# Patient Record
Sex: Female | Born: 1985 | Race: White | Hispanic: No | Marital: Single | Smoking: Never smoker
Health system: Southern US, Community
[De-identification: ages and names within clinical notes are randomized; demographics above are authoritative.]

## PROBLEM LIST (undated history)

## (undated) DIAGNOSIS — Z8619 Personal history of other infectious and parasitic diseases: Secondary | ICD-10-CM

## (undated) DIAGNOSIS — I951 Orthostatic hypotension: Secondary | ICD-10-CM

## (undated) DIAGNOSIS — I498 Other specified cardiac arrhythmias: Secondary | ICD-10-CM

## (undated) DIAGNOSIS — G90A Postural orthostatic tachycardia syndrome (POTS): Secondary | ICD-10-CM

## (undated) DIAGNOSIS — R000 Tachycardia, unspecified: Secondary | ICD-10-CM

## (undated) DIAGNOSIS — I499 Cardiac arrhythmia, unspecified: Secondary | ICD-10-CM

## (undated) HISTORY — PX: WISDOM TOOTH EXTRACTION: SHX21

## (undated) HISTORY — DX: Tachycardia, unspecified: R00.0

## (undated) HISTORY — DX: Cardiac arrhythmia, unspecified: I49.9

## (undated) HISTORY — DX: Other specified cardiac arrhythmias: I49.8

## (undated) HISTORY — DX: Postural orthostatic tachycardia syndrome (POTS): G90.A

## (undated) HISTORY — DX: Personal history of other infectious and parasitic diseases: Z86.19

## (undated) HISTORY — DX: Orthostatic hypotension: I95.1

---
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
## 2013-01-28 ENCOUNTER — Encounter: Payer: Self-pay | Admitting: Family

## 2013-01-28 ENCOUNTER — Ambulatory Visit (INDEPENDENT_AMBULATORY_CARE_PROVIDER_SITE_OTHER): Payer: BC Managed Care – PPO | Admitting: Family

## 2013-01-28 VITALS — BP 104/70 | HR 69 | Temp 98.1°F | Resp 16 | Ht 66.0 in | Wt 116.1 lb

## 2013-01-28 DIAGNOSIS — Z0000 Encounter for general adult medical examination without abnormal findings: Secondary | ICD-10-CM

## 2013-01-28 DIAGNOSIS — I499 Cardiac arrhythmia, unspecified: Secondary | ICD-10-CM

## 2013-01-28 DIAGNOSIS — R000 Tachycardia, unspecified: Secondary | ICD-10-CM

## 2013-01-28 DIAGNOSIS — G90A Postural orthostatic tachycardia syndrome (POTS): Secondary | ICD-10-CM | POA: Insufficient documentation

## 2013-01-28 DIAGNOSIS — I951 Orthostatic hypotension: Secondary | ICD-10-CM

## 2013-01-28 MED ORDER — DROSPIRENONE-ETHINYL ESTRADIOL 3-0.02 MG PO TABS: 1.0000 | ORAL_TABLET | Freq: Every day | ORAL | Status: DC

## 2013-01-28 NOTE — Assessment & Plan Note (Signed)
EKG performed today notes PAC's and incomplete RBBB. Will refer to cardiology for further evaluation.   Request old records from [facility] cardiology.

## 2013-01-28 NOTE — Progress Notes (Signed)
Subjective:    Patient ID: Debbie [name], female    DOB: June 12, 1985, 27 y.o.   MRN: [id number]  HPI  Debbie [name] is a 27 yr old female who presents today to establish care.  She is originally from [facility], but relocated from [place] where she completed PhD in philosophy. 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 She has hx of Postural orthostatic tachycardia syndrome.  This was originally diagnosed in 2011. She reports that she had an extensive evaluation performed at [facility] and was ultimately placed on 4 different medications. Reports that the medications caused her to have such severe side effects and somnolence that she ultimately did not feel that the benefits were worth the side effects.  She has been managing without meds now for some time. Notes occasional dizzinness/weakness with standing. Has increased fatigue and need for rest.  Notes that sometimes she must sit down for periods of time.  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  She is interested in establishing with a local cardiologist. She would also like to have a physical today.  Preventative care:  Immunizations: declines flu shot.   Diet: reports a healthy diet Exercise: tries to exericise 3 x a week run/walk Pap Smear: 1 yr ago- normal.  Reports that it was done by pcp. Reports no hx of abnormal pap.    Review of Systems  Constitutional: Negative for unexpected weight change.  HENT: Negative for hearing loss and rhinorrhea.   Eyes: Negative for visual disturbance.  Respiratory: Negative for cough and shortness of breath.   Cardiovascular: Negative for chest pain.  Gastrointestinal: Negative for vomiting, diarrhea and constipation.  Genitourinary: Negative for dysuria and frequency.  Musculoskeletal: Negative for arthralgias and myalgias.  Skin: Negative for rash.       Dry sensitive skin  Neurological:  Negative for headaches.  Hematological: Negative for adenopathy.  Psychiatric/Behavioral:       Denies depression/anxiety   Past Medical History  Diagnosis Date  . History of chicken pox   . POTS (postural orthostatic tachycardia syndrome)     History   Social History  . Marital Status: Single    Spouse Name: N/A    Number of Children: N/A  . Years of Education: N/A   Occupational History  . Not on file.   Social History Main Topics  . Smoking status: Never Smoker   . Smokeless tobacco: Never Used  . Alcohol Use: No  . Drug Use: Not on file  . Sexual Activity: Not on file   Other Topics Concern  . 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 Tonsillectomy and adenoidectomy  1997    Family History  Problem Relation Age of Onset  . Hyperlipidemia Father   . Heart disease Father     CAD/defibrillator age 70  . Hypertension Father   . Diabetes Father     type 2    No Known Allergies  No current outpatient prescriptions on file prior to visit.   No current facility-administered medications on file prior to visit.    BP 104/70  Pulse 69  Temp(Src) 98.1 F (36.7 C) (Oral)  Resp 16  Ht 5\' 6"  (1.676 m)  Wt 116 lb 1.3 oz (52.654 kg)  BMI 18.74 kg/m2  SpO2 99%  LMP 01/16/2013        Objective:   Physical Exam  Constitutional: She is oriented to person, place, and time. She appears well-developed and well-nourished. No distress.  HENT:  Head: Normocephalic and atraumatic.  Right Ear: Tympanic membrane and ear canal normal.  Left Ear: Tympanic membrane and ear canal normal.  Mouth/Throat: No oropharyngeal exudate, posterior oropharyngeal edema, posterior oropharyngeal erythema or tonsillar abscesses.  Cardiovascular: Normal rate.  An irregular rhythm present.  No murmur  heard. Pulmonary/Chest: Breath sounds normal. No respiratory distress. She has no wheezes. She has no rales. She exhibits no tenderness.  Genitourinary:  Bilateral breast exam is normal without discharge, masses noted.  Musculoskeletal: She exhibits no edema.  Lymphadenopathy:    She has no cervical adenopathy.  Neurological: She is alert and oriented to person, place, and time.  Skin: Skin is warm and dry.  Psychiatric: She has a normal mood and affect. Her behavior is normal. Judgment and thought content normal.          Assessment & Plan:

## 2013-01-28 NOTE — Assessment & Plan Note (Signed)
Continue healthy diet, exercise.  She will return for labs. She is afraid of risk of fainting if she comes fasting and I have told her that it is ok for her to complete non-fasting.

## 2013-01-28 NOTE — Patient Instructions (Addendum)
Please return to the lab at your convenience. You do not need to be fasting. Follow up in 1 year.

## 2013-02-04 ENCOUNTER — Ambulatory Visit: Payer: BC Managed Care – PPO | Admitting: Cardiovascular Disease

## 2013-02-11 ENCOUNTER — Encounter: Payer: Self-pay | Admitting: Cardiovascular Disease

## 2013-02-11 ENCOUNTER — Ambulatory Visit (INDEPENDENT_AMBULATORY_CARE_PROVIDER_SITE_OTHER): Payer: BC Managed Care – PPO | Admitting: Cardiovascular Disease

## 2013-02-11 ENCOUNTER — Telehealth: Payer: Self-pay | Admitting: Cardiovascular Disease

## 2013-02-11 VITALS — BP 110/82 | HR 54 | Ht 66.0 in | Wt 117.5 lb

## 2013-02-11 DIAGNOSIS — R000 Tachycardia, unspecified: Secondary | ICD-10-CM

## 2013-02-11 DIAGNOSIS — I951 Orthostatic hypotension: Secondary | ICD-10-CM

## 2013-02-11 NOTE — Progress Notes (Signed)
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 She was referred by Dr. Sandford Craze, her primary care physician, to be established in our practice because of postural orthostatic tachycardia syndrome. She was diagnosed at this age 27. She's had a positive tilt table test, echocardiogram and event monitoring. She does complain of presyncope but has not had true syncope in the past. She is feeling symptomatically daily basis with tachypalpitations, chronic fatigue and presyncope. She has been on Florinef, beta blocker and Lexapro in the past which improved her symptoms over the side effects from the medications were intolerable as well and Currently she is on no medications.   Current Outpatient Prescriptions  Medication Sig Dispense Refill  . drospirenone-ethinyl estradiol (YAZ,GIANVI,LORYNA) 3-0.02 MG tablet Take 1 tablet by mouth daily.  1 Package  11   No current facility-administered medications for this visit.    No Known Allergies  History   Social History  . Marital Status: Single    Spouse Name: N/A    Number of Children: N/A  . Years of Education: N/A   Occupational History  . Not on file.   Social History Main Topics  . Smoking status: Never Smoker   . Smokeless tobacco: Never Used  . Alcohol Use: No  . Drug Use: Not on file  . Sexual Activity: Not on file   Other Topics Concern  . 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  Cardiovascular: negative for  chest pain, dyspnea on exertion, edema, orthopnea, palpitations, paroxysmal nocturnal dyspnea or shortness of breath Dermatological: negative for rash Respiratory: negative for cough or wheezing Urologic: negative for hematuria Abdominal: negative for nausea, vomiting, diarrhea, bright red blood per rectum, melena, or hematemesis Neurologic: negative for visual changes, syncope, or dizziness All other systems reviewed and are otherwise negative except as noted above.    Blood pressure 110/82, pulse 54, height 5\' 6"  (1.676 m), weight 117 lb 8 oz (53.298 kg), last menstrual period 01/16/2013.  General appearance: alert and no distress Neck: no adenopathy, no carotid bruit, no JVD, supple, symmetrical, trachea midline and thyroid not enlarged, symmetric, no tenderness/mass/nodules Lungs: clear to auscultation bilaterally Heart: regular rate and rhythm, S1, S2 normal, no murmur, click, rub or gallop Extremities: extremities normal, atraumatic, no cyanosis or edema  EKG sinus bradycardia at 54 with incomplete right bundle branch block and left posterior fascicular block.  ASSESSMENT AND PLAN:   POTS (postural orthostatic tachycardia syndrome) Ms.  Weider  is a 27 year old fit and healthy appearing single Caucasian female who is working as a Engineer, petroleum at [employer]. She recently relocated from [place] where she received her PhD in philosophy from [employer]. She has a diagnosis of postural orthostatic tachycardia syndrome since age 27. She has had a positive tilt table test, echocardiography and Holter monitoring. She has been on medications in the past which he did not tolerate including Florinef, blocker and SSRI. She currently is on no medications.  She gets frequent symptoms including tachycardia palpitations and presyncope but has never actually had true syncope. Because of her debility and her fairly uncommon disease process I am referring her to Dr. Aron Baba , at [facility]  runs there [facility] . Clinic.      Runell Gess MD FACP,FACC,FAHA, [facility] 02/11/2013 9:44 AM

## 2013-02-11 NOTE — Patient Instructions (Signed)
Dr Allyson Sabal will see you back only as needed.  We have referred you to the POTS clinic at [facility].

## 2013-02-11 NOTE — Assessment & Plan Note (Addendum)
Debbie [name]  is a 27 year old fit and healthy appearing single Caucasian female who is working as a Engineer, petroleum at [employer]. She recently relocated from [place] where she received her PhD in philosophy from [employer]. She has a diagnosis of postural orthostatic tachycardia syndrome since age 21. She has had a positive tilt table test, echocardiography and Holter monitoring. She has been on medications in the past which he did not tolerate including Florinef, blocker and SSRI. She currently is on no medications. She gets frequent symptoms including tachycardia palpitations and presyncope but has never actually had true syncope. Because of her debility and her fairly uncommon disease process I am referring her to Dr. Aron Baba , at [facility]  runs there [facility] . Clinic.

## 2013-02-11 NOTE — Telephone Encounter (Signed)
Scheduled appt for patient in the [facility] for February 3 @ 10:00 with dr. Narda Amber. Lft msg for pt with appt time.  [facility] will mail her information,

## 2014-01-13 ENCOUNTER — Other Ambulatory Visit (HOSPITAL_COMMUNITY)
Admission: RE | Admit: 2014-01-13 | Discharge: 2014-01-13 | Disposition: A | Discharge status: Home / Self Care | Payer: BC Managed Care – PPO | Source: Ambulatory Visit | Attending: Family | Admitting: Family

## 2014-01-13 ENCOUNTER — Encounter: Payer: Self-pay | Admitting: Family

## 2014-01-13 ENCOUNTER — Other Ambulatory Visit: Payer: Self-pay | Admitting: Family

## 2014-01-13 ENCOUNTER — Ambulatory Visit (INDEPENDENT_AMBULATORY_CARE_PROVIDER_SITE_OTHER): Payer: BC Managed Care – PPO | Admitting: Family

## 2014-01-13 VITALS — BP 110/80 | HR 68 | Temp 98.1°F | Resp 16 | Ht 66.0 in | Wt 117.4 lb

## 2014-01-13 DIAGNOSIS — Z01419 Encounter for gynecological examination (general) (routine) without abnormal findings: Secondary | ICD-10-CM

## 2014-01-13 DIAGNOSIS — G90A Postural orthostatic tachycardia syndrome (POTS): Secondary | ICD-10-CM

## 2014-01-13 DIAGNOSIS — Z0000 Encounter for general adult medical examination without abnormal findings: Secondary | ICD-10-CM

## 2014-01-13 DIAGNOSIS — Z23 Encounter for immunization: Secondary | ICD-10-CM

## 2014-01-13 DIAGNOSIS — Z1151 Encounter for screening for human papillomavirus (HPV): Secondary | ICD-10-CM | POA: Insufficient documentation

## 2014-01-13 DIAGNOSIS — Z01411 Encounter for gynecological examination (general) (routine) with abnormal findings: Secondary | ICD-10-CM | POA: Diagnosis present

## 2014-01-13 DIAGNOSIS — I951 Orthostatic hypotension: Secondary | ICD-10-CM

## 2014-01-13 DIAGNOSIS — R000 Tachycardia, unspecified: Secondary | ICD-10-CM

## 2014-01-13 MED ORDER — DROSPIRENONE-ETHINYL ESTRADIOL 3-0.02 MG PO TABS: 1.0000 | ORAL_TABLET | Freq: Every day | ORAL | Status: DC

## 2014-01-13 NOTE — Assessment & Plan Note (Signed)
Continue healthy diet, exercise. Declines lab work today.  Flu shot today.

## 2014-01-13 NOTE — Progress Notes (Signed)
Pre visit review using our clinic review tool, if applicable. No additional management support is needed unless otherwise documented below in the visit note. 

## 2014-01-13 NOTE — Assessment & Plan Note (Signed)
Seems to be doing better on ritalin- this is being prescribed by sleep specialist.

## 2014-01-13 NOTE — Patient Instructions (Signed)
Please follow up in 1 year, sooner if problems/concerns.  

## 2014-01-13 NOTE — Progress Notes (Signed)
Subjective:    Patient ID: Debbie [name], female    DOB: November 30, 1985, 30 y.o.   MRN: [id number]  HPI  Pt here for non-fasting physical. Pt up to date with tetanus. Undecided about flu vaccine. Would like pap smeOvidio Hangerar today. Last EKG by us 01/28/13.   POTS- saw Dr. Allyson SabalBerry, [facility]   [facility].  Went in March and saw Dr. Lytle MichaelsAdam Perlman.  Recommended acupuncture- was healthy.  [facility]  He recommended stimulant to keep her awake.  Did not start.  Reports that when she returned to work. Reports less dizzy, less brain fog.  Feels like she is funcitoning better.  Dr.  Quintella ReichertHoly Forrester Miler.  Working on removing anxiety.   Patient presents today for complete physical.  Immunizations: tetanus up to date Diet: healthy Exercise: not exercising regularly due to POTS Pap Smear: 2 years ago Mammogram:     Review of Systems  Constitutional:       Wt Readings from Last 3 Encounters: 01/13/14 : 117 lb 6.4 oz (53.252 kg) 02/11/13 : 117 lb 8 oz (53.298 kg) 01/28/13 : 116 lb 1.3 oz (52.654 kg)   HENT: Positive for rhinorrhea. Negative for hearing loss.   Eyes: Negative for visual disturbance.  Respiratory: Negative for cough and shortness of breath.   Cardiovascular: Negative for chest pain.  Gastrointestinal:       Mild nausea associated with pots  Genitourinary: Negative for dysuria, frequency and menstrual problem.  Musculoskeletal: Negative for arthralgias and myalgias.  Neurological: Negative for headaches.  Hematological: Negative for adenopathy.  Psychiatric/Behavioral:       Denies depression   Past Medical History  Diagnosis Date  . History of chicken pox   . POTS (postural orthostatic tachycardia syndrome)     History   Social History  . Marital Status: Single    Spouse Name: N/A    Number of Children: N/A  . Years of Education: N/A   Occupational History  . Not on file.   Social History Main  Topics  . Smoking status: Never Smoker   . Smokeless tobacco: Never Used  . Alcohol Use: No  . Drug Use: Not on file  . Sexual Activity: Not on file   Other Topics Concern  . 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 Tonsillectomy and adenoidectomy  1997    Family History  Problem Relation Age of Onset  . Hyperlipidemia Father   . Heart disease Father     CAD/defibrillator age 30  . Hypertension Father   . Diabetes Father     type 2    No Known Allergies  Current Outpatient Prescriptions on File Prior to Visit  Medication Sig Dispense Refill  . drospirenone-ethinyl estradiol (YAZ,GIANVI,LORYNA) 3-0.02 MG tablet Take 1 tablet by mouth daily.  1 Package  11   No current facility-administered medications on file prior to visit.    BP 110/80  Pulse 68  Temp(Src) 98.1 F (36.7 C) (Oral)  Resp 16  Ht 5\' 6"  (1.676 m)  Wt 117 lb 6.4 oz (53.252 kg)  BMI 18.96 kg/m2  SpO2 100%  LMP 12/16/2013        Objective:  Physical Exam Physical Exam  Constitutional: She is oriented to person, place, and time. She appears well-developed and well-nourished. No distress.  HENT:  Head: Normocephalic and atraumatic.  Right Ear: Tympanic membrane and ear canal normal.  Left Ear: Tympanic membrane and ear canal normal.  Mouth/Throat: Oropharynx is clear and moist.  Eyes: Pupils are equal, round, and reactive to light. No scleral icterus.  Neck: Normal range of motion. No thyromegaly present.  Cardiovascular: Normal rate and regular rhythm.   No murmur heard. Pulmonary/Chest: Effort normal and breath sounds normal. No respiratory distress. He has no wheezes. She has no rales. She exhibits no tenderness.  Abdominal: Soft. Bowel sounds are normal. He exhibits no distension and no mass. There is no  tenderness. There is no rebound and no guarding.  Musculoskeletal: She exhibits no edema.  Lymphadenopathy:    She has no cervical adenopathy.  Neurological: She is alert and oriented to person, place, and time. She has normal reflexes. She exhibits normal muscle tone. Coordination normal.  Skin: Skin is warm and dry.  Psychiatric: She has a normal mood and affect. Her behavior is normal. Judgment and thought content normal.  Breasts: Examined lying Right: Without masses, retractions, discharge or axillary adenopathy.  Left: Without masses, retractions, discharge or axillary adenopathy.  Inguinal/mons: Normal without inguinal adenopathy  External genitalia: Normal  BUS/Urethra/Skene's glands: Normal  Bladder: Normal  Vagina: Normal  Cervix: Normal  Uterus: normal in size, shape and contour. Midline and mobile  Adnexa/parametria:  Rt: Without masses or tenderness.  Lt: Without masses or tenderness.  Anus and perineum: Normal           Assessment & Plan:          Assessment & Plan:

## 2014-01-13 NOTE — Addendum Note (Signed)
Addended by: Mervin KungFERGERSON, Annelyse Rey A on: 01/13/2014 04:13 PM   Modules accepted: Orders

## 2014-01-15 LAB — CYTOLOGY - PAP

## 2014-01-17 ENCOUNTER — Encounter: Payer: Self-pay | Admitting: Family

## 2014-01-17 DIAGNOSIS — R87610 Atypical squamous cells of undetermined significance on cytologic smear of cervix (ASC-US): Secondary | ICD-10-CM | POA: Insufficient documentation

## 2014-01-22 ENCOUNTER — Telehealth: Payer: Self-pay | Admitting: Family

## 2014-01-22 NOTE — Telephone Encounter (Signed)
Pt returing call. See below.   Notes Recorded by Wende MottGaye H Foster, RN on 01/22/2014 at 11:14 AM LM for patient to return the call Notes Recorded by Kathi Simpersricia A Fergerson, CMA on 01/20/2014 at 7:14 PM Left message for pt to return my call. Notes Recorded by Sandford CrazeMelissa O'Sullivan, NP on 01/17/2014 at 4:13 PM Please contact pt and let her know that her pap is abnormal, however HPV testing is negative which is good. She should repeat her Pap in 1 year

## 2014-01-22 NOTE — Telephone Encounter (Signed)
A user error has taken place.

## 2014-01-22 NOTE — Telephone Encounter (Signed)
Advised patient per PCP notes. Patient voices understanding.

## 2014-01-24 ENCOUNTER — Telehealth: Payer: Self-pay | Admitting: *Deleted

## 2014-01-24 NOTE — Telephone Encounter (Signed)
Left detailed message below # and to call if any questions.     Jonathon JordanStephanie S Stevens Tesha Archambeau A Anis Degidio, CMA            Patient states that she is returning your phone call from a couple of days ago.[phone]   She states that it is ok to leave a detailed message.

## 2014-01-24 NOTE — Telephone Encounter (Signed)
Message copied by Kathi SimpersFERGERSON, Callin Ashe A on Fri Jan 24, 2014  7:50 AM ------      Message from: O'SULLIVAN, MELISSA      Created: Fri Jan 17, 2014  4:13 PM       Please contact pt and let her know that her pap is abnormal, however HPV testing is negative which is good. She should repeat her Pap in 1 year. ------

## 2014-12-12 ENCOUNTER — Telehealth: Payer: Self-pay | Admitting: *Deleted

## 2014-12-12 NOTE — Telephone Encounter (Signed)
Unable to reach patient at time of Pre-Visit Call.  Left message for patient to return call when available.    

## 2014-12-15 ENCOUNTER — Encounter: Payer: Self-pay | Admitting: Family

## 2014-12-15 ENCOUNTER — Other Ambulatory Visit (HOSPITAL_COMMUNITY)
Admission: RE | Admit: 2014-12-15 | Discharge: 2014-12-15 | Disposition: A | Discharge status: Home / Self Care | Payer: BLUE CROSS/BLUE SHIELD | Source: Ambulatory Visit | Attending: Family | Admitting: Family

## 2014-12-15 ENCOUNTER — Ambulatory Visit (INDEPENDENT_AMBULATORY_CARE_PROVIDER_SITE_OTHER): Payer: BLUE CROSS/BLUE SHIELD | Admitting: Family

## 2014-12-15 VITALS — BP 122/76 | HR 84 | Temp 97.6°F | Resp 16 | Ht 65.5 in | Wt 119.0 lb

## 2014-12-15 DIAGNOSIS — Z1151 Encounter for screening for human papillomavirus (HPV): Secondary | ICD-10-CM | POA: Diagnosis not present

## 2014-12-15 DIAGNOSIS — Z01419 Encounter for gynecological examination (general) (routine) without abnormal findings: Secondary | ICD-10-CM

## 2014-12-15 DIAGNOSIS — Z23 Encounter for immunization: Secondary | ICD-10-CM

## 2014-12-15 DIAGNOSIS — Z0000 Encounter for general adult medical examination without abnormal findings: Secondary | ICD-10-CM

## 2014-12-15 DIAGNOSIS — Z7189 Other specified counseling: Secondary | ICD-10-CM

## 2014-12-15 DIAGNOSIS — Z01411 Encounter for gynecological examination (general) (routine) with abnormal findings: Secondary | ICD-10-CM | POA: Insufficient documentation

## 2014-12-15 MED ORDER — DROSPIRENONE-ETHINYL ESTRADIOL 3-0.02 MG PO TABS: 1.0000 | ORAL_TABLET | Freq: Every day | ORAL | Status: DC

## 2014-12-15 NOTE — Assessment & Plan Note (Signed)
Flu shot today.  Immunizations reviewed and up to date.  Continue healthy diet, exercise.  Obtain routine labs.

## 2014-12-15 NOTE — Addendum Note (Signed)
Addended by: Mervin Kung A on: 12/15/2014 07:08 PM   Modules accepted: Orders

## 2014-12-15 NOTE — Progress Notes (Signed)
Pre visit review using our clinic review tool, if applicable. No additional management support is needed unless otherwise documented below in the visit note. 

## 2014-12-15 NOTE — Progress Notes (Signed)
Subjective:    Patient ID: Debbie [name], female    DOB: 1985/04/21, 29 y.o.   MRN: [id number]  HPI   Patient presents today for complete physical.  Immunizations: tetanus 2012, would like flu shot today Diet:  Reports healthy diet Exercise:yes, but not as much as she was over the summer.   Pap Smear: ASCUS 10/15, HPV negative Dental: up to date Eye exam: last October   Wt Readings from Last 3 Encounters:  12/15/14 119 lb (53.978 kg)  01/13/14 117 lb 6.4 oz (53.252 kg)  02/11/13 117 lb 8 oz (53.298 kg)   Reports fatigue/pots much improved since her neurologist started her on ritalin.   Review of Systems  Constitutional: Negative for unexpected weight change.  HENT: Negative for hearing loss and rhinorrhea.   Eyes: Negative for visual disturbance.  Respiratory: Negative for cough.   Cardiovascular: Negative for leg swelling.  Gastrointestinal: Negative for nausea, diarrhea and constipation.  Genitourinary: Negative for dysuria and frequency.  Musculoskeletal: Negative for myalgias and arthralgias.  Skin: Negative for rash.  Neurological: Negative for headaches.  Hematological: Negative for adenopathy.  Psychiatric/Behavioral: Negative for dysphoric mood and agitation.       Past Medical History  Diagnosis Date  . History of chicken pox   . POTS (postural orthostatic tachycardia syndrome)     Social History   Social History  . Marital Status: Single    Spouse Name: N/A  . Number of Children: N/A  . Years of Education: N/A   Occupational History  . Not on file.   Social History Main Topics  . Smoking status: Never Smoker   . Smokeless tobacco: Never Used  . Alcohol Use: No  . Drug Use: Not on file  . Sexual Activity: Not on file   Other Topics Concern  . 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 Tonsillectomy and adenoidectomy  1997    Family History  Problem Relation Age of Onset  . Hyperlipidemia Father   . Heart disease Father     CAD/defibrillator age 2  . Hypertension Father   . Diabetes Father     type 2    No Known Allergies  Current Outpatient Prescriptions on File Prior to Visit  Medication Sig Dispense Refill  . drospirenone-ethinyl estradiol (YAZ,GIANVI,LORYNA) 3-0.02 MG tablet Take 1 tablet by mouth daily. 3 Package 3  . methylphenidate (RITALIN) 10 MG tablet Take 10 mg by mouth 2 (two) times daily.      No current facility-administered medications on file prior to visit.    BP 122/76 mmHg  Pulse 84  Temp(Src) 97.6 F (36.4 C) (Oral)  Resp 16  Ht 5' 5.5" (1.664 m)  Wt 119 lb (53.978 kg)  BMI 19.49 kg/m2  SpO2 99%  LMP 11/19/2014    Objective:   Physical Exam Physical Exam  Constitutional: She is oriented to person, place, and time. She appears well-developed and well-nourished. No distress.  HENT:  Head: Normocephalic and atraumatic.  Right Ear: Tympanic membrane and ear canal normal.  Left Ear: Tympanic membrane and ear canal normal.  Mouth/Throat: Oropharynx is clear and moist.  Eyes: Pupils are equal, round, and reactive to light. No scleral icterus.  Neck: Normal range of motion. No  thyromegaly present.  Cardiovascular: Normal rate and regular rhythm.   No murmur heard. Pulmonary/Chest: Effort normal and breath sounds normal. No respiratory distress. He has no wheezes. She has no rales. She exhibits no tenderness.  Abdominal: Soft. Bowel sounds are normal. He exhibits no distension and no mass. There is no tenderness. There is no rebound and no guarding.  Musculoskeletal: She exhibits no edema.  Lymphadenopathy:    She has no cervical adenopathy.  Neurological: She is alert and oriented to person, place, and time. She has normal patellar reflexes. She exhibits normal muscle tone. Coordination normal.   Skin: Skin is warm and dry.  Psychiatric: She has a normal mood and affect. Her behavior is normal. Judgment and thought content normal.  Breasts: Examined lying Right: Without masses, retractions, discharge or axillary adenopathy.  Left: Without masses, retractions, discharge or axillary adenopathy.  Inguinal/mons: Normal without inguinal adenopathy  External genitalia: Normal  BUS/Urethra/Skene's glands: Normal  Bladder: Normal  Vagina: Normal  Cervix: Normal  Uterus: normal in size, shape and contour. Midline and mobile  Adnexa/parametria:  Rt: Without masses or tenderness.  Lt: Without masses or tenderness.  Anus and perineum: Normal           Assessment & Plan:          Assessment & Plan:

## 2014-12-15 NOTE — Assessment & Plan Note (Signed)
Repeat Pap today

## 2014-12-15 NOTE — Patient Instructions (Signed)
Please complete lab work at [facility]. Follow up in 1 year, sooner if problems/concerns.

## 2014-12-17 LAB — CYTOLOGY - PAP

## 2014-12-18 ENCOUNTER — Encounter: Payer: Self-pay | Admitting: Family

## 2015-09-24 ENCOUNTER — Ambulatory Visit (INDEPENDENT_AMBULATORY_CARE_PROVIDER_SITE_OTHER): Payer: BLUE CROSS/BLUE SHIELD | Admitting: Family

## 2015-09-24 ENCOUNTER — Encounter: Payer: Self-pay | Admitting: Family

## 2015-09-24 VITALS — BP 120/86 | HR 70 | Temp 98.2°F | Resp 16 | Ht 65.5 in | Wt 119.6 lb

## 2015-09-24 DIAGNOSIS — H1010 Acute atopic conjunctivitis, unspecified eye: Secondary | ICD-10-CM | POA: Diagnosis not present

## 2015-09-24 NOTE — Patient Instructions (Signed)
Add zyrtec 10mg  once daily.   Keep your upcoming appointment with opthalmology.  Call if symptoms worsen or do not improve.

## 2015-09-24 NOTE — Progress Notes (Signed)
Pre visit review using our clinic review tool, if applicable. No additional management support is needed unless otherwise documented below in the visit note. 

## 2015-09-24 NOTE — Progress Notes (Signed)
Subjective:    Patient ID: Debbie [name], female    DOB: 01-15-1986, 30 y.o.   MRN: [id number]  HPI  Debbie [name] is a 30 yr old female who presents today to discuss intermittent swelling/redness of her eyelids.  This has been present x 1 week.  Using prenisolone acetate drops with brief improvement.  Reports sore throat, worse with rain.  Symptoms started in March.  Felt that it might be related to pollen.  She has an appointment with opthalmology in 3 weeks. HAs had some swelling/redness of her eyelids on Monday. Mild itching.  Couldn't get in with optometrist.   Denies sneezing, no runny nose, no fever.  Denies allergies in the past.  She has lived in [place] x 3 years, prior to that was in [place] 7 years ago.  She has not tried any otc antihistamines because she was concerned about potential interaction with ritalin.   Review of Systems See HPI  Past Medical History  Diagnosis Date  . History of chicken pox   . POTS (postural orthostatic tachycardia syndrome)      Social History   Social History  . Marital Status: Single    Spouse Name: N/A  . Number of Children: N/A  . Years of Education: N/A   Occupational History  . Not on file.   Social History Main Topics  . Smoking status: Never Smoker   . Smokeless tobacco: Never Used  . Alcohol Use: No  . Drug Use: Not on file  . Sexual Activity: Not on file   Other Topics Concern  . 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 Tonsillectomy and adenoidectomy  1997    Family History  Problem Relation Age of Onset  . Hyperlipidemia Father   . Heart disease Father     CAD/defibrillator age 32  . Hypertension Father   . Diabetes Father     type 2    No Known Allergies  Current Outpatient Prescriptions on File Prior to Visit    Medication Sig Dispense Refill  . drospirenone-ethinyl estradiol (YAZ,GIANVI,LORYNA) 3-0.02 MG tablet Take 1 tablet by mouth daily. 3 Package 3  . methylphenidate (RITALIN) 10 MG tablet Take 10 mg by mouth 2 (two) times daily.      No current facility-administered medications on file prior to visit.    BP 120/86 mmHg  Pulse 70  Temp(Src) 98.2 F (36.8 C) (Oral)  Resp 16  Ht 5' 5.5" (1.664 m)  Wt 119 lb 9.6 oz (54.25 kg)  BMI 19.59 kg/m2  SpO2 100%  LMP 09/23/2015       Objective:   Physical Exam  Constitutional: She is oriented to person, place, and time. She appears well-developed and well-nourished.  HENT:  Right Ear: Tympanic membrane and ear canal normal.  Left Ear: Tympanic membrane and ear canal normal.  Mouth/Throat: No oropharyngeal exudate, posterior oropharyngeal edema or posterior oropharyngeal erythema.  Eyes: EOM are normal. Pupils are equal, round, and reactive to light. Right eye exhibits no discharge. Left eye exhibits no discharge. Right conjunctiva is not injected. Left conjunctiva is not injected.  R eyelid has some irritation. No eyelid swelling today  Cardiovascular: Normal rate, regular rhythm and normal heart sounds.   No murmur heard. Pulmonary/Chest:  Effort normal and breath sounds normal. No respiratory distress. She has no wheezes.  Neurological: She is alert and oriented to person, place, and time.  Skin: Skin is warm and dry.  Psychiatric: She has a normal mood and affect. Her behavior is normal. Judgment and thought content normal.          Assessment & Plan:  Allergic conjunctivitis-  Advised pt to keep her upcoming appointment with opthalmology, begin trial of zyrtec once daily. Call if symptoms worsen or do not improve.

## 2015-10-21 ENCOUNTER — Encounter (HOSPITAL_COMMUNITY): Payer: Self-pay | Admitting: Emergency Medicine

## 2015-10-21 ENCOUNTER — Emergency Department (HOSPITAL_COMMUNITY)
Admission: EM | Admit: 2015-10-21 | Discharge: 2015-10-21 | Disposition: A | Discharge status: Home / Self Care | Payer: BLUE CROSS/BLUE SHIELD | Attending: Emergency Medicine | Admitting: Emergency Medicine

## 2015-10-21 DIAGNOSIS — Y999 Unspecified external cause status: Secondary | ICD-10-CM | POA: Insufficient documentation

## 2015-10-21 DIAGNOSIS — S80862D Insect bite (nonvenomous), left lower leg, subsequent encounter: Secondary | ICD-10-CM | POA: Insufficient documentation

## 2015-10-21 DIAGNOSIS — Y9302 Activity, running: Secondary | ICD-10-CM | POA: Insufficient documentation

## 2015-10-21 DIAGNOSIS — W57XXXD Bitten or stung by nonvenomous insect and other nonvenomous arthropods, subsequent encounter: Secondary | ICD-10-CM | POA: Diagnosis not present

## 2015-10-21 DIAGNOSIS — Y929 Unspecified place or not applicable: Secondary | ICD-10-CM | POA: Diagnosis not present

## 2015-10-21 DIAGNOSIS — T6391XD Toxic effect of contact with unspecified venomous animal, accidental (unintentional), subsequent encounter: Secondary | ICD-10-CM

## 2015-10-21 MED ORDER — CEPHALEXIN 500 MG PO CAPS: 500.0000 mg | ORAL_CAPSULE | Freq: Four times a day (QID) | ORAL | Status: DC

## 2015-10-21 MED ORDER — TRIAMCINOLONE ACETONIDE 0.1 % EX CREA: 1.0000 "application " | TOPICAL_CREAM | Freq: Two times a day (BID) | CUTANEOUS | Status: DC

## 2015-10-21 NOTE — ED Provider Notes (Signed)
CSN: [id number]     Arrival date & time 10/21/15  1534 History  By signing my name below, I, Debbie [name], attest that this documentation has been prepared under the direction and in the presence of Shawn Joy, PA-C. Electronically Signed: Bridgette [name], ED Scribe. 10/21/2015. 4:24 PM.   Chief Complaint  Patient presents with  . Insect Bite   The history is provided by the patient. No language interpreter was used.   HPI Comments: Debbie [name] is a 30 y.o. female who presents to the Emergency Department complaining of a pruritic insect bite on her posterior left lower leg onset 4 days ago. Pt reports she was out running and was stung by an insect. She went to the Urgent Care yesterday and was given Bactrim because of suspected cellulitis. Pt came to the ED to verify that the medication is working because she is leaving in 2 days for a trip. Pt notes that she doesn't know if the redness has spread from when she started taking antibiotics one day ago but notes there is no overall improvement. Patient denies fever/chills, nausea/vomiting, neurologic deficits, or any other complaints.      Past Medical History  Diagnosis Date  . History of chicken pox   . POTS (postural orthostatic tachycardia syndrome)    Past Surgical History  Procedure Laterality Date  . Tonsillectomy and adenoidectomy  1997   Family History  Problem Relation Age of Onset  . Hyperlipidemia Father   . Heart disease Father     CAD/defibrillator age 7  . Hypertension Father   . Diabetes Father     type 2   Social History  Substance Use Topics  . Smoking status: Never Smoker   . Smokeless tobacco: Never Used  . Alcohol Use: No   OB History    No data available     Review of Systems  Constitutional: Negative for fever.  Respiratory: Negative for cough.   Skin: Positive for color change and wound.      Allergies  Review of patient's allergies indicates no known allergies.  Home Medications   Prior  to Admission medications   Medication Sig Start Date End Date Taking? Authorizing Provider  cephALEXin (KEFLEX) 500 MG capsule Take 1 capsule (500 mg total) by mouth 4 (four) times daily. 10/21/15   Shawn C Joy, PA-C  drospirenone-ethinyl estradiol (YAZ,GIANVI,LORYNA) 3-0.02 MG tablet Take 1 tablet by mouth daily. 12/15/14   Sandford Craze, NP  methylphenidate (RITALIN) 10 MG tablet Take 10 mg by mouth 2 (two) times daily.  01/09/14   Historical Provider, MD  triamcinolone cream (KENALOG) 0.1 % Apply 1 application topically 2 (two) times daily. 10/21/15   Shawn C Joy, PA-C   BP 148/88 mmHg  Pulse 94  Temp(Src) 98.2 F (36.8 C) (Oral)  Resp 18  SpO2 100%  LMP 09/23/2015 Physical Exam  Constitutional: She appears well-developed and well-nourished. No distress.  HENT:  Head: Normocephalic and atraumatic.  Eyes: Conjunctivae are normal.  Neck: Neck supple.  Cardiovascular: Normal rate, regular rhythm and intact distal pulses.   Pulmonary/Chest: Effort normal. No respiratory distress.  Abdominal: She exhibits no distension.  Musculoskeletal: Normal range of motion. She exhibits tenderness.  Neurological: She is alert.  Skin: Skin is warm and dry. She is not diaphoretic. There is erythema.  Erythema and tenderness to the posterior left lower leg.   Psychiatric: She has a normal mood and affect. Her behavior is normal.  Nursing note and vitals reviewed.   ED  Course  Procedures  DIAGNOSTIC STUDIES: Oxygen Saturation is 100% on RA, normal by my interpretation.    COORDINATION OF CARE: 4:21 PM Discussed treatment plan with pt at bedside which includes Rx of Keflex and pt agreed to plan.   MDM   Final diagnoses:  Sting, subsequent encounter    Debbie [name] presents for recheck of her insect sting and subsequent cellulitis.  I agree with previous assessment of cellulitis. Plan to add Keflex to the patient's Bactrim. The patient was given instructions for home care as well  as return precautions. Patient voices understanding of these instructions, accepts the plan, and is comfortable with discharge.  I personally performed the services described in this documentation, which was scribed in my presence. The recorded information has been reviewed and is accurate.   Anselm PancoastShawn C Joy, PA-C 10/23/15 0038   Cathren LaineKevin Steinl, MD 10/27/15 1257

## 2015-10-21 NOTE — Discharge Instructions (Signed)
You have been seen today for a recheck of an insect sting.   1. Antibiotics: Continue to take the Bactrim as prescribed. Add the Keflex to this regimen.  Please take all of your antibiotics until finished!   You may develop abdominal discomfort or diarrhea from the antibiotic.  You may help offset this with probiotics which you can buy or get in yogurt. Do not eat or take the probiotics until 2 hours after your antibiotic.  2. Steroid cream: Use the Kenalog as needed to reduce inflammation and itching.  Follow up with PCP as needed should symptoms fail to resolve. Return to ED should symptoms worsen.

## 2015-10-21 NOTE — ED Notes (Signed)
Per patient, she was out running and was stung by an insect on the leg lower leg, posteriorly.  She visited the Urgent Care yesterday and given antibiotics and want to verify that the medication is working. She is leaving on Friday for a trip and wanting to make sure everything is good with her left leg.

## 2015-11-02 ENCOUNTER — Encounter: Payer: Self-pay | Admitting: Family

## 2015-11-02 ENCOUNTER — Other Ambulatory Visit (HOSPITAL_COMMUNITY)
Admission: RE | Admit: 2015-11-02 | Discharge: 2015-11-02 | Disposition: A | Discharge status: Home / Self Care | Payer: BLUE CROSS/BLUE SHIELD | Source: Ambulatory Visit | Attending: Family | Admitting: Family

## 2015-11-02 ENCOUNTER — Ambulatory Visit (INDEPENDENT_AMBULATORY_CARE_PROVIDER_SITE_OTHER): Payer: BLUE CROSS/BLUE SHIELD | Admitting: Family

## 2015-11-02 VITALS — BP 130/80 | HR 83 | Temp 98.0°F | Resp 16 | Ht 66.0 in | Wt 118.6 lb

## 2015-11-02 DIAGNOSIS — Z0000 Encounter for general adult medical examination without abnormal findings: Secondary | ICD-10-CM

## 2015-11-02 DIAGNOSIS — Z01419 Encounter for gynecological examination (general) (routine) without abnormal findings: Secondary | ICD-10-CM | POA: Diagnosis not present

## 2015-11-02 DIAGNOSIS — Z113 Encounter for screening for infections with a predominantly sexual mode of transmission: Secondary | ICD-10-CM

## 2015-11-02 LAB — HIV ANTIBODY (ROUTINE TESTING W REFLEX): HIV: NONREACTIVE

## 2015-11-02 MED ORDER — DROSPIRENONE-ETHINYL ESTRADIOL 3-0.02 MG PO TABS: 1.0000 | ORAL_TABLET | Freq: Every day | ORAL | 3 refills | Status: DC

## 2015-11-02 NOTE — Progress Notes (Signed)
Subjective:    Patient ID: Debbie [name], female    DOB: 11-19-1985, 30 y.o.   MRN: [id number]  HPI  Ms. Debbie [name] is a 30 yr old female who presents today for cpx.    Immunizations: tetanus up to date Diet: healthy Exercise: 3-4 times a week run/walk combo Pap Smear: 12/15/14, ASCUS, HPV negative Wt Readings from Last 3 Encounters:  11/02/15 118 lb 9.6 oz (53.8 kg)  09/24/15 119 lb 9.6 oz (54.3 kg)  12/15/14 119 lb (54 kg)      Review of Systems  Constitutional: Negative for unexpected weight change.  HENT: Negative for hearing loss and rhinorrhea.   Eyes: Negative for visual disturbance.  Respiratory: Negative for cough and shortness of breath.   Cardiovascular: Negative for chest pain.  Gastrointestinal: Negative for constipation and diarrhea.  Genitourinary: Negative for dysuria and frequency.  Musculoskeletal: Negative for arthralgias and myalgias.  Skin: Negative for rash.  Neurological: Negative for headaches.  Hematological: Negative for adenopathy.  Psychiatric/Behavioral:       Denies depression/anxiety   Past Medical History:  Diagnosis Date  . History of chicken pox   . POTS (postural orthostatic tachycardia syndrome)      Social History   Social History  . Marital status: Single    Spouse name: N/A  . Number of children: N/A  . Years of education: N/A   Occupational History  . Not on file.   Social History Main Topics  . Smoking status: Never Smoker  . Smokeless tobacco: Never Used  . Alcohol use No  . Drug use: No  . Sexual activity: Yes    Birth control/ protection: Pill   Other Topics Concern  . 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 TONSILLECTOMY AND ADENOIDECTOMY  1997    Family History  Problem Relation Age of Onset  . Osteoporosis  Mother   . Hyperlipidemia Father   . Heart disease Father     CAD/defibrillator age 37  . Hypertension Father   . Diabetes Father     type 2    No Known Allergies  Current Outpatient Prescriptions on File Prior to Visit  Medication Sig Dispense Refill  . drospirenone-ethinyl estradiol (YAZ,GIANVI,LORYNA) 3-0.02 MG tablet Take 1 tablet by mouth daily. 3 Package 3  . methylphenidate (RITALIN) 10 MG tablet Take 10 mg by mouth 2 (two) times daily.      No current facility-administered medications on file prior to visit.     BP 130/80   Pulse 83   Temp 98 F (36.7 C) (Oral)   Resp 16   Ht 5\' 6"  (1.676 m)   Wt 118 lb 9.6 oz (53.8 kg)   LMP 10/21/2015   SpO2 98% Comment: room air  BMI 19.14 kg/m        Objective:   Physical Exam Physical Exam  Constitutional: She is oriented to person, place, and time. She appears well-developed and well-nourished. No distress.  HENT:  Head: Normocephalic and atraumatic.  Right Ear: Tympanic membrane and ear canal normal.  Left Ear: Tympanic membrane and ear canal normal.  Mouth/Throat: Oropharynx is clear and moist.  Eyes: Pupils are equal, round, and reactive to light. No scleral icterus.  Neck: Normal range of motion. No  thyromegaly present.  Cardiovascular: Normal rate and regular rhythm.   No murmur heard. Pulmonary/Chest: Effort normal and breath sounds normal. No respiratory distress. He has no wheezes. She has no rales. She exhibits no tenderness.  Abdominal: Soft. Bowel sounds are normal. She exhibits no distension and no mass. There is no tenderness. There is no rebound and no guarding.  Musculoskeletal: She exhibits no edema.  Lymphadenopathy:    She has no cervical adenopathy.  Neurological: She is alert and oriented to person, place, and time. She has normal patellar reflexes. She exhibits normal muscle tone. Coordination normal.  Skin: Skin is warm and dry.  Psychiatric: She has a normal mood and affect. Her behavior is  normal. Judgment and thought content normal.  Breasts: Examined lying Right: Without masses, retractions, discharge or axillary adenopathy.  Left: Without masses, retractions, discharge or axillary adenopathy.  Inguinal/mons: Normal without inguinal adenopathy  External genitalia: Normal  BUS/Urethra/Skene's glands: Normal  Bladder: Normal  Vagina: Normal  Cervix: Normal  Uterus: normal in size, shape and contour. Midline and mobile  Adnexa/parametria:  Rt: Without masses or tenderness.  Lt: Without masses or tenderness.  Anus and perineum: Normal           Assessment & Plan:          Assessment & Plan:  Preventative care- encouraged her to continue healthy diet, exercise. Obtain routine lab work. Repeat Pap (ASCUS HPV negative last year). If still abnormal, plan GYN referral.

## 2015-11-02 NOTE — Addendum Note (Signed)
Addended by: Mervin Kung A on: 11/02/2015 04:17 PM   Modules accepted: Orders

## 2015-11-02 NOTE — Progress Notes (Signed)
Pre visit review using our clinic review tool, if applicable. No additional management support is needed unless otherwise documented below in the visit note. 

## 2015-11-02 NOTE — Patient Instructions (Signed)
Continue healthy diet, exercise. Complete lab work prior to leaving.  

## 2015-11-03 LAB — CBC WITH DIFFERENTIAL/PLATELET
BASOS ABS: 0.1 10*3/uL (ref 0.0–0.1)
Basophils Relative: 1.3 % (ref 0.0–3.0)
EOS ABS: 0.1 10*3/uL (ref 0.0–0.7)
Eosinophils Relative: 0.9 % (ref 0.0–5.0)
HEMATOCRIT: 40 % (ref 36.0–46.0)
Hemoglobin: 13.5 g/dL (ref 12.0–15.0)
LYMPHS ABS: 2.3 10*3/uL (ref 0.7–4.0)
LYMPHS PCT: 39.2 % (ref 12.0–46.0)
MCHC: 33.8 g/dL (ref 30.0–36.0)
MCV: 87 fl (ref 78.0–100.0)
MONO ABS: 0.4 10*3/uL (ref 0.1–1.0)
Monocytes Relative: 6.4 % (ref 3.0–12.0)
NEUTROS ABS: 3 10*3/uL (ref 1.4–7.7)
NEUTROS PCT: 52.2 % (ref 43.0–77.0)
PLATELETS: 217 10*3/uL (ref 150.0–400.0)
RBC: 4.6 Mil/uL (ref 3.87–5.11)
RDW: 12.6 % (ref 11.5–15.5)
WBC: 5.8 10*3/uL (ref 4.0–10.5)

## 2015-11-03 LAB — URINALYSIS, ROUTINE W REFLEX MICROSCOPIC
Bilirubin Urine: NEGATIVE
Hgb urine dipstick: NEGATIVE
KETONES UR: NEGATIVE
Leukocytes, UA: NEGATIVE
Nitrite: NEGATIVE
PH: 7 (ref 5.0–8.0)
RBC / HPF: NONE SEEN (ref 0–?)
SPECIFIC GRAVITY, URINE: 1.015 (ref 1.000–1.030)
Total Protein, Urine: NEGATIVE
UROBILINOGEN UA: 0.2 (ref 0.0–1.0)
Urine Glucose: NEGATIVE
WBC UA: NONE SEEN (ref 0–?)

## 2015-11-03 LAB — TSH: TSH: 1.77 u[IU]/mL (ref 0.35–4.50)

## 2015-11-03 LAB — LIPID PANEL
CHOL/HDL RATIO: 3
Cholesterol: 221 mg/dL — ABNORMAL HIGH (ref 0–200)
HDL: 88.4 mg/dL (ref >39.00)
LDL CALC: 120 mg/dL — AB (ref 0–99)
NONHDL: 132.9
TRIGLYCERIDES: 67 mg/dL (ref 0.0–149.0)
VLDL: 13.4 mg/dL (ref 0.0–40.0)

## 2015-11-03 LAB — BASIC METABOLIC PANEL
BUN: 18 mg/dL (ref 6–23)
CALCIUM: 9.3 mg/dL (ref 8.4–10.5)
CO2: 27 meq/L (ref 19–32)
CREATININE: 0.69 mg/dL (ref 0.40–1.20)
Chloride: 103 mEq/L (ref 96–112)
GFR: 104.68 mL/min (ref >60.00)
Glucose, Bld: 81 mg/dL (ref 70–99)
Potassium: 3.8 mEq/L (ref 3.5–5.1)
Sodium: 138 mEq/L (ref 135–145)

## 2015-11-03 LAB — HEPATIC FUNCTION PANEL
ALK PHOS: 44 U/L (ref 39–117)
ALT: 17 U/L (ref 0–35)
AST: 21 U/L (ref 0–37)
Albumin: 4.5 g/dL (ref 3.5–5.2)
BILIRUBIN DIRECT: 0.1 mg/dL (ref 0.0–0.3)
BILIRUBIN TOTAL: 0.5 mg/dL (ref 0.2–1.2)
Total Protein: 7.2 g/dL (ref 6.0–8.3)

## 2015-11-05 LAB — CYTOLOGY - PAP

## 2015-11-06 ENCOUNTER — Encounter: Payer: Self-pay | Admitting: Family

## 2017-01-11 ENCOUNTER — Encounter (HOSPITAL_COMMUNITY): Payer: Self-pay | Admitting: Emergency Medicine

## 2017-01-11 ENCOUNTER — Emergency Department (HOSPITAL_COMMUNITY): Payer: BLUE CROSS/BLUE SHIELD

## 2017-01-11 ENCOUNTER — Emergency Department (HOSPITAL_COMMUNITY)
Admission: EM | Admit: 2017-01-11 | Discharge: 2017-01-12 | Disposition: A | Discharge status: Home / Self Care | Payer: BLUE CROSS/BLUE SHIELD | Attending: Emergency Medicine | Admitting: Emergency Medicine

## 2017-01-11 DIAGNOSIS — Z79899 Other long term (current) drug therapy: Secondary | ICD-10-CM | POA: Insufficient documentation

## 2017-01-11 DIAGNOSIS — Y929 Unspecified place or not applicable: Secondary | ICD-10-CM | POA: Diagnosis not present

## 2017-01-11 DIAGNOSIS — W01190A Fall on same level from slipping, tripping and stumbling with subsequent striking against furniture, initial encounter: Secondary | ICD-10-CM | POA: Insufficient documentation

## 2017-01-11 DIAGNOSIS — Y9389 Activity, other specified: Secondary | ICD-10-CM | POA: Insufficient documentation

## 2017-01-11 DIAGNOSIS — Y999 Unspecified external cause status: Secondary | ICD-10-CM | POA: Diagnosis not present

## 2017-01-11 DIAGNOSIS — S92511A Displaced fracture of proximal phalanx of right lesser toe(s), initial encounter for closed fracture: Secondary | ICD-10-CM | POA: Insufficient documentation

## 2017-01-11 DIAGNOSIS — S99921A Unspecified injury of right foot, initial encounter: Secondary | ICD-10-CM | POA: Diagnosis present

## 2017-01-11 IMAGING — DX DG FOOT COMPLETE 3+V*R*
3 series · 3 of 3 positions shown · non-contrast
Comparison: None.

CLINICAL DATA: Right fifth toe pain and erythema after blunt trauma
today.

EXAM:
RIGHT FOOT COMPLETE - 3+ VIEW

[x foot ap right]
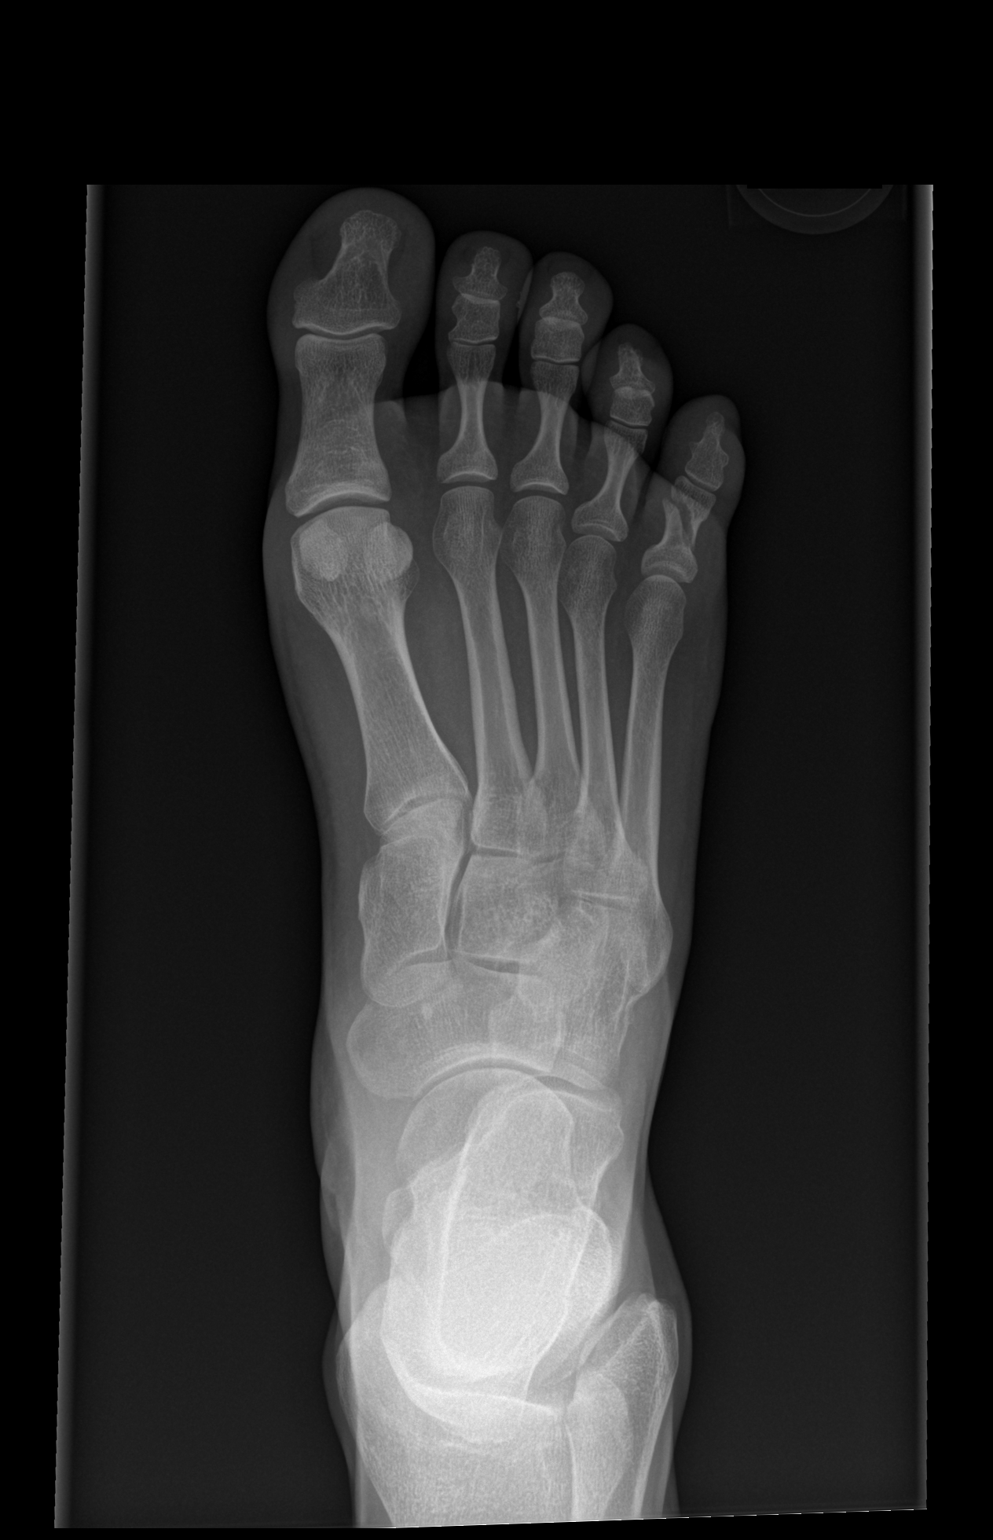

[x foot obl right]
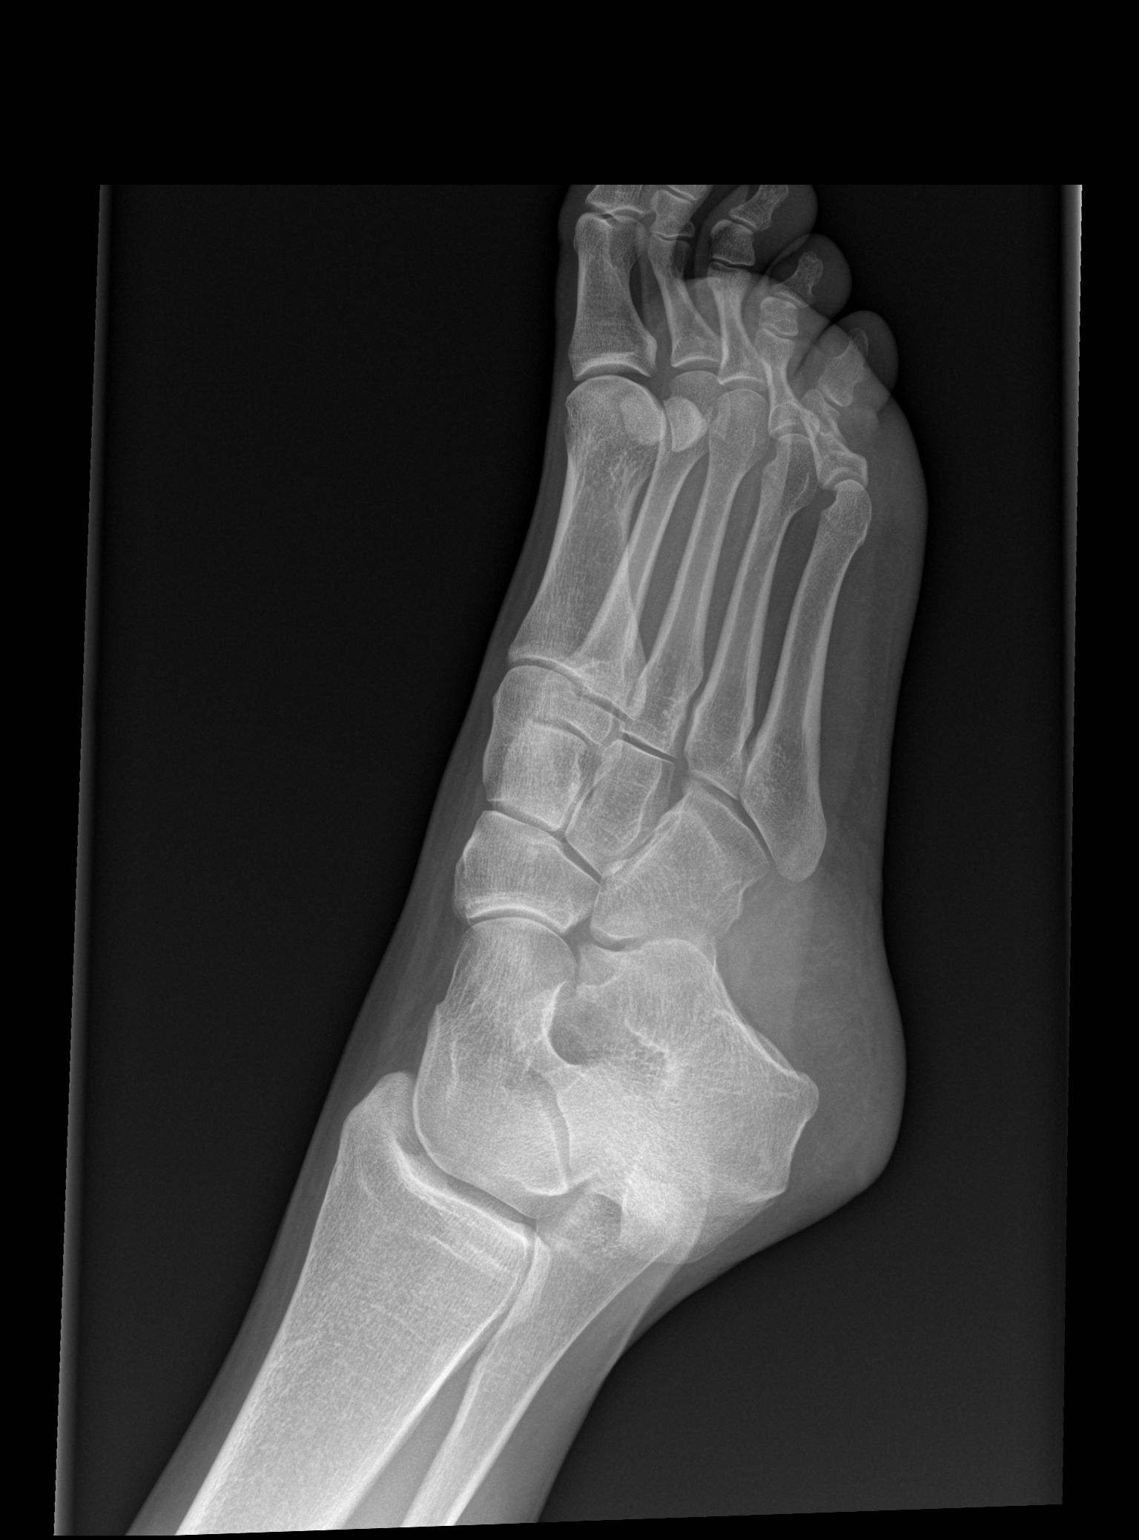

[x foot lat right]
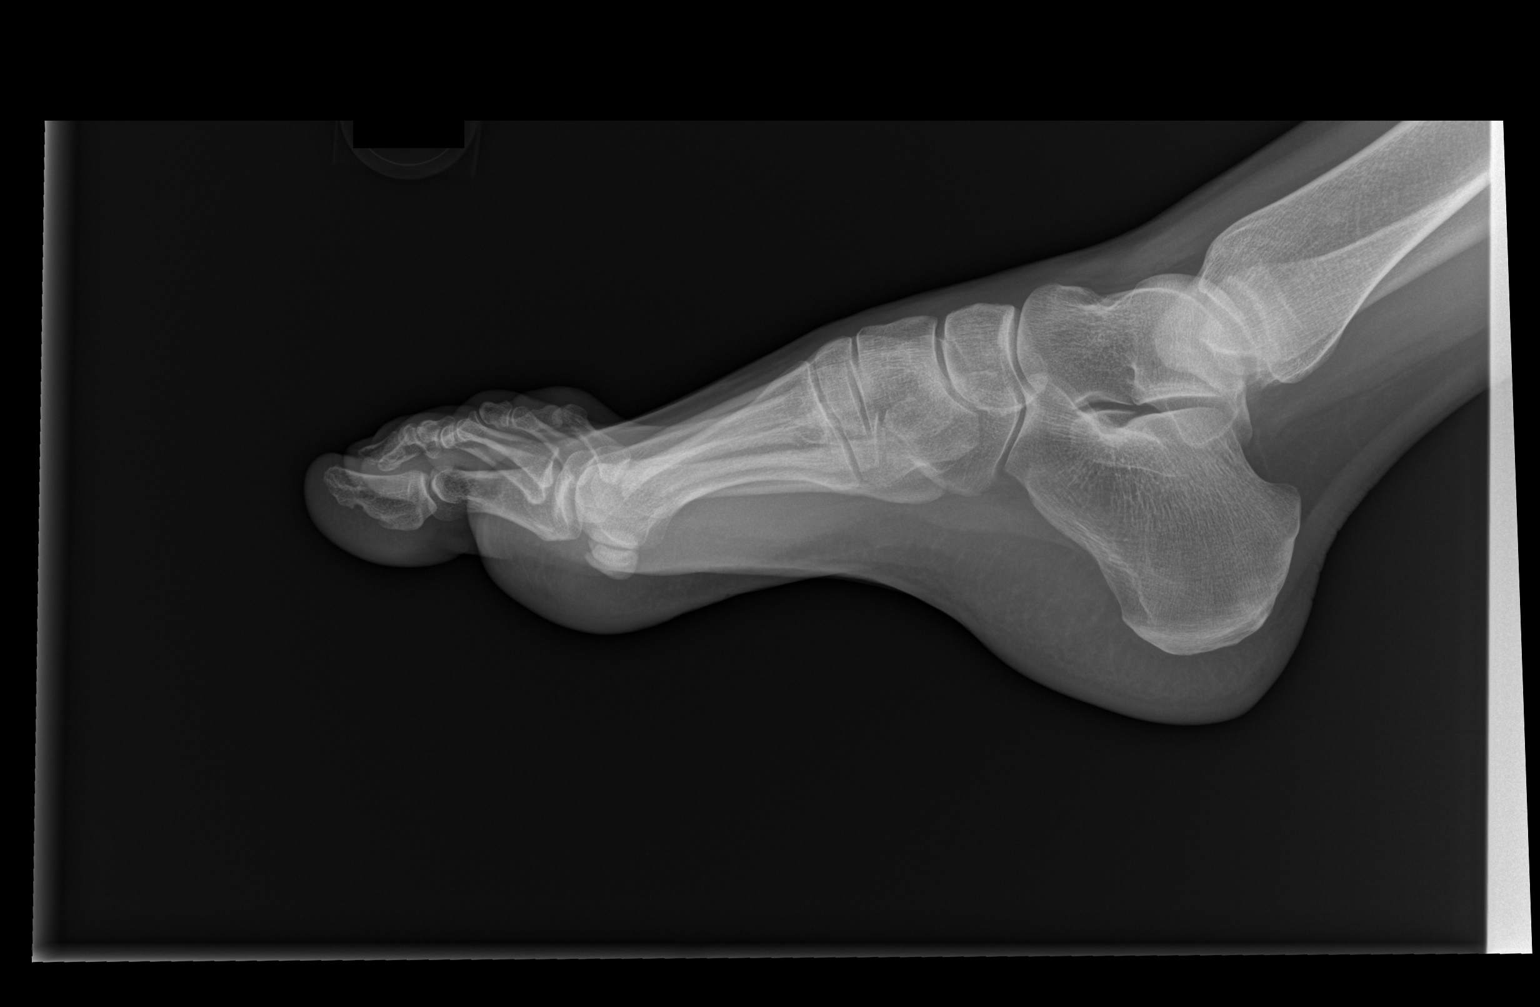

[3 of 3 positions shown; findings below may reference images not displayed]

FINDINGS: There is an oblique fracture of the fifth proximal phalangeal shaft
with 1-2 mm lateral displacement. Articular surfaces are spared. No
radiopaque foreign body.
IMPRESSION: Fifth proximal phalangeal fracture

## 2017-01-11 MED ORDER — IBUPROFEN 400 MG PO TABS
400.0000 mg | ORAL_TABLET | Freq: Once | ORAL | Status: AC
  Administered 2017-01-11: 400 mg via ORAL

## 2017-01-11 MED ORDER — IBUPROFEN 400 MG PO TABS
ORAL_TABLET | ORAL | Status: AC
  Filled 2017-01-11: qty 1

## 2017-01-11 NOTE — ED Triage Notes (Addendum)
Pt presents to the ED with right pinky toe pain after tripping over a chair.

## 2017-01-13 NOTE — ED Provider Notes (Signed)
[facility]-EMERGENCY DEPT Provider Note   CSN: [id number] Arrival date & time: 01/11/17  2132     History   Chief Complaint Chief Complaint  Patient presents with  . Toe Pain    HPI Debbie [name] is a 31 y.o. female.  HPI   Presents with concern for right toe pain.  Stubbed toe tripping over a chair today.  Had immediate pain, swelling.  Pain is moderate.  No other injuries.  Past Medical History:  Diagnosis Date  . History of chicken pox   . POTS (postural orthostatic tachycardia syndrome)     Patient Active Problem List   Diagnosis Date Noted  . Atypical squamous cells of undetermined significance (ASCUS) on Papanicolaou smear of cervix 01/17/2014  . Routine general medical examination at a health care facility 01/28/2013  . POTS (postural orthostatic tachycardia syndrome) 01/28/2013    Past Surgical History:  Procedure Laterality Date  . TONSILLECTOMY AND ADENOIDECTOMY  1997    OB History    No data available       Home Medications    Prior to Admission medications   Medication Sig Start Date End Date Taking? Authorizing Provider  drospirenone-ethinyl estradiol (YAZ,GIANVI,LORYNA) 3-0.02 MG tablet Take 1 tablet by mouth daily. 11/02/15   Sandford Craze, NP  methylphenidate (RITALIN) 10 MG tablet Take 10 mg by mouth 2 (two) times daily.  01/09/14   [provider]    Family History Family History  Problem Relation Age of Onset  . Osteoporosis Mother   . Hyperlipidemia Father   . Heart disease Father        CAD/defibrillator age 33  . Hypertension Father   . Diabetes Father        type 2    Social History Social History  Substance Use Topics  . Smoking status: Never Smoker  . Smokeless tobacco: Never Used  . Alcohol use No     Allergies   Patient has no known allergies.   Review of Systems Review of Systems  Constitutional: Positive for fever.  Gastrointestinal: Negative for nausea and vomiting.  Musculoskeletal:  Positive for arthralgias.  Skin: Negative for rash and wound.  Neurological: Negative for headaches.     Physical Exam Updated Vital Signs BP (!) 150/62   Pulse 70   Temp 98.3 F (36.8 C) (Oral)   Resp 17   Ht  (1.676 m)   Wt 54.4 kg (120 lb)   LMP 12/27/2016   SpO2 99%   BMI 19.37 kg/m   Physical Exam  Constitutional: She is oriented to person, place, and time. She appears well-developed and well-nourished. No distress.  HENT:  Head: Normocephalic and atraumatic.  Eyes: Conjunctivae and EOM are normal.  Neck: Normal range of motion.  Cardiovascular: Normal rate, regular rhythm and intact distal pulses.   Pulmonary/Chest: Effort normal. No respiratory distress.  Musculoskeletal: She exhibits no edema.       Right foot: There is tenderness, bony tenderness and swelling. There is normal capillary refill, no deformity and no laceration.  Neurological: She is alert and oriented to person, place, and time.  Skin: Skin is warm and dry. No rash noted. She is not diaphoretic. No erythema.  Nursing note and vitals reviewed.    ED Treatments / Results  Labs (all labs ordered are listed, but only abnormal results are displayed) Labs Reviewed - No data to display  EKG  EKG Interpretation None       Radiology Dg Foot Complete Right  Result  Date: 01/11/2017 CLINICAL DATA:  Right fifth toe pain and erythema after blunt trauma today. EXAM: RIGHT FOOT COMPLETE - 3+ VIEW COMPARISON:  None. FINDINGS: There is an oblique fracture of the fifth proximal phalangeal shaft with 1-2 mm lateral displacement. Articular surfaces are spared. No radiopaque foreign body. IMPRESSION: Fifth proximal phalangeal fracture Electronically Signed   By: Ellery Plunk M.D.   On: 01/11/2017 22:39    Procedures Procedures (including critical care time)  Medications Ordered in ED Medications  ibuprofen (ADVIL,MOTRIN) tablet 400 mg (400 mg Oral Given 01/11/17 2149)     Initial Impression /  Assessment and Plan / ED Course  I have reviewed the triage vital signs and the nursing notes.  Pertinent labs & imaging results that were available during my care of the patient were reviewed by me and considered in my medical decision making (see chart for details).     31yo female presents with right small toe pain after running into a chair.  XR shows proximal fifth toe phalanx fracture, 1mm displacement. No significant deformity on exam. NV intact. Placed buddy tape, placed in post-toe shoe, WBAT.  Patient discharged in stable condition with understanding of reasons to return.   Final Clinical Impressions(s) / ED Diagnoses   Final diagnoses:  Closed displaced fracture of proximal phalanx of lesser toe of right foot, initial encounter, 1mm    New Prescriptions Discharge Medication List as of 01/12/2017 12:17 AM       [name], MD 01/13/17 1332

## 2018-01-29 ENCOUNTER — Ambulatory Visit: Payer: Self-pay | Admitting: *Deleted

## 2018-01-29 ENCOUNTER — Ambulatory Visit: Payer: PRIVATE HEALTH INSURANCE | Admitting: Medical

## 2018-01-29 ENCOUNTER — Telehealth: Payer: Self-pay | Admitting: Family

## 2018-01-29 ENCOUNTER — Other Ambulatory Visit (HOSPITAL_COMMUNITY)
Admission: RE | Admit: 2018-01-29 | Discharge: 2018-01-29 | Disposition: A | Discharge status: Home / Self Care | Payer: PRIVATE HEALTH INSURANCE | Source: Ambulatory Visit | Attending: Medical | Admitting: Medical

## 2018-01-29 ENCOUNTER — Encounter: Payer: Self-pay | Admitting: Medical

## 2018-01-29 VITALS — BP 123/82 | HR 83 | Temp 98.4°F | Resp 16 | Ht 66.0 in | Wt 120.8 lb

## 2018-01-29 DIAGNOSIS — R109 Unspecified abdominal pain: Secondary | ICD-10-CM | POA: Diagnosis not present

## 2018-01-29 DIAGNOSIS — R102 Pelvic and perineal pain: Secondary | ICD-10-CM | POA: Insufficient documentation

## 2018-01-29 LAB — POC URINALSYSI DIPSTICK (AUTOMATED)
BILIRUBIN UA: NEGATIVE
GLUCOSE UA: NEGATIVE
Ketones, UA: NEGATIVE
Leukocytes, UA: NEGATIVE
NITRITE UA: NEGATIVE
Protein, UA: POSITIVE — AB
RBC UA: NEGATIVE
Spec Grav, UA: 1.02 (ref 1.010–1.025)
Urobilinogen, UA: 1 E.U./dL
pH, UA: 6 (ref 5.0–8.0)

## 2018-01-29 LAB — POCT URINE HCG BY VISUAL COLOR COMPARISON TESTS: Preg Test, Ur: NEGATIVE

## 2018-01-29 MED ORDER — CIPROFLOXACIN HCL 500 MG PO TABS: 500.0000 mg | ORAL_TABLET | Freq: Two times a day (BID) | ORAL | 0 refills | Status: DC

## 2018-01-29 NOTE — Progress Notes (Signed)
Subjective:    Patient ID: Debbie [name], female    DOB: 1985-12-11, 32 y.o.   MRN: [id number]  HPI  Pt has been having some on and off urinary symptoms for 6 weeks. She states had on and off pressure over her bladder with some mild difficulty urinating. Pt tried some otc supplements. Points to suprapubic area and adnexal areas of most recent pain.  Pt states on Friday she just got tightness over suprapubic area(She only took 3.5 days of bactrim before stopping) she explains got bactrim from urgent care recently.  She also reports this past Thursday had some rt ear pain. Pt thought ear may have been infected. Pt states given bactrim to cover potential ear infection and uti.    Pt states years ago had one uti 6-7 years ago. She lived in [place] at that time. Pt not sure if she had culture as part of evaluation back then.   LMP- October 17,2019. Came when expected.  Not married- She is sexually active.   On and off mild abdomen discomfort and bloating for one year. Seems thinks bactrim  helped with bloating.     Review of Systems  Constitutional: Negative for chills and fever.  Respiratory: Negative for chest tightness.   Cardiovascular: Negative for chest pain and palpitations.  Gastrointestinal: Negative for abdominal pain.  Genitourinary: Negative for decreased urine volume, difficulty urinating, flank pain, genital sores, pelvic pain, urgency and vaginal pain.       Pt states urinating less than normal.  Adnexal area pain and mid suprapubic on Friday.  Musculoskeletal: Negative for back pain and myalgias.  Neurological: Negative for dizziness, seizures, speech difficulty, weakness and headaches.  Hematological: Negative for adenopathy.  Psychiatric/Behavioral: Negative for behavioral problems.    Past Medical History:  Diagnosis Date  . History of chicken pox   . POTS (postural orthostatic tachycardia syndrome)      Social History   Socioeconomic History  .  Marital status: Single    Spouse name: Not on file  . Number of children: Not on file  . Years of education: Not on file  . Highest education level: Not on file  Occupational History  . Not on file  Social Needs  . Financial resource strain: Not on file  . Food insecurity:    Worry: Not on file    Inability: Not on file  . Transportation needs:    Medical: Not on file    Non-medical: Not on file  Tobacco Use  . Smoking status: Never Smoker  . Smokeless tobacco: Never Used  Substance and Sexual Activity  . Alcohol use: No  . Drug use: No  . Sexual activity: Yes    Birth control/protection: Pill  Lifestyle  . Physical activity:    Days per week: Not on file    Minutes per session: Not on file  . Stress: Not on file  Relationships  . Social connections:    Talks on phone: Not on file    Gets together: Not on file    Attends religious service: Not on file    Active member of club or organization: Not on file    Attends meetings of clubs or organizations: Not on file    Relationship status: Not on file  . Intimate partner violence:    Fear of current or ex partner: Not on file    Emotionally abused: Not on file    Physically abused: Not on file    Forced sexual activity:  Not on file  Other Topics Concern  . 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 TONSILLECTOMY AND ADENOIDECTOMY  1997    Family History  Problem Relation Age of Onset  . Osteoporosis Mother   . Hyperlipidemia Father   . Heart disease Father        CAD/defibrillator age 58  . Hypertension Father   . Diabetes Father        type 2    No Known Allergies  Current Outpatient Medications on File Prior to Visit  Medication Sig Dispense Refill  . sulfamethoxazole-trimethoprim (BACTRIM DS,SEPTRA DS) 800-160 MG tablet Take 2  daily for 7 days. Started on 01/25/18.  0   No current facility-administered medications on file prior to visit.     BP 123/82 (BP Location: Left Arm, Cuff Size: Normal)   Pulse 83   Temp 98.4 F (36.9 C) (Oral)   Resp 16   Ht 5\' 6"  (1.676 m)   Wt 120 lb 12.8 oz (54.8 kg)   LMP 01/18/2018   SpO2 99%   BMI 19.50 kg/m       Objective:   Physical Exam  General- No acute distress. Pleasant patient. Neck- Full range of motion, no jvd Lungs- Clear, even and unlabored. Heart- regular rate and rhythm. Neurologic- CNII- XII grossly intact. Back-no cva tenderness.  Abdomen- faint suprapubic region tender and faint bilateral  adnexal. But not severe. No masses.  Back- no cva tenderness.      Assessment and Plan. For your intermittent possible UTI symptoms over the past 6 weeks, I do think is a good idea to give you Cipro antibiotic and stop Bactrim.  We will go ahead and get urine culture today and see if bacteria grows out.  In addition since you have symptoms for such a long time, I do think is a good idea to go ahead and rule out other types of bacteria.     If your symptoms persist despite Cipro and your studies come back negative then would refer you to urologist to evaluate for possible interstitial cystitis.  We did get metabolic panel today to assess kidney function.  We will let you know those results when they are in.  Recommended if he wanted use of over-the-counter supplement can use cranberry-based supplement and discontinue other supplements that you are currently taking.  Follow-up in 7 to 10 days or as needed.  Note ear looks normal on exam. Also decided to get h pyori as she also describes some diffuse abdomen bloating that seemed to improve significantly with antibiotic.  Will follow up work and consider pelvic US as well if pain persists.  Esperanza Richters, PA-C   40 minutes spent with pt 50% of time spent counseling on potential differntial diagnosis and  work up that would be needed. Also counseled going forward/referrals depending on results of work up.

## 2018-01-29 NOTE — Patient Instructions (Addendum)
For your intermittent possible UTI symptoms over the past 6 weeks, I do think is a good idea to give you Cipro antibiotic and stop Bactrim.  We will go ahead and get urine culture today and see if bacteria grows out.  In addition since you have symptoms for such a long time, I do think is a good idea to go ahead and rule out other types of bacteria.  If your symptoms persist despite Cipro and your studies come back negative then would refer you to urologist or gyn to evaluate for possible interstitial cystitis.  We did get metabolic panel today to assess kidney function.  We will let you know those results when they are in.  Recommended if he wanted use of over-the-counter supplement can use cranberry-based supplement and discontinue other supplements that you are currently taking.  Follow-up in 7 to 10 days or as needed.

## 2018-01-29 NOTE — Telephone Encounter (Signed)
Received call from [facility] Agent, Bonita Quin, stating that the pt was stomach pain, difficulty urinating; pt disconnected before nurse triage process could be initiated; left message on voicemail.

## 2018-01-29 NOTE — Telephone Encounter (Signed)
Pt called with complaints of abdominal pain and difficulty urinating for 6 weeks; she says that she had been taking vitamins and herbs and that relieved her symptoms; the pt said that she went to urgent care for an ear ache and was given bactrim 01/25/18, and she stopped taking the herbs; on 10/26 she again started having tightness in her abdomen and it "feels like she has a blockage and the urine can not pass"; the pt says that the pain is in her lower abdomen; recommendations made per nurse triage protocol; the pt normally sees Sandford Craze, but she has no availability within the pt's time constraints; she would like to be seen in the office today; pt offered and accepted appointment with Esperanza Richters, 01/29/18 at 1540; the pt verbalized understanding; will route to office for notification of this upcoming appointment.    Reason for Disposition . Urination is difficult to start (i.e., hesitancy) or straining  Answer Assessment - Initial Assessment Questions 1. SYMPTOM: "What's the main symptom you're concerned about?" (e.g., frequency, incontinence)     "feels like I'm not emptying"  2. ONSET: "When did the start?"     6 weeks ago 3. PAIN: "Is there any pain?" If so, ask: "How bad is it?" (Scale: 1-10; mild, moderate, severe)     no 4. CAUSE: "What do you think is causing the symptoms?"     Problem with abdomen 5. OTHER SYMPTOMS: "Do you have any other symptoms?" (e.g., fever, flank pain, blood in urine, pain with urination)     Abdominal pain, pressure, and tightness 6. PREGNANCY: "Is there any chance you are pregnant?" "When was your last menstrual period?"     No 01/18/18  Protocols used: URINARY [facility]

## 2018-01-30 LAB — COMPREHENSIVE METABOLIC PANEL
ALK PHOS: 58 U/L (ref 39–117)
ALT: 11 U/L (ref 0–35)
AST: 14 U/L (ref 0–37)
Albumin: 4.4 g/dL (ref 3.5–5.2)
BILIRUBIN TOTAL: 0.7 mg/dL (ref 0.2–1.2)
BUN: 10 mg/dL (ref 6–23)
CO2: 27 meq/L (ref 19–32)
CREATININE: 0.7 mg/dL (ref 0.40–1.20)
Calcium: 9.3 mg/dL (ref 8.4–10.5)
Chloride: 103 mEq/L (ref 96–112)
GFR: 101.55 mL/min (ref >60.00)
Glucose, Bld: 107 mg/dL — ABNORMAL HIGH (ref 70–99)
Potassium: 4.1 mEq/L (ref 3.5–5.1)
SODIUM: 138 meq/L (ref 135–145)
TOTAL PROTEIN: 6.5 g/dL (ref 6.0–8.3)

## 2018-01-30 LAB — URINE CULTURE
MICRO NUMBER: 91293626
Result:: NO GROWTH
SPECIMEN QUALITY:: ADEQUATE

## 2018-01-30 LAB — H. PYLORI BREATH TEST: H. pylori Breath Test: NOT DETECTED

## 2018-01-31 ENCOUNTER — Encounter: Payer: Self-pay | Admitting: Medical

## 2018-01-31 LAB — URINE CYTOLOGY ANCILLARY ONLY
CHLAMYDIA, DNA PROBE: NEGATIVE
NEISSERIA GONORRHEA: NEGATIVE
TRICH (WINDOWPATH): NEGATIVE

## 2018-02-01 ENCOUNTER — Telehealth: Payer: Self-pay | Admitting: Medical

## 2018-02-01 DIAGNOSIS — R39198 Other difficulties with micturition: Secondary | ICD-10-CM

## 2018-02-01 DIAGNOSIS — R102 Pelvic and perineal pain: Secondary | ICD-10-CM

## 2018-02-01 LAB — URINE CYTOLOGY ANCILLARY ONLY: BACTERIAL VAGINITIS: NEGATIVE

## 2018-02-01 NOTE — Telephone Encounter (Signed)
Referral to urologist placed. 

## 2018-02-08 ENCOUNTER — Ambulatory Visit (INDEPENDENT_AMBULATORY_CARE_PROVIDER_SITE_OTHER): Payer: PRIVATE HEALTH INSURANCE | Admitting: Medical

## 2018-02-08 ENCOUNTER — Encounter: Payer: Self-pay | Admitting: Medical

## 2018-02-08 VITALS — BP 133/88 | HR 85 | Temp 97.9°F | Resp 16 | Ht 66.0 in | Wt 122.5 lb

## 2018-02-08 DIAGNOSIS — R109 Unspecified abdominal pain: Secondary | ICD-10-CM | POA: Diagnosis not present

## 2018-02-08 MED ORDER — AMOXICILLIN-POT CLAVULANATE 875-125 MG PO TABS: 1.0000 | ORAL_TABLET | Freq: Two times a day (BID) | ORAL | 0 refills | Status: DC

## 2018-02-08 MED ORDER — METRONIDAZOLE 500 MG PO TABS: 500.0000 mg | ORAL_TABLET | Freq: Three times a day (TID) | ORAL | 0 refills | Status: DC

## 2018-02-08 NOTE — Patient Instructions (Addendum)
You have had history of recurrent intermittent abdomen pain and bloating.  Recently when you were treated for possible UTI an you noted improvement of lower abdomen complaints.  Your abdomen complaints are little on the atypical side.  The fact that you did respond to antibiotics makes me consider diverticulitis or colitis as a possibility.  Will prescribe Flagyl antibiotic and after discussion decided on prescribing Augmentin.  Most of the time  we prescribe Cipro in combination with Flagyl as standard for potential diverticulitis.  However you request not to be on Cipro again.  I do recommend you continue probiotics while on the antibiotic.  Will go ahead and make referral to gastroenterologist.  I am going to send this note to your PCP so she can review and she may adjust treatment regimen or possibly extend antibiotic further.  Or she may elect to just wait until you see gastroenterologist.  Follow-up next week with PCP or as needed.

## 2018-02-08 NOTE — Progress Notes (Signed)
Subjective:    Patient ID: Debbie [name], female    DOB: [birth year], 32 y.o.   MRN: [id number]  HPI  Pt in states her urinating issues has resolved. No recurrent urinary type symptoms. Pt no longer on cipro. Pt urologist appointment still pending for December if she needs will attend. She state might cancel.    Pt does report hx of bloating/abdomen discomfort on and off for years. She states worse over past 3 weeks In the past she mentioned chicken would upset her stomach. Pt ate some salmon the other day and felt her stomach get upset. Pt has used probiotics in the pas but did not helpt. It did not help. She does eat healthy. When she experiences a lot of pain will pass more gas. Pt had negative h pylori breath. No fever, no chills or sweats. No family inflammatory of chrons or ulcerative colitis. Sometimes will get mild loose stools but no diarrhea. No constipation.    Pt has seen herbalist in past to help her symptoms.  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 Given herbalist type meds with theory to get proper balance of gut bacteria and get rid of overgrowth per pt.  Most recent more severe event was Tuesday night.  lmp- 01-18-2018.  Pt is getting papsmear upcoming week.  Pt state wants trial of antibiotics. She thinks this will help as herbalist was giving remedies that would get rid of bacteria. She would feel better but not completely. Also tried gluten free diet and probiotics but did not help.  Pt did feel better with use of bactrim when ED gave  And some better when given  Cipro. She does expresses some desperation/frustration and wants to be on antibitoics further.   Review of Systems  Constitutional: Negative for chills, fatigue and fever.  Respiratory: Negative for cough, choking, chest tightness and wheezing.   Cardiovascular: Negative for chest pain and palpitations.  Gastrointestinal: Positive for abdominal distention and abdominal pain.       Not presently but  waxing and waning symtoms.  Musculoskeletal: Negative for back pain.  Neurological: Negative for dizziness, speech difficulty, weakness and light-headedness.  Hematological: Negative for adenopathy. Does not bruise/bleed easily.  Psychiatric/Behavioral: Negative for behavioral problems and confusion.    Past Medical History:  Diagnosis Date  . History of chicken pox   . POTS (postural orthostatic tachycardia syndrome)      Social History   Socioeconomic History  . Marital status: Single    Spouse name: Not on file  . Number of children: Not on file  . Years of education: Not on file  . Highest education level: Not on file  Occupational History  . Not on file  Social Needs  . Financial resource strain: Not on file  . Food insecurity:    Worry: Not on file    Inability: Not on file  . Transportation needs:    Medical: Not on file    Non-medical: Not on file  Tobacco Use  . Smoking status: Never Smoker  . Smokeless tobacco: Never Used  Substance and Sexual Activity  . Alcohol use: No  . Drug use: No  . Sexual activity: Yes    Birth control/protection: Pill  Lifestyle  . Physical activity:    Days per week: Not on file    Minutes per session: Not on file  . Stress: Not on file  Relationships  . Social connections:    Talks on phone: Not on file    Gets  together: Not on file    Attends religious service: Not on file    Active member of club or organization: Not on file    Attends meetings of clubs or organizations: Not on file    Relationship status: Not on file  . Intimate partner violence:    Fear of current or ex partner: Not on file    Emotionally abused: Not on file    Physically abused: Not on file    Forced sexual activity: Not on file  Other Topics Concern  . 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 TONSILLECTOMY AND ADENOIDECTOMY  1997    Family History  Problem Relation Age of Onset  . Osteoporosis Mother   . Hyperlipidemia Father   . Heart disease Father        CAD/defibrillator age 31  . Hypertension Father   . Diabetes Father        type 2    No Known Allergies  Current Outpatient Medications on File Prior to Visit  Medication Sig Dispense Refill  . ciprofloxacin (CIPRO) 500 MG tablet Take 1 tablet (500 mg total) by mouth 2 (two) times daily. (Patient not taking: Reported on 02/08/2018) 14 tablet 0   No current facility-administered medications on file prior to visit.     BP 133/88 (BP Location: Left Arm, Patient Position: Sitting, Cuff Size: Small)   Pulse 85   Temp 97.9 F (36.6 C) (Oral)   Resp 16   Ht 5\' 6"  (1.676 m)   Wt 122 lb 8 oz (55.6 kg)   LMP 01/18/2018   SpO2 100%   BMI 19.77 kg/m       Objective:   Physical Exam  General Appearance- Not in acute distress.  HEENT Eyes- Scleraeral/Conjuntiva-bilat- Not Yellow. Mouth & Throat- Normal.  Chest and Lung Exam Auscultation: Breath sounds:-Normal. Adventitious sounds:- No Adventitious sounds.  Cardiovascular Auscultation:Rythm - Regular. Heart Sounds -Normal heart sounds.  Abdomen Inspection:-Inspection Normal.  Palpation/Perucssion: Palpation and Percussion of the abdomen reveal- Non Tender, No Rebound tenderness, No rigidity(Guarding) and No Palpable abdominal masses.  Liver:-Normal.  Spleen:- Normal.   Back- no cva tenderness.      Assessment & Plan:  You have had history of recurrent intermittent abdomen pain and bloating.  Recently when you were treated for possible UTI an you noted improvement of lower abdomen complaints.  Your abdomen complaints are little on the atypical side.  The fact that you did respond to antibiotics makes me consider diverticulitis or colitis as a possibility.  Will prescribe Flagyl antibiotic and after discussion decided on  prescribing Augmentin.  Most of the time  we prescribe Cipro in combination with Flagyl as standard for potential diverticulitis.  However you request not to be on Cipro again.  I do recommend you continue probiotics while on the antibiotic.  Will go ahead and make referral to gastroenterologist.  I am going to send this note to your PCP so she can review and she may adjust treatment regimen or possibly extend antibiotic further.  Or she may elect to just wait until you see gastroenterologist.  Follow-up next week with PCP or as needed.  Esperanza Richters, PA-C

## 2018-02-14 ENCOUNTER — Ambulatory Visit (INDEPENDENT_AMBULATORY_CARE_PROVIDER_SITE_OTHER): Payer: PRIVATE HEALTH INSURANCE | Admitting: Family

## 2018-02-14 ENCOUNTER — Encounter: Payer: Self-pay | Admitting: Family

## 2018-02-14 VITALS — BP 137/71 | HR 48 | Temp 98.5°F | Resp 16 | Ht 66.0 in | Wt 122.8 lb

## 2018-02-14 DIAGNOSIS — R3989 Other symptoms and signs involving the genitourinary system: Secondary | ICD-10-CM

## 2018-02-14 DIAGNOSIS — R109 Unspecified abdominal pain: Secondary | ICD-10-CM | POA: Diagnosis not present

## 2018-02-14 DIAGNOSIS — L209 Atopic dermatitis, unspecified: Secondary | ICD-10-CM | POA: Diagnosis not present

## 2018-02-14 DIAGNOSIS — Z91018 Allergy to other foods: Secondary | ICD-10-CM | POA: Diagnosis not present

## 2018-02-14 DIAGNOSIS — Z23 Encounter for immunization: Secondary | ICD-10-CM

## 2018-02-14 MED ORDER — TACROLIMUS 0.1 % EX OINT: TOPICAL_OINTMENT | Freq: Two times a day (BID) | CUTANEOUS | 0 refills | Status: DC

## 2018-02-14 NOTE — Patient Instructions (Signed)
Please complete lab work prior to leaving. You should be contacted about your referral to the allergist.  If you do not hear about your referral to GI in the next few days please contact their office [phone]. Complete stool sample and return at your earliest convenience to the Lab at [facility].  You may apply protopic twice daily to eyelids as needed. Call if new/worsening symptoms.

## 2018-02-14 NOTE — Progress Notes (Signed)
Subjective:    Patient ID: Debbie [name], female    DOB: 17-Nov-1985, 31 y.o.   MRN: [id number]  HPI   Patient is a 32 yr old female who presents today for follow up.   Had "POTS" diagnosis but reports that she stopped OCP and that within a few months her vision was improved, brain fog was better.  Prior to that she had been on ritalin. Has successfully weaned off of ritalin.  Attributes symptoms to reaction to OCP rather than POTs at this point. Was working with [facility] for some time.   Reports that last year she began having stomach issues where she would get bloating, itching skin/redness/swelling above her eyes, stomach pain. August those issues became more severe. Stopped gluten in May, this helped. Stopped chicken/eggs/pork. Reports that in August her stomach issues worsened and she developed difficulty urinating. Was having bladder pressure.  This would be intermittent. She saw Rossie Muskrat that symptoms resolved with cipro. Has a consult pending with urology. Denies urinary symptoms today.  Last Thursday had salmon and rice/zucchini/carrots.  Had recurrent stomach pain.  This is in the lower abdomen. Had difficulty sleeping.  Was given augmentin.  Felt OK on Friday. Saturday AM had nausea from the pills. Stopped augmentin.  Last day that she had pain was on Thursday. Last bm this AM.  Reports normal bowel habits.   Wt Readings from Last 3 Encounters:  02/14/18 122 lb 12.8 oz (55.7 kg)  02/08/18 122 lb 8 oz (55.6 kg)  01/29/18 120 lb 12.8 oz (54.8 kg)       Review of Systems See HPI  Past Medical History:  Diagnosis Date  . History of chicken pox   . POTS (postural orthostatic tachycardia syndrome)      Social History   Socioeconomic History  . Marital status: Single    Spouse name: Not on file  . Number of children: Not on file  . Years of education: Not on file  . Highest education level: Not on file  Occupational History  . Not on  file  Social Needs  . Financial resource strain: Not on file  . Food insecurity:    Worry: Not on file    Inability: Not on file  . Transportation needs:    Medical: Not on file    Non-medical: Not on file  Tobacco Use  . Smoking status: Never Smoker  . Smokeless tobacco: Never Used  Substance and Sexual Activity  . Alcohol use: No  . Drug use: No  . Sexual activity: Yes    Birth control/protection: Pill  Lifestyle  . Physical activity:    Days per week: Not on file    Minutes per session: Not on file  . Stress: Not on file  Relationships  . Social connections:    Talks on phone: Not on file    Gets together: Not on file    Attends religious service: Not on file    Active member of club or organization: Not on file    Attends meetings of clubs or organizations: Not on file    Relationship status: Not on file  . Intimate partner violence:    Fear of current or ex partner: Not on file    Emotionally abused: Not on file    Physically abused: Not on file    Forced sexual activity: Not on file  Other Topics Concern  . 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 TONSILLECTOMY AND ADENOIDECTOMY  1997    Family History  Problem Relation Age of Onset  . Osteoporosis Mother   . Hyperlipidemia Father   . Heart disease Father        CAD/defibrillator age 78  . Hypertension Father   . Diabetes Father        type 2    No Known Allergies  No current outpatient medications on file prior to visit.   No current facility-administered medications on file prior to visit.     BP 137/71 (BP Location: Right Arm, Cuff Size: Normal)   Pulse (!) 48   Temp 98.5 F (36.9 C) (Oral)   Resp 16   Ht '5\' 6"'  (1.676 m)   Wt 122 lb 12.8 oz (55.7 kg)   LMP 02/13/2018   SpO2 100%   BMI 19.82 kg/m         Objective:   Physical Exam  Constitutional: She appears well-developed and well-nourished.  Cardiovascular: Normal rate, regular rhythm and normal heart sounds.  No murmur heard. Pulmonary/Chest: Effort normal and breath sounds normal. No respiratory distress. She has no wheezes.  Abdominal: Soft. She exhibits no mass. There is no tenderness. There is no guarding.  Skin:  Mild erythema bilateral upper eyelids   Psychiatric: She has a normal mood and affect. Her behavior is normal. Judgment and thought content normal.          Assessment & Plan:  Atopic dermatitis- eyelids. Rx with protopic.  Abdominal pain- I suspect that her symptoms are related to IBS.  Will order stool profile and refer to GI for further evaluation. No pain today. Will also refer to allergist for formal allergy testing to foods.  No indication for abx at this time.   Urinary problems- check follow Urine culture today. She has upcoming appointment with urology.   Works at [employer] where there is a mumps outbreak. Will give MMR booster today. Flu shot today as well.

## 2018-02-15 LAB — URINE CULTURE
MICRO NUMBER: 91367369
RESULT: NO GROWTH
SPECIMEN QUALITY:: ADEQUATE

## 2018-02-19 ENCOUNTER — Other Ambulatory Visit: Payer: PRIVATE HEALTH INSURANCE

## 2018-02-19 DIAGNOSIS — R109 Unspecified abdominal pain: Secondary | ICD-10-CM

## 2018-02-21 ENCOUNTER — Telehealth: Payer: Self-pay | Admitting: Family

## 2018-02-21 LAB — GI PROFILE, STOOL, PCR
ADENOVIRUS F 40/41: NOT DETECTED
Astrovirus: NOT DETECTED
C difficile toxin A/B: DETECTED — AB
CAMPYLOBACTER: NOT DETECTED
Cryptosporidium: NOT DETECTED
Cyclospora cayetanensis: NOT DETECTED
ENTAMOEBA HISTOLYTICA: NOT DETECTED
ENTEROAGGREGATIVE E COLI: NOT DETECTED
ENTEROTOXIGENIC E COLI: NOT DETECTED
Enteropathogenic E coli: NOT DETECTED
GIARDIA LAMBLIA: NOT DETECTED
NOROVIRUS GI/GII: NOT DETECTED
PLESIOMONAS SHIGELLOIDES: NOT DETECTED
Rotavirus A: NOT DETECTED
Salmonella: NOT DETECTED
Sapovirus: NOT DETECTED
Shiga-toxin-producing E coli: NOT DETECTED
Shigella/Enteroinvasive E coli: NOT DETECTED
VIBRIO CHOLERAE: NOT DETECTED
Vibrio: NOT DETECTED
YERSINIA ENTEROCOLITICA: NOT DETECTED

## 2018-02-21 MED ORDER — VANCOMYCIN HCL 125 MG PO CAPS: 125.0000 mg | ORAL_CAPSULE | Freq: Four times a day (QID) | ORAL | 0 refills | Status: AC

## 2018-02-21 NOTE — Telephone Encounter (Signed)
See mychart.  

## 2018-02-28 ENCOUNTER — Encounter: Payer: Self-pay | Admitting: Gastroenterology

## 2018-03-07 ENCOUNTER — Ambulatory Visit: Payer: PRIVATE HEALTH INSURANCE | Admitting: Gastroenterology

## 2018-03-07 ENCOUNTER — Encounter (HOSPITAL_COMMUNITY): Payer: Self-pay | Admitting: Emergency Medicine

## 2018-03-07 ENCOUNTER — Emergency Department (HOSPITAL_COMMUNITY)
Admission: EM | Admit: 2018-03-07 | Discharge: 2018-03-07 | Disposition: A | Discharge status: Home / Self Care | Payer: PRIVATE HEALTH INSURANCE | Attending: Emergency Medicine | Admitting: Emergency Medicine

## 2018-03-07 ENCOUNTER — Other Ambulatory Visit: Payer: Self-pay

## 2018-03-07 DIAGNOSIS — R0981 Nasal congestion: Secondary | ICD-10-CM | POA: Insufficient documentation

## 2018-03-07 DIAGNOSIS — H9202 Otalgia, left ear: Secondary | ICD-10-CM | POA: Insufficient documentation

## 2018-03-07 DIAGNOSIS — J3489 Other specified disorders of nose and nasal sinuses: Secondary | ICD-10-CM | POA: Diagnosis not present

## 2018-03-07 DIAGNOSIS — J029 Acute pharyngitis, unspecified: Secondary | ICD-10-CM | POA: Insufficient documentation

## 2018-03-07 DIAGNOSIS — Z209 Contact with and (suspected) exposure to unspecified communicable disease: Secondary | ICD-10-CM | POA: Diagnosis not present

## 2018-03-07 DIAGNOSIS — R05 Cough: Secondary | ICD-10-CM | POA: Insufficient documentation

## 2018-03-07 DIAGNOSIS — R059 Cough, unspecified: Secondary | ICD-10-CM

## 2018-03-07 LAB — GROUP A STREP BY PCR: GROUP A STREP BY PCR: NOT DETECTED

## 2018-03-07 MED ORDER — DOXYCYCLINE HYCLATE 100 MG PO CAPS: 100.0000 mg | ORAL_CAPSULE | Freq: Two times a day (BID) | ORAL | 0 refills | Status: DC

## 2018-03-07 MED ORDER — BENZONATATE 100 MG PO CAPS: 100.0000 mg | ORAL_CAPSULE | Freq: Three times a day (TID) | ORAL | 0 refills | Status: DC

## 2018-03-07 MED ORDER — ACETAMINOPHEN 325 MG PO TABS: 650.0000 mg | ORAL_TABLET | Freq: Once | ORAL | Status: DC | PRN

## 2018-03-07 NOTE — Discharge Instructions (Addendum)
I suspect that you have a viral upper respiratory infection that has led to a bacterial infection.  We have also discussed the possibility that this is causing barotrauma from flying.  I am prescribing you a antibiotic. Please take all of your antibiotics until finished!   You may develop abdominal discomfort or diarrhea from the antibiotic.  You may help offset this with probiotics which you can buy or get in yogurt. Do not eat or take the probiotics until 2 hours after your antibiotic. Do not take your medicine if develop an itchy rash, swelling in your mouth or lips, or difficulty breathing. Please see attached handouts. I would like you to keep your prescribed appointment with your pcp on Friday for follow up. You can take Tylenol as needed for body aches. Mucinex is helpful for sinus pressure and congestion. Please do not take if you think there is a chance you can be pregnant. Take tessalon as needed for cough. If you develop worsening or new concerning symptoms you can return to the emergency department for re-evaluation.

## 2018-03-07 NOTE — ED Notes (Signed)
Bed: [facility]3 Expected date:  Expected time:  Means of arrival:  Comments: 

## 2018-03-07 NOTE — ED Provider Notes (Signed)
[facility]-EMERGENCY DEPT Provider Note   CSN: [id number] Arrival date & time: 03/07/18  0248     History   Chief Complaint Chief Complaint  Patient presents with  . Sore Throat    HPI Debbie [name] is a 32 y.o. female with medical history as below who presents emergency department today for nasal congestion, rhinorrhea, left ear pain, sore throat with associated dysphasia, body aches, low-grade fevers at home and cough.  Patient reports that she recently flew for [place].  She notes that she did have several sick contacts over the holiday and thinks she may have caught something from them. She reports that on Thursday into Friday she began having nasal congestion, rhinorrhea and a sore throat.  She also notes some myalgias and a low-grade fever of 99-100.  She notes she had taken some NyQuil which relieved her fever and body aches.  She reports for the last several days she has not been having pain in her left ear, as well as a cough with clear/yellow sputum.  Patient denies any headaches, neck stiffness, changes in voice, inability to tolerate secretions, chest pain, shortness of breath, abdominal pain, nausea/vomiting/diarrhea or urinary symptoms.  No antipyretics prior to arrival. 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   HPI  Past Medical History:  Diagnosis Date  . History of chicken pox   . POTS (postural orthostatic tachycardia syndrome)     Patient Active Problem List   Diagnosis Date Noted  . Atypical squamous cells of undetermined significance (ASCUS) on Papanicolaou smear of cervix 01/17/2014  . Routine general medical examination at a health care facility 01/28/2013  . POTS (postural orthostatic tachycardia syndrome) 01/28/2013    Past Surgical History:  Procedure Laterality Date  . TONSILLECTOMY AND ADENOIDECTOMY  1997     OB History   None      Home Medications     Prior to Admission medications   Medication Sig Start Date End Date Taking? Authorizing Provider  tacrolimus (PROTOPIC) 0.1 % ointment Apply topically 2 (two) times daily. 02/14/18   Sandford Craze, NP    Family History Family History  Problem Relation Age of Onset  . Osteoporosis Mother   . Hyperlipidemia Father   . Heart disease Father        CAD/defibrillator age 65  . Hypertension Father   . Diabetes Father        type 2    Social History Social History   Tobacco Use  . Smoking status: Never Smoker  . Smokeless tobacco: Never Used  Substance Use Topics  . Alcohol use: No  . Drug use: No     Allergies   Gluten meal   Review of Systems Review of Systems  All other systems reviewed and are negative.    Physical Exam Updated Vital Signs BP 137/86   Pulse 84   Temp 98 F (36.7 C) (Oral)   Resp 18   Ht 5\' 6"  (1.676 m)   Wt 54.4 kg   LMP 02/13/2018   SpO2 100%   BMI 19.37 kg/m   Physical Exam  Constitutional: She appears well-developed and well-nourished.  HENT:  Head: Normocephalic and atraumatic.  Right Ear: Tympanic membrane, external ear and ear canal normal. No mastoid tenderness.  Left Ear: External ear and ear canal normal. No mastoid tenderness. Tympanic membrane is erythematous and bulging. Tympanic membrane is not perforated.  Nose: Mucosal edema and rhinorrhea  present. Right sinus exhibits maxillary sinus tenderness. Right sinus exhibits no frontal sinus tenderness. Left sinus exhibits maxillary sinus tenderness. Left sinus exhibits no frontal sinus tenderness.  Mouth/Throat: Uvula is midline, oropharynx is clear and moist and mucous membranes are normal. No tonsillar exudate.  No mastoid erythema, edema or obliteration of postauricular crease. The patient has noted laryngitis.  No hot potato voice.  She is in control of secretions. No stridor.  Midline uvula without edema. Soft palate rises symmetrically.  Tonsils absent.  Mild erythema  of posterior pharynx. No edema. No PTA. Tongue protrusion is normal. No trismus. No creptius on neck palpation and patient has good dentition. No gingival erythema or fluctuance noted. Mucus membranes moist. No facial swelling.   Eyes: Pupils are equal, round, and reactive to light. Right eye exhibits no discharge. Left eye exhibits no discharge. No scleral icterus.  Neck: Trachea normal. Neck supple. No spinous process tenderness present. No neck rigidity. Normal range of motion present.  No nuchal rigidity or meningismus  Cardiovascular: Normal rate, regular rhythm and intact distal pulses.  No murmur heard. Pulses:      Radial pulses are 2+ on the right side, and 2+ on the left side.       Dorsalis pedis pulses are 2+ on the right side, and 2+ on the left side.       Posterior tibial pulses are 2+ on the right side, and 2+ on the left side.  Pulmonary/Chest: Effort normal and breath sounds normal. She exhibits no tenderness.  Abdominal: Soft. Bowel sounds are normal. There is no tenderness. There is no rebound and no guarding.  Musculoskeletal: She exhibits no edema.  Lymphadenopathy:    She has no cervical adenopathy.  Neurological: She is alert.  Skin: Skin is warm and dry. No rash noted. She is not diaphoretic.  Psychiatric: She has a normal mood and affect.  Nursing note and vitals reviewed.    ED Treatments / Results  Labs (all labs ordered are listed, but only abnormal results are displayed) Labs Reviewed  GROUP A STREP BY PCR    EKG None  Radiology No results found.  Procedures Procedures (including critical care time)  Medications Ordered in ED Medications  acetaminophen (TYLENOL) tablet 650 mg (has no administration in time range)     Initial Impression / Assessment and Plan / ED Course  I have reviewed the triage vital signs and the nursing notes.  Pertinent labs & imaging results that were available during my care of the patient were reviewed by me and  considered in my medical decision making (see chart for details).     32 y.o. female who presents with flu like symptoms.  Patient is noted to have pain and pressure over her maxillary sinuses.  She is also noted to have a bulging and erythematous TM on the left.  There is no perforation.  Patient is with evidence of mastoiditis.  She is without meningeal signs.  Vital signs are reassuring on presentation.  Oropharyngeal exam is without concern for PTA or RPA.  Strep test is negative.  Lungs are CTA b/l. Question developing sinusitis with AOM vs viral uri with barotrauma from recent plane travel. Discussed this with patient and options for tx. Will tx with Doxycycline. Patient reports intolerance to Augmentin recently.  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  I recommended Tylenol, ibuprofen and Mucinex for other symptoms.  Will give Tessalon for cough.  Patient has follow-up appointment with  her PCP on Friday.  Recommended keeping this appointment.  Return precautions were discussed.  Patient appears safe for discharge.  Final Clinical Impressions(s) / ED Diagnoses   Final diagnoses:  Sore throat  Left ear pain  Cough    ED Discharge Orders         Ordered    benzonatate (TESSALON) 100 MG capsule  Every 8 hours     03/07/18 0659    doxycycline (VIBRAMYCIN) 100 MG capsule  2 times daily     03/07/18 0659           Jacinto HalimMaczis, Blaike Vickers M, PA-C 03/07/18 0700    Dione BoozeGlick, David, MD 03/07/18 [id number]

## 2018-03-07 NOTE — ED Notes (Signed)
Bed: WA11 Expected date:  Expected time:  Means of arrival:  Comments: 

## 2018-03-07 NOTE — ED Triage Notes (Signed)
Pt c/o sore throat and ear pain onset x 6 days admits ti intermittant 100' fever  Tylenol  And ibuprophen but pain returns

## 2018-03-08 ENCOUNTER — Ambulatory Visit: Payer: Self-pay

## 2018-03-08 NOTE — Telephone Encounter (Signed)
Patient called and says "I was seen in the UC yesterday and was put on an antibiotic for an ear infection. I went to sleep yesterday morning and when I woke up, there was a red-brown discharge on the pillow. I wiped it with a tissue from my left ear. I have an appointment tomorrow, but I was just wanting to know if this will be alright until tomorrow." I asked about pain, fever, she denies. I asked how much drainage, she says "it's not draining when I'm up and walking, only when I lay down it drains on the pillow." I advised that as long as she's on antibiotics, the infection is clearing up and some drainage is not uncommon with ear infections. I advised if the drainage becomes more severe, bright red, pain in the ear, then go to the UC or ED. Otherwise come in for appointment already scheduled for tomorrow at 1020 with Sandford CrazeMelissa O'Sullivan, NP, patient verbalized understanding.  Reason for Disposition . Yellow or cloudy discharge from ear canal  Answer Assessment - Initial Assessment Questions 1. LOCATION: "Which ear is involved?"      Left ear 2. COLOR: "What is the color of the discharge?"      Red-brown 3. CONSISTENCY: "How runny is the discharge? Could it be water?"      Pretty liquid, notice it when lying down, drainage on pillow 4. ONSET: "When did you first notice the discharge?"     Yesterday morning 5. PAIN: "Is there any earache?" "How bad is it?"  (Scale 1-10; or mild, moderate, severe)     No pain 6. OBJECTS: "Any use of q-tips or have you inserted anything else in your ear?"     No 7. OTHER SYMPTOMS: "Do you have any other symptoms?" (e.g., headache, fever, dizziness, vomiting, runny nose)     Runny nose from cold 8. PREGNANCY: "Is there any chance you are pregnant?" "When was your last menstrual period?"     No  Answer Assessment - Initial Assessment Questions 1. ANTIBIOTIC: "What antibiotic are you receiving?" "How many times per day?"     Doxycycline twice a day 2. ONSET:  "When was the antibiotic started?"     Yesterday 3. PAIN: "How bad is the pain?"   (Scale 1-10; mild, moderate or severe)   - MILD (1-3): doesn't interfere with normal activities    - MODERATE (4-7): interferes with normal activities or awakens from sleep    - SEVERE (8-10): excruciating pain, unable to do any normal activities      No 4. FEVER: "Do you have a fever?" If so, ask: "What is your temperature, how was it measured, and when did it start?"     No 5. DISCHARGE: "Is there any discharge from the ear?"     Yes, reddish-brown from left ear 6. OTHER SYMPTOMS: "Do you have any other symptoms?" (e.g., headache, stiff neck, dizziness, vomiting, runny nose)     Runny nose from cold  Protocols used: EAR - OTITIS MEDIA FOLLOW-UP CALL-A-AH, EAR - DISCHARGE-A-AH

## 2018-03-09 ENCOUNTER — Encounter: Payer: Self-pay | Admitting: Family

## 2018-03-09 ENCOUNTER — Ambulatory Visit (INDEPENDENT_AMBULATORY_CARE_PROVIDER_SITE_OTHER): Payer: PRIVATE HEALTH INSURANCE | Admitting: Family

## 2018-03-09 VITALS — BP 142/70 | HR 75 | Temp 97.9°F | Resp 16 | Ht 66.0 in | Wt 121.6 lb

## 2018-03-09 DIAGNOSIS — H6692 Otitis media, unspecified, left ear: Secondary | ICD-10-CM

## 2018-03-09 DIAGNOSIS — A0472 Enterocolitis due to Clostridium difficile, not specified as recurrent: Secondary | ICD-10-CM | POA: Diagnosis not present

## 2018-03-09 NOTE — Progress Notes (Signed)
Subjective:    Patient ID: Debbie [name], female    DOB: [birth year], 32 y.o.   MRN: [id number]  HPI   Patient is a 32 yr old female who presents today for follow up.  C dif- notes that symptoms improved with vancomycin.  Feels like bowel does not completely empty sometimes.  Notes decreased bloading.  OM-  Still having a lot drainage from the left ear.  Went to the ED on 03/07/18.  She was prescribed doxycycline.  Reports improvement in the pain.    Review of Systems See HPI Past Medical History:  Diagnosis Date  . History of chicken pox   . POTS (postural orthostatic tachycardia syndrome)      Social History   Socioeconomic History  . Marital status: Single    Spouse name: Not on file  . Number of children: Not on file  . Years of education: Not on file  . Highest education level: Not on file  Occupational History  . Not on file  Social Needs  . Financial resource strain: Not on file  . Food insecurity:    Worry: Not on file    Inability: Not on file  . Transportation needs:    Medical: Not on file    Non-medical: Not on file  Tobacco Use  . Smoking status: Never Smoker  . Smokeless tobacco: Never Used  Substance and Sexual Activity  . Alcohol use: No  . Drug use: No  . Sexual activity: Yes    Birth control/protection: None  Lifestyle  . Physical activity:    Days per week: Not on file    Minutes per session: Not on file  . Stress: Not on file  Relationships  . Social connections:    Talks on phone: Not on file    Gets together: Not on file    Attends religious service: Not on file    Active member of club or organization: Not on file    Attends meetings of clubs or organizations: Not on file    Relationship status: Not on file  . Intimate partner violence:    Fear of current or ex partner: Not on file    Emotionally abused: Not on file    Physically abused: Not on file    Forced sexual activity: Not on file  Other Topics Concern  . 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 TONSILLECTOMY AND ADENOIDECTOMY  1997    Family History  Problem Relation Age of Onset  . Osteoporosis Mother   . Hyperlipidemia Father   . Heart disease Father        CAD/defibrillator age 34  . Hypertension Father   . Diabetes Father        type 2    Allergies  Allergen Reactions  . Gluten Meal     Current Outpatient Medications on File Prior to Visit  Medication Sig Dispense Refill  . benzonatate (TESSALON) 100 MG capsule Take 1 capsule (100 mg total) by mouth every 8 (eight) hours. 21 capsule 0  . doxycycline (VIBRAMYCIN) 100 MG capsule Take 1 capsule (100 mg total) by mouth 2 (two) times daily. 14 capsule 0  . tacrolimus (PROTOPIC) 0.1 % ointment Apply topically 2 (two) times daily. 100  g 0   No current facility-administered medications on file prior to visit.     BP (!) 142/70 (BP Location: Right Arm, Cuff Size: Normal)   Pulse 75   Temp 97.9 F (36.6 C) (Oral)   Resp 16   Ht 5\' 6"  (1.676 m)   Wt 121 lb 9.6 oz (55.2 kg)   LMP 02/13/2018   SpO2 100%   BMI 19.63 kg/m       Objective:   Physical Exam  Constitutional: She is oriented to person, place, and time. She appears well-developed and well-nourished.  HENT:  Head: Normocephalic and atraumatic.  Right Ear: Tympanic membrane and ear canal normal.  Ears:  Scabbed lesion in the middle of the left tympanic membrane, some dried blood in the left ear canal  Neck: Neck supple. No thyromegaly present.  Cardiovascular: Normal rate, regular rhythm and normal heart sounds.  No murmur heard. Pulmonary/Chest: Effort normal and breath sounds normal. No respiratory distress. She has no wheezes.  Neurological: She is alert and oriented to person, place, and time.  Skin: Skin is warm and dry.    Psychiatric: She has a normal mood and affect. Her behavior is normal. Judgment and thought content normal.          Assessment & Plan:  L Otitis Media- appears to have ruptured her left TM but it appears to be healing.Pt is advised to complete doxycycline.   C diff colitis- clinically resolved following rx with vancomycin. Recommended that she add a probiotic once daily and reschedule her appointment with GI.

## 2018-03-09 NOTE — Patient Instructions (Signed)
Please continue doxycycline. Call if ear does not continue to improve. Add a probiotic once daily. Reschedule your appointment with GI.

## 2018-03-16 ENCOUNTER — Encounter: Payer: PRIVATE HEALTH INSURANCE | Admitting: Family

## 2018-03-21 ENCOUNTER — Ambulatory Visit: Payer: PRIVATE HEALTH INSURANCE | Admitting: Medical

## 2018-03-21 ENCOUNTER — Encounter: Payer: Self-pay | Admitting: Medical

## 2018-03-21 ENCOUNTER — Ambulatory Visit (INDEPENDENT_AMBULATORY_CARE_PROVIDER_SITE_OTHER): Payer: PRIVATE HEALTH INSURANCE | Admitting: Medical

## 2018-03-21 VITALS — BP 116/59 | HR 71 | Temp 98.6°F | Resp 16 | Ht 66.0 in | Wt 120.0 lb

## 2018-03-21 DIAGNOSIS — R059 Cough, unspecified: Secondary | ICD-10-CM

## 2018-03-21 DIAGNOSIS — R0981 Nasal congestion: Secondary | ICD-10-CM | POA: Diagnosis not present

## 2018-03-21 DIAGNOSIS — R05 Cough: Secondary | ICD-10-CM | POA: Diagnosis not present

## 2018-03-21 DIAGNOSIS — J029 Acute pharyngitis, unspecified: Secondary | ICD-10-CM

## 2018-03-21 LAB — POCT RAPID STREP A (OFFICE): Rapid Strep A Screen: NEGATIVE

## 2018-03-21 MED ORDER — BENZONATATE 100 MG PO CAPS: 100.0000 mg | ORAL_CAPSULE | Freq: Three times a day (TID) | ORAL | 0 refills | Status: DC | PRN

## 2018-03-21 MED ORDER — DOXYCYCLINE HYCLATE 100 MG PO TABS: 100.0000 mg | ORAL_TABLET | Freq: Two times a day (BID) | ORAL | 0 refills | Status: DC

## 2018-03-21 MED ORDER — FLUTICASONE PROPIONATE 50 MCG/ACT NA SUSP: 2.0000 | Freq: Every day | NASAL | 1 refills | Status: DC

## 2018-03-21 MED ORDER — METHYLPREDNISOLONE ACETATE 40 MG/ML IJ SUSP
40.0000 mg | Freq: Once | INTRAMUSCULAR | Status: AC
Start: 2018-03-21 — End: 2018-03-21
  Administered 2018-03-21: 40 mg via INTRAMUSCULAR

## 2018-03-21 NOTE — Progress Notes (Signed)
Subjective:    Patient ID: Debbie [name], female    DOB: 09/05/1985, 32 y.o.   MRN: [id number]  HPI  Pt states states ear pressure both side since she had ear infection/rupture tm on left side.(this occurred in November.)  This Saturday got st, and nasal congestion. This past week rt ear  Pressure little worse. Faint sinus pain. Pt wants to make sure left TM not getting reinfected.(although now recovering from rupture tm)  No fever, no chills or sweats.  Pt has frustration with ear pressure since thanksgiving.  Pt does have remote history of c dif on prior test mid November. She states after tx stomach feels better.  LMP- March 15, 2018.   Review of Systems  Constitutional: Negative for chills, fatigue and fever.  HENT: Positive for congestion, ear pain and sore throat.        Pt states around thanksgiving she had ear infection followed by rupture tm Pt followed up with pcp.   Respiratory: Negative for cough, chest tightness, shortness of breath and wheezing.   Cardiovascular: Negative for chest pain and palpitations.  Gastrointestinal: Negative for abdominal pain.  Musculoskeletal: Negative for back pain, joint swelling and neck stiffness.  Skin: Negative for rash.  Neurological: Negative for dizziness, speech difficulty and headaches.  Hematological: Negative for adenopathy. Does not bruise/bleed easily.  Psychiatric/Behavioral: Negative for behavioral problems. The patient is not nervous/anxious.     Past Medical History:  Diagnosis Date  . History of chicken pox   . POTS (postural orthostatic tachycardia syndrome)      Social History   Socioeconomic History  . Marital status: Single    Spouse name: Not on file  . Number of children: Not on file  . Years of education: Not on file  . Highest education level: Not on file  Occupational History  . Not on file  Social Needs  . Financial resource strain: Not on file  . Food insecurity:    Worry: Not on  file    Inability: Not on file  . Transportation needs:    Medical: Not on file    Non-medical: Not on file  Tobacco Use  . Smoking status: Never Smoker  . Smokeless tobacco: Never Used  Substance and Sexual Activity  . Alcohol use: No  . Drug use: No  . Sexual activity: Yes    Birth control/protection: None  Lifestyle  . Physical activity:    Days per week: Not on file    Minutes per session: Not on file  . Stress: Not on file  Relationships  . Social connections:    Talks on phone: Not on file    Gets together: Not on file    Attends religious service: Not on file    Active member of club or organization: Not on file    Attends meetings of clubs or organizations: Not on file    Relationship status: Not on file  . Intimate partner violence:    Fear of current or ex partner: Not on file    Emotionally abused: Not on file    Physically abused: Not on file    Forced sexual activity: Not on file  Other Topics Concern  . 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  TONSILLECTOMY AND ADENOIDECTOMY  1997    Family History  Problem Relation Age of Onset  . Osteoporosis Mother   . Hyperlipidemia Father   . Heart disease Father        CAD/defibrillator age 34  . Hypertension Father   . Diabetes Father        type 2    Allergies  Allergen Reactions  . Gluten Meal     Current Outpatient Medications on File Prior to Visit  Medication Sig Dispense Refill  . tacrolimus (PROTOPIC) 0.1 % ointment Apply topically 2 (two) times daily. 100 g 0   No current facility-administered medications on file prior to visit.     BP (!) 116/59 (BP Location: Left Arm, Patient Position: Sitting, Cuff Size: Small)   Pulse 71   Temp 98.6 F (37 C) (Oral)   Resp 16   Ht 5\' 6"  (1.676 m)   Wt 120 lb (54.4 kg)    LMP 03/15/2018   SpO2 100%   BMI 19.37 kg/m       Objective:   Physical Exam  General  Mental Status - Alert. General Appearance - Well groomed. Not in acute distress.  Skin Rashes- No Rashes.  HEENT Head- Normal. Ear Auditory Canal - Left- Normal. Right - Normal.Tympanic Membrane- Left- normal appearance but 1/3 portion looks to have scab type formation over prior perforation site. Right- Normal. Eye Sclera/Conjunctiva- Left- Normal. Right- Normal. Nose & Sinuses Nasal Mucosa- Left-  Boggy and Congested. Right-  Boggy and  Congested.Bilateral  No maxillary and no  frontal sinus pressure. Mouth & Throat Lips: Upper Lip- Normal: no dryness, cracking, pallor, cyanosis, or vesicular eruption. Lower Lip-Normal: no dryness, cracking, pallor, cyanosis or vesicular eruption. Buccal Mucosa- Bilateral- No Aphthous ulcers. Oropharynx- No Discharge or Erythema. Tonsils: Characteristics- Bilateral- No Erythema or Congestion. Size/Enlargement- Bilateral- no  enlargement. Discharge- bilateral-None.  Neck Neck- Supple. No Masses.   Chest and Lung Exam Auscultation: Breath Sounds:-Clear even and unlabored.  Cardiovascular Auscultation:Rythm- Regular, rate and rhythm. Murmurs & Other Heart Sounds:Ausculatation of the heart reveal- No Murmurs.  Lymphatic Head & Neck General Head & Neck Lymphatics: Bilateral: Description- faint submandibular node enlargement.non tender.       Assessment & Plan:  For your recent nasal congestion, eustachian tube pressure, sore throat and faint sinus pressure, we gave you Depo-Medrol 40 mg IM.  I thought this would be beneficial as you report constant type ear pressure since mid November.  Also start Flonase nasal spray.  For cough, use benzonatate.  No obvious indication for antibiotic at this time.  However we are approaching holiday week and you will be traveling out of state.  If you develop any obvious sinus infection type pain or have recurrent  ear pain then can start doxycycline antibiotic.  Print Rx given today.  You do have history of some positive Clostridium difficile test in November so would be hesitant to use antibiotics unless clearly indicated.  Follow-up in 7 to 10 days or as needed.  Esperanza RichtersEdward Dorlisa Savino, PA-C

## 2018-03-21 NOTE — Patient Instructions (Signed)
For your recent nasal congestion, eustachian tube pressure, sore throat and faint sinus pressure, we gave you Depo-Medrol 40 mg IM.  I thought this would be beneficial as you report constant type ear pressure since mid November.  Also start Flonase nasal spray.  For cough, use benzonatate.  No obvious indication for antibiotic at this time.  However we are approaching holiday week and you will be traveling out of state.  If you develop any obvious sinus infection type pain or have recurrent ear pain then can start doxycycline antibiotic.  Print Rx given today.  You do have history of some positive Clostridium difficile test in November so would be hesitant to use antibiotics unless clearly indicated.  Follow-up in 7 to 10 days or as needed.

## 2018-04-09 ENCOUNTER — Encounter: Payer: Self-pay | Admitting: Gastroenterology

## 2018-04-09 ENCOUNTER — Ambulatory Visit: Payer: PRIVATE HEALTH INSURANCE | Admitting: Gastroenterology

## 2018-04-09 VITALS — BP 122/68 | HR 40 | Ht 66.0 in | Wt 120.5 lb

## 2018-04-09 DIAGNOSIS — Z8619 Personal history of other infectious and parasitic diseases: Secondary | ICD-10-CM

## 2018-04-09 NOTE — Progress Notes (Signed)
Chief Complaint:   Referring Provider:  Esperanza RichtersSaguier, Edward, PA-C      ASSESSMENT AND PLAN;   #1. H/O C. Diff 02/2018-treated with vancomycin, resolved.  #2. H/O S/O gluten sensitivity. Doubt celiac.  Plan: - Avoid antibiotics. Stop protopic for now. - She has appt with Dr. Lucie LeatherKozlow (for possible allergies) - Continue gluten free diet and probiotics for now. - FU in 12 weeks - No indication for endocopic procedures or repeat testing for C. Difficile as there is no further diarrhea.   HPI:    Debbie [name] is a 33 y.o. female  Feels better No diarrhea or any abdominal pain. No nausea, vomiting, heartburn, regurgitation, odynophagia or dysphagia.  No constipation.  There is no melena or hematochezia. No further weight loss.   She has been on multiple antibiotics including Cipro, Flagyl and doxycycline.  Tested positive for C. difficile.  Has been treated with vancomycin for 10 days.  She only had minimal diarrhea before.  None now.  For the last 1 year-she has more problems with chicken which causes her to have abdominal bloating, and occasionally pork.  She has stopped eating chicken/pork but can handle eggs.  She has appointment with allergist in the next few days.  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 History of chicken pox   . POTS (postural orthostatic tachycardia syndrome)     Past Surgical History:  Procedure Laterality Date  . TONSILLECTOMY AND ADENOIDECTOMY  1997  . WISDOM TOOTH EXTRACTION     all 4    Family History  Problem Relation Age of Onset  . Osteoporosis Mother   . Hyperlipidemia Father   . Heart disease Father        CAD/defibrillator age 35  . Hypertension Father   . Diabetes Father        type 2  . Colon cancer Neg Hx   . Esophageal cancer Neg Hx   . Stomach cancer Neg Hx     Social History   Tobacco Use  . Smoking status: Never Smoker  . Smokeless tobacco: Never Used  Substance  Use Topics  . Alcohol use: No  . Drug use: No    Current Outpatient Medications  Medication Sig Dispense Refill  . tacrolimus (PROTOPIC) 0.1 % ointment Apply topically 2 (two) times daily. 100 g 0  . fluticasone (FLONASE) 50 MCG/ACT nasal spray Place 2 sprays into both nostrils daily. (Patient not taking: Reported on 04/09/2018) 16 g 1   No current facility-administered medications for this visit.     Allergies  Allergen Reactions  . Gluten Meal     Review of Systems:  Constitutional: Denies fever, chills, diaphoresis, appetite change and fatigue.  HEENT: Denies photophobia, eye pain, redness, hearing loss, ear pain, congestion, sore throat, rhinorrhea, sneezing, mouth sores, neck pain, neck stiffness and tinnitus.   Respiratory: Denies SOB, DOE, cough, chest tightness,  and wheezing.   Cardiovascular: Denies chest pain, palpitations and leg swelling.  Genitourinary: Denies dysuria, urgency, frequency, hematuria, flank pain and difficulty urinating.  Musculoskeletal: Denies myalgias, back pain, joint swelling, arthralgias and gait problem.  Skin: No rash.  Neurological: Denies dizziness, seizures, syncope, weakness, light-headedness, numbness and headaches.  Hematological: Denies adenopathy. Easy bruising, personal or family bleeding history  Psychiatric/Behavioral: No anxiety or depression     Physical Exam:    BP 122/68   Pulse (!) 40   Ht 5\' 6"  (1.676 m)   Wt 120  lb 8 oz (54.7 kg)   LMP 03/15/2018   BMI 19.45 kg/m  Filed Weights   04/09/18 0954  Weight: 120 lb 8 oz (54.7 kg)   Constitutional:  Well-developed, in no acute distress. Psychiatric: Normal mood and affect. Behavior is normal. HEENT: Pupils normal.  Conjunctivae are normal. No scleral icterus. Neck supple.  Cardiovascular: Normal rate, regular rhythm. No edema Pulmonary/chest: Effort normal and breath sounds normal. No wheezing, rales or rhonchi. Abdominal: Soft, nondistended. Nontender. Bowel sounds  active throughout. There are no masses palpable. No hepatomegaly. Rectal:  defered Neurological: Alert and oriented to person place and time. Skin: Skin is warm and dry. No rashes noted.  Data Reviewed: I have personally reviewed following labs and imaging studies  CBC: CBC Latest Ref Rng & Units 11/02/2015  WBC 4.0 - 10.5 K/uL 5.8  Hemoglobin 12.0 - 15.0 g/dL 49.1  Hematocrit 79.1 - 46.0 % 40.0  Platelets 150.0 - 400.0 K/uL 217.0    CMP: CMP Latest Ref Rng & Units 01/29/2018 11/02/2015  Glucose 70 - 99 mg/dL 505(W) 81  BUN 6 - 23 mg/dL 10 18  Creatinine 9.79 - 1.20 mg/dL 4.80 1.65  Sodium 537 - 145 mEq/L 138 138  Potassium 3.5 - 5.1 mEq/L 4.1 3.8  Chloride 96 - 112 mEq/L 103 103  CO2 19 - 32 mEq/L 27 27  Calcium 8.4 - 10.5 mg/dL 9.3 9.3  Total Protein 6.0 - 8.3 g/dL 6.5 7.2  Total Bilirubin 0.2 - 1.2 mg/dL 0.7 0.5  Alkaline Phos 39 - 117 U/L 58 44  AST 0 - 37 U/L 14 21  ALT 0 - 35 U/L 11 17      Edman Circle, MD 04/09/2018, 10:07 AM  Cc: Esperanza Richters, PA-C

## 2018-04-09 NOTE — Patient Instructions (Signed)
If you are age 35 or older, your body mass index should be between 23-30. Your Body mass index is 19.45 kg/m. If this is out of the aforementioned range listed, please consider follow up with your Primary Care Provider.  If you are age 70 or younger, your body mass index should be between 19-25. Your Body mass index is 19.45 kg/m. If this is out of the aformentioned range listed, please consider follow up with your Primary Care Provider.   Thank you,  Dr. Lynann Bologna

## 2018-05-18 ENCOUNTER — Encounter: Payer: Self-pay | Admitting: Family

## 2018-05-18 ENCOUNTER — Ambulatory Visit (INDEPENDENT_AMBULATORY_CARE_PROVIDER_SITE_OTHER): Payer: PRIVATE HEALTH INSURANCE | Admitting: Family

## 2018-05-18 VITALS — BP 135/85 | HR 61 | Temp 97.9°F | Resp 16 | Ht 66.0 in | Wt 121.0 lb

## 2018-05-18 DIAGNOSIS — T148XXA Other injury of unspecified body region, initial encounter: Secondary | ICD-10-CM

## 2018-05-18 NOTE — Progress Notes (Signed)
Subjective:    Patient ID: Debbie [name], female    DOB: [birth year], 33 y.o.   MRN: [id number]  HPI  Patient is a 33 yr old female who presents today following right knee injury. Reports that she tripped while running the other day and has an abrasion on her right knee.  She was afraid that it might be getting infected but worried about need to take abx since her GI advised her against antibiotic use in the next 1 year given her c diff infection.    Review of Systems See HPI  Past Medical History:  Diagnosis Date  . Abnormal heart rhythm   . History of chicken pox   . POTS (postural orthostatic tachycardia syndrome)      Social History   Socioeconomic History  . Marital status: Single    Spouse name: Not on file  . Number of children: Not on file  . Years of education: Not on file  . Highest education level: Not on file  Occupational History  . Not on file  Social Needs  . Financial resource strain: Not on file  . Food insecurity:    Worry: Not on file    Inability: Not on file  . Transportation needs:    Medical: Not on file    Non-medical: Not on file  Tobacco Use  . Smoking status: Never Smoker  . Smokeless tobacco: Never Used  Substance and Sexual Activity  . Alcohol use: No  . Drug use: No  . Sexual activity: Yes    Birth control/protection: None  Lifestyle  . Physical activity:    Days per week: Not on file    Minutes per session: Not on file  . Stress: Not on file  Relationships  . Social connections:    Talks on phone: Not on file    Gets together: Not on file    Attends religious service: Not on file    Active member of club or organization: Not on file    Attends meetings of clubs or organizations: Not on file    Relationship status: Not on file  . Intimate partner violence:    Fear of current or ex partner: Not on file    Emotionally abused: Not on file    Physically abused: Not on file    Forced sexual activity: Not on file  Other  Topics Concern  . 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 TONSILLECTOMY AND ADENOIDECTOMY  1997  . WISDOM TOOTH EXTRACTION     all 4    Family History  Problem Relation Age of Onset  . Osteoporosis Mother   . Hyperlipidemia Father   . Heart disease Father        CAD/defibrillator age 64  . Hypertension Father   . Diabetes Father        type 2  . Colon cancer Neg Hx   . Esophageal cancer Neg Hx   . Stomach cancer Neg Hx     Allergies  Allergen Reactions  . Gluten Meal     No current outpatient medications on file prior to visit.   No current facility-administered medications on file prior to visit.     BP 135/85 (BP Location: Right Arm, Patient Position:  Sitting, Cuff Size: Small)   Pulse 61   Temp 97.9 F (36.6 C) (Oral)   Resp 16   Ht 5\' 6"  (1.676 m)   Wt 121 lb (54.9 kg)   SpO2 100%   BMI 19.53 kg/m       Objective:   Physical Exam Constitutional:      Appearance: She is well-developed.  Neck:     Thyroid: No thyromegaly.  Skin:    General: Skin is warm and dry.     Comments: Approximately 1 cm wide pink abrasion noted on right knee. Skin is intact.     Neurological:     Mental Status: She is alert and oriented to person, place, and time.  Psychiatric:        Behavior: Behavior normal.        Thought Content: Thought content normal.        Judgment: Judgment normal.           Assessment & Plan:  Abrasion- no sign of infection. No indication for systemic abx. Advised pt to apply antibiotic ointment. Call if increased redness/swelling or pain. Advised pt to follow up as needed. She plans to schedule cpx this summer.

## 2018-07-02 ENCOUNTER — Telehealth: Payer: Self-pay | Admitting: Gastroenterology

## 2018-07-02 NOTE — Telephone Encounter (Signed)
Pt called in wanting to schedule an appt she stated an urgent matter. She is having stomach pain,small diarrhea

## 2018-07-02 NOTE — Telephone Encounter (Signed)
Patient reports that the abdominal pain and loose stool from January has returned.  In January she was diagnosed with C-diff.  She reports that she does not have diarrhea, but is having more stools than normal and they are loose.  She will have a Webex visit with Dr. Chales Abrahams tomorrow at 3:15

## 2018-07-03 ENCOUNTER — Telehealth (INDEPENDENT_AMBULATORY_CARE_PROVIDER_SITE_OTHER): Payer: PRIVATE HEALTH INSURANCE | Admitting: Gastroenterology

## 2018-07-03 ENCOUNTER — Encounter: Payer: Self-pay | Admitting: Gastroenterology

## 2018-07-03 ENCOUNTER — Other Ambulatory Visit: Payer: Self-pay

## 2018-07-03 DIAGNOSIS — Z8619 Personal history of other infectious and parasitic diseases: Secondary | ICD-10-CM | POA: Diagnosis not present

## 2018-07-03 DIAGNOSIS — R1030 Lower abdominal pain, unspecified: Secondary | ICD-10-CM | POA: Diagnosis not present

## 2018-07-03 MED ORDER — DICYCLOMINE HCL 10 MG PO CAPS: 10.0000 mg | ORAL_CAPSULE | Freq: Two times a day (BID) | ORAL | 3 refills | Status: DC | PRN

## 2018-07-03 NOTE — Patient Instructions (Signed)
We have sent the following medications to your pharmacy for you to pick up at your convenience: Bentyl 10 mg   Please go to [facility] to have your stool specimen.   Thank you,  Dr. Lynann Bologna

## 2018-07-03 NOTE — Progress Notes (Signed)
Chief Complaint: Abdominal pain  Referring Provider:  Sandford Craze, NP      Video- Visit in Setting of COVID19  Patient has given consent for a tele- visit today and understands that is the same as is required for any face-to-face patient encounter except that the service was provided via telephone/zoom.   At the time of this telephonic visit the patient was located at home and I, the Provider, at the office of [facility], [place].   Total time with coordination of care: 25 min   ASSESSMENT AND PLAN;   #1. H/O C. Diff 02/2018-treated with vancomycin, resolved.  #2. H/O S/O gluten sensitivity. Doubt celiac. Neg allergy testing  #3. Lower Abdo pain  Plan: -Check stool for C. Difficile (swab) -Continue gluten free diet and probiotics for now. -Trial of Bentyl 10 mg p.o. twice daily #60.  Take it half an hour before supper and bedtime PRN. -If she continues to have abdominal pain, would check labs including CBC, CMP, proceed with CT abdo/pelvis followed by colonoscopy if needed.   HPI:    Debbie [name] is a 33 y.o. female  With lower abdominal pain -similar to previously when she had C. Difficile.  Was bad over the weekend.  Describes this as crampy abdominal pain without any definite exacerbating factors. No diarrhea (did not have diarrhea previously as well) Did feel bloated. Has been taking yogurt once per day. No recent antibiotics. No nausea, vomiting, heartburn, regurgitation, odynophagia or dysphagia.  No constipation.  There is no melena or hematochezia. No weight loss.    Had C. difficile 02/2018-treated with vancomycin for 10 days.  For the last 1 year-she has more problems with chicken which causes her to have abdominal bloating, and occasionally pork.  She has stopped eating chicken/pork but can handle eggs.  Allergy testing was negative.  No fever or chills.  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 Abnormal heart rhythm   . History of chicken pox   . POTS (postural orthostatic tachycardia syndrome)     Past Surgical History:  Procedure Laterality Date  . TONSILLECTOMY AND ADENOIDECTOMY  1997  . WISDOM TOOTH EXTRACTION     all 4    Family History  Problem Relation Age of Onset  . Osteoporosis Mother   . Hyperlipidemia Father   . Heart disease Father        CAD/defibrillator age 90  . Hypertension Father   . Diabetes Father        type 2  . Colon cancer Neg Hx   . Esophageal cancer Neg Hx   . Stomach cancer Neg Hx     Social History   Tobacco Use  . Smoking status: Never Smoker  . Smokeless tobacco: Never Used  Substance Use Topics  . Alcohol use: No  . Drug use: No    No current outpatient medications on file.   No current facility-administered medications for this visit.     Allergies  Allergen Reactions  . Gluten Meal     Review of Systems:  neg     Physical Exam:    Not examined since it was a tele visit  Data Reviewed: I have personally reviewed following labs and imaging studies  CBC: CBC Latest Ref Rng & Units 11/02/2015  WBC 4.0 - 10.5 K/uL 5.8  Hemoglobin 12.0 - 15.0 g/dL 83.8  Hematocrit 18.4 - 46.0 % 40.0  Platelets 150.0 - 400.0  K/uL 217.0    CMP: CMP Latest Ref Rng & Units 01/29/2018 11/02/2015  Glucose 70 - 99 mg/dL 025(K) 81  BUN 6 - 23 mg/dL 10 18  Creatinine 2.70 - 1.20 mg/dL 6.23 7.62  Sodium 831 - 145 mEq/L 138 138  Potassium 3.5 - 5.1 mEq/L 4.1 3.8  Chloride 96 - 112 mEq/L 103 103  CO2 19 - 32 mEq/L 27 27  Calcium 8.4 - 10.5 mg/dL 9.3 9.3  Total Protein 6.0 - 8.3 g/dL 6.5 7.2  Total Bilirubin 0.2 - 1.2 mg/dL 0.7 0.5  Alkaline Phos 39 - 117 U/L 58 44  AST 0 - 37 U/L 14 21  ALT 0 - 35 U/L 11 17      Edman Circle, MD 07/03/2018, 3:47 PM  Cc: Sandford Craze, NP

## 2018-07-05 ENCOUNTER — Other Ambulatory Visit: Payer: Self-pay | Admitting: Gastroenterology

## 2018-07-06 LAB — CLOSTRIDIUM DIFFICILE BY PCR: Toxigenic C. Difficile by PCR: NEGATIVE

## 2018-07-10 ENCOUNTER — Telehealth: Payer: Self-pay | Admitting: Gastroenterology

## 2018-07-10 NOTE — Telephone Encounter (Signed)
Patient notified of the negative results. She is encouraged to try the dicyclomine trial and was asked to call back with continued symptoms after taking the medication for a few days.

## 2018-10-26 ENCOUNTER — Ambulatory Visit: Payer: Self-pay | Admitting: Family

## 2018-10-26 NOTE — Telephone Encounter (Signed)
Appt scheduled

## 2018-10-26 NOTE — Telephone Encounter (Signed)
Pt. Reports since April she has had tingling and numbness to her legs when she sits or lays down for periods of time. In the mornings it will feel pins and needles. No weakness. No known injury. The sensation goes away quickly when she gets up and moves around. Warm transfer to Rod Holler in the practice.  Answer Assessment - Initial Assessment Questions 1. SYMPTOM: "What is the main symptom you are concerned about?" (e.g., weakness, numbness)     Legs feel like they fall asleep 2. ONSET: "When did this start?" (minutes, hours, days; while sleeping)     A few months ago 3. LAST NORMAL: "When was the last time you were normal (no symptoms)?"     April 4. PATTERN "Does this come and go, or has it been constant since it started?"  "Is it present now?"     Comes and goes 5. CARDIAC SYMPTOMS: "Have you had any of the following symptoms: chest pain, difficulty breathing, palpitations?"     No 6. NEUROLOGIC SYMPTOMS: "Have you had any of the following symptoms: headache, dizziness, vision loss, double vision, changes in speech, unsteady on your feet?"     No 7. OTHER SYMPTOMS: "Do you have any other symptoms?"     Stiffness to legs 8. PREGNANCY: "Is there any chance you are pregnant?" "When was your last menstrual period?"     No  Protocols used: NEUROLOGIC DEFICIT-A-AH

## 2018-10-29 ENCOUNTER — Other Ambulatory Visit: Payer: Self-pay

## 2018-10-29 ENCOUNTER — Telehealth (INDEPENDENT_AMBULATORY_CARE_PROVIDER_SITE_OTHER): Payer: PRIVATE HEALTH INSURANCE | Admitting: Family

## 2018-10-29 DIAGNOSIS — M5416 Radiculopathy, lumbar region: Secondary | ICD-10-CM

## 2018-10-29 MED ORDER — METHYLPREDNISOLONE 4 MG PO TBPK: ORAL_TABLET | ORAL | 0 refills | Status: DC

## 2018-10-29 NOTE — Progress Notes (Signed)
Subjective:    Patient ID: Debbie [name], female    DOB: July 30, 1985, 33 y.o.   MRN: [id number]  HPI  Patient is a 33 yr old female who presents today with chief complaint of LE numbness and tingling. Notes that symptoms have been present for several weeks.  Has some tingling that starts in the buttocks and radiates down both legs. Denies associated LE weakness, pain, bowel or bladder incontinence. Denies any other numbness elsewhere in her body.  Reports that symptoms are worsened by laying flat.  Wearing support hose and "walking" help relieve the numbness. Notes that she has been sitting more since she has been working at home.    Review of Systems    see HPI  Past Medical History:  Diagnosis Date  . Abnormal heart rhythm   . History of chicken pox   . POTS (postural orthostatic tachycardia syndrome)      Social History   Socioeconomic History  . Marital status: Single    Spouse name: Not on file  . Number of children: Not on file  . Years of education: Not on file  . Highest education level: Not on file  Occupational History  . Not on file  Social Needs  . Financial resource strain: Not on file  . Food insecurity    Worry: Not on file    Inability: Not on file  . Transportation needs    Medical: Not on file    Non-medical: Not on file  Tobacco Use  . Smoking status: Never Smoker  . Smokeless tobacco: Never Used  Substance and Sexual Activity  . Alcohol use: No  . Drug use: No  . Sexual activity: Yes    Birth control/protection: None  Lifestyle  . Physical activity    Days per week: Not on file    Minutes per session: Not on file  . Stress: Not on file  Relationships  . Social Musicianconnections    Talks on phone: Not on file    Gets together: Not on file    Attends religious service: Not on file    Active member of club or organization: Not on file    Attends meetings of clubs or organizations: Not on file    Relationship status: Not on file  .  Intimate partner violence    Fear of current or ex partner: Not on file    Emotionally abused: Not on file    Physically abused: Not on file    Forced sexual activity: Not on file  Other Topics Concern  . 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 TONSILLECTOMY AND ADENOIDECTOMY  1997  . WISDOM TOOTH EXTRACTION     all 4    Family History  Problem Relation Age of Onset  . Osteoporosis Mother   . Hyperlipidemia Father   . Heart disease Father        CAD/defibrillator age 35  . Hypertension Father   . Diabetes Father        type 2  . Colon cancer Neg Hx   . Esophageal cancer Neg Hx   . Stomach cancer Neg Hx     Allergies  Allergen Reactions  . Gluten Meal     No current outpatient medications  on file prior to visit.   No current facility-administered medications on file prior to visit.     There were no vitals taken for this visit.    Objective:   Physical Exam  Gen: Awake, alert, no acute distress Resp: Breathing is even and non-labored Psych: calm/pleasant demeanor Neuro: Alert and Oriented x 3, + facial symmetry, speech is clear.   Assessment & Plan:  Lumbar radiculopathy- symptoms most consistent with lumbar radiculopathy though she is having more numbness than pain. Will rx with a medrol dose pak. She is advised to call if symptoms worsen or if not resolved in 1 week.

## 2018-11-05 ENCOUNTER — Encounter: Payer: Self-pay | Admitting: Family

## 2018-11-06 NOTE — Telephone Encounter (Signed)
Patient's appt moved to this frimday

## 2018-11-09 ENCOUNTER — Ambulatory Visit (INDEPENDENT_AMBULATORY_CARE_PROVIDER_SITE_OTHER): Payer: PRIVATE HEALTH INSURANCE | Admitting: Family

## 2018-11-09 ENCOUNTER — Other Ambulatory Visit: Payer: Self-pay

## 2018-11-09 DIAGNOSIS — R202 Paresthesia of skin: Secondary | ICD-10-CM | POA: Diagnosis not present

## 2018-11-09 DIAGNOSIS — M5410 Radiculopathy, site unspecified: Secondary | ICD-10-CM | POA: Diagnosis not present

## 2018-11-09 LAB — FOLATE: Folate: 7.5 ng/mL

## 2018-11-09 LAB — VITAMIN B12: Vitamin B-12: 577 pg/mL (ref 200–1100)

## 2018-11-09 LAB — TSH: TSH: 0.97 mIU/L

## 2018-11-09 NOTE — Progress Notes (Signed)
Subjective:    Patient ID: Debbie [name], female    DOB: Sep 11, 1985, 33 y.o.   MRN: [id number]  HPI   Patient is a 33 year old female who presents today to discuss ongoing lower extremity numbness. Reports that symptoms began in April.  Waxes and wanes but has become more prominent recently.  We saw her for virtual visit back on July 27 and treated her with a Medrol Dosepak.  She noted no significant improvement with the Medrol Dosepak.  She notes that symptoms are worst at night when she is sleeping.  The numbness and tingling will sometimes wake her up out of bed.  She will have to get up and walk around to improve the symptoms.  Sometimes she will have numbness in her calf sometimes it will be down the back of her legs.  Sometimes will be in the front of her thighs.  She started wearing some compression tights that she had on hand while she is sleeping and this seems to help some.  Reports some chronic low back pain.  Denies bowel/bladder incontinence. Denies falls.  Denies headaches/visual changes.  She denies any upper extremity paresthesia.  Review of Systems See HPI  Past Medical History:  Diagnosis Date  . Abnormal heart rhythm   . History of chicken pox   . POTS (postural orthostatic tachycardia syndrome)      Social History   Socioeconomic History  . Marital status: Single    Spouse name: Not on file  . Number of children: Not on file  . Years of education: Not on file  . Highest education level: Not on file  Occupational History  . Not on file  Social Needs  . Financial resource strain: Not on file  . Food insecurity    Worry: Not on file    Inability: Not on file  . Transportation needs    Medical: Not on file    Non-medical: Not on file  Tobacco Use  . Smoking status: Never Smoker  . Smokeless tobacco: Never Used  Substance and Sexual Activity  . Alcohol use: No  . Drug use: No  . Sexual activity: Yes    Birth control/protection: None  Lifestyle  .  Physical activity    Days per week: Not on file    Minutes per session: Not on file  . Stress: Not on file  Relationships  . Social Musicianconnections    Talks on phone: Not on file    Gets together: Not on file    Attends religious service: Not on file    Active member of club or organization: Not on file    Attends meetings of clubs or organizations: Not on file    Relationship status: Not on file  . Intimate partner violence    Fear of current or ex partner: Not on file    Emotionally abused: Not on file    Physically abused: Not on file    Forced sexual activity: Not on file  Other Topics Concern  . 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 TONSILLECTOMY AND ADENOIDECTOMY  1997  . WISDOM TOOTH EXTRACTION     all 4    Family History  Problem Relation Age of Onset  . Osteoporosis Mother   .  Hyperlipidemia Father   . Heart disease Father        CAD/defibrillator age 29  . Hypertension Father   . Diabetes Father        type 2  . Colon cancer Neg Hx   . Esophageal cancer Neg Hx   . Stomach cancer Neg Hx     Allergies  Allergen Reactions  . Gluten Meal     No current outpatient medications on file prior to visit.   No current facility-administered medications on file prior to visit.     There were no vitals taken for this visit.      Objective:   Physical Exam Constitutional:      Appearance: She is well-developed.  Neck:     Musculoskeletal: Neck supple.     Thyroid: No thyromegaly.  Cardiovascular:     Rate and Rhythm: Normal rate and regular rhythm.     Heart sounds: Normal heart sounds. No murmur.  Pulmonary:     Effort: Pulmonary effort is normal. No respiratory distress.     Breath sounds: Normal breath sounds. No wheezing.  Musculoskeletal:     Cervical back: She exhibits  no tenderness.     Thoracic back: She exhibits no tenderness.     Lumbar back: She exhibits no tenderness.  Skin:    General: Skin is warm and dry.  Neurological:     Mental Status: She is alert and oriented to person, place, and time.     Deep Tendon Reflexes:     Reflex Scores:      Patellar reflexes are 3+ on the right side and 3+ on the left side.    Comments: Bilateral LE strength is 5/5  Psychiatric:        Behavior: Behavior normal.        Thought Content: Thought content normal.        Judgment: Judgment normal.           Assessment & Plan:  Bilateral LE paresthesia- need to rule out lumbar disc disease/lesion. Obtain b12, folate, tsh.  If MRI wnl would consider referral to neurology for further evaluation. She is counseled on red flags that should prompt her to go to the ED such as new onset LE weakness, worsening numbness, bowel/bladder incontinence.

## 2018-11-09 NOTE — Patient Instructions (Signed)
Please complete la work prior to leaving. You should be contacted about your MRI scheduling once we have approval.

## 2018-11-13 ENCOUNTER — Ambulatory Visit: Payer: PRIVATE HEALTH INSURANCE | Admitting: Family

## 2018-11-14 ENCOUNTER — Telehealth: Payer: Self-pay | Admitting: Family

## 2018-11-14 DIAGNOSIS — R202 Paresthesia of skin: Secondary | ICD-10-CM

## 2018-11-14 NOTE — Telephone Encounter (Signed)
Received denial letter from insurance for MRI.  Left message on pt cell to check  mychart message.

## 2018-11-30 ENCOUNTER — Encounter: Payer: Self-pay | Admitting: Neurology

## 2018-11-30 ENCOUNTER — Telehealth (INDEPENDENT_AMBULATORY_CARE_PROVIDER_SITE_OTHER): Payer: PRIVATE HEALTH INSURANCE | Admitting: Neurology

## 2018-11-30 ENCOUNTER — Other Ambulatory Visit: Payer: Self-pay

## 2018-11-30 VITALS — Ht 66.0 in | Wt 120.0 lb

## 2018-11-30 DIAGNOSIS — R202 Paresthesia of skin: Secondary | ICD-10-CM | POA: Diagnosis not present

## 2018-11-30 NOTE — Progress Notes (Signed)
New Patient Virtual Visit via Video Note The purpose of this virtual visit is to provide medical care while limiting exposure to the novel coronavirus.    Consent was obtained for video visit:  Yes.   Answered questions that patient had about telehealth interaction:  Yes.   I discussed the limitations, risks, security and privacy concerns of performing an evaluation and management service by telemedicine. I also discussed with the patient that there may be a patient responsible charge related to this service. The patient expressed understanding and agreed to proceed.  Pt location: Home Physician Location: office Name of referring provider:  Sandford Craze'Sullivan, Melissa, NP I connected with Debbie [name] at patients initiation/request on 11/30/2018 at  2:50 PM EDT by video enabled telemedicine application and verified that I am speaking with the correct person using two identifiers. Pt MRN:  [id number] Pt DOB:  25-Feb-1986 Video Participants:  Debbie [name]    History of Present Illness: Debbie [name] is a 33 y.o. right-handed female presenting for evaluation of abnormal skin sensation.  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  Starting around April 2020, she developed sensation of tingling whenever she is resting her legs on any surface.  For instance, if she is laying in bed on her back, she has numbness over the hamstrings and cough region, if she turns to lay on her stomach, there is numbness over the quads and shin region.  This occurs even if she is laying on her side.  It does not involve her torso or arms.  Sometimes, the numbness wakes her up from sleeping and she has to stand up to stretch.  Symptoms are not as noticeable during the day when she is occupied.  Initially, she felt it was due to sitting on a hard wooden chair at home due to canceling in class sessions and moving to virtual studies.  Over the past 2 weeks, she has returned to work and feels symptoms  are less noticeable during the day.  She denies any weakness of the legs, falls, imbalance, or significant low back pain.  Her primary care provider has check vitamin B12, folate, and TSH which is normal.  Out-side paper records, electronic medical record, and images have been reviewed where available and summarized as:  No results found for: HGBA1C Lab Results  Component Value Date   VITAMINB12 577 11/09/2018   Lab Results  Component Value Date   TSH 0.97 11/09/2018   No results found for: ESRSEDRATE, POCTSEDRATE  Past Medical History:  Diagnosis Date  . Abnormal heart rhythm   . History of chicken pox   . POTS (postural orthostatic tachycardia syndrome)     Past Surgical History:  Procedure Laterality Date  . TONSILLECTOMY AND ADENOIDECTOMY  1997  . WISDOM TOOTH EXTRACTION     all 4     Medications:  No outpatient encounter medications on file as of 11/30/2018.   No facility-administered encounter medications on file as of 11/30/2018.     Allergies:  Allergies  Allergen Reactions  . Gluten Meal     Family History: Family History  Problem Relation Age of Onset  . Osteoporosis Mother   . Hyperlipidemia Father   . Heart disease Father        CAD/defibrillator age 35  . Hypertension Father   . Diabetes Father        type 2  . Colon cancer Neg Hx   . Esophageal cancer Neg Hx   . Stomach cancer Neg  Hx     Social History: Social History   Tobacco Use  . Smoking status: Never Smoker  . Smokeless tobacco: Never Used  Substance Use Topics  . Alcohol use: No  . 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   EYES: No visual changes or eye pain ENT: No hearing changes.  No history of nose bleeds.   RESPIRATORY: No cough,  wheezing and shortness of breath.   CARDIOVASCULAR: Negative for chest pain, and palpitations.   GI: Negative for abdominal discomfort, blood in stools or black stools.  No recent change in bowel habits.   GU:  No history of incontinence.   MUSCLOSKELETAL: No history of joint pain or swelling.  No myalgias.   SKIN: Negative for lesions, rash, and itching.   HEMATOLOGY/ONCOLOGY: Negative for prolonged bleeding, bruising easily, and swollen nodes.  No history of cancer.   ENDOCRINE: Negative for cold or heat intolerance, polydipsia or goiter.   PSYCH:  No depression +anxiety symptoms.   NEURO: As Above.   Vital Signs:  Ht 5\' 6"  (1.676 m)   Wt 120 lb (54.4 kg)   BMI 19.37 kg/m    General Medical Exam:  Well appearing, comfortable.  Nonlabored breathing.  No deformity or edema.  No rash.  Neurological Exam: MENTAL STATUS including orientation to time, place, person, recent and remote memory, attention span and concentration, language, and fund of knowledge is normal.  Speech is not dysarthric.  CRANIAL NERVES:  Normal conjugate, extra-ocular eye movements in all directions of gaze.  No ptosis.  Normal facial symmetry and movements.  Normal shoulder shrug and head rotation.  Tongue is midline.  MOTOR:  Antigravity in all extremities.  No abnormal movements.  No pronator drift.   SENSORY & REFLEXES: Unable to assess  COORDINATION/GAIT: Normal finger to nose bilaterally.  Intact rapid alternating movements bilaterally.  Able to rise from a chair without using arms.  Gait narrow based and stable. Tandem and stressed gait intact.    IMPRESSION: Intermittent and migratory paresthesias of the lower extremities, exacerbated by tactile pressure.  Symptoms do not conform to a cutaneous nerve or dermatomal distribution.  There is no associated weakness.  I reassured patient that this is unlikely to represent anything worrisome such as lumbar radiculopathy, multiple sclerosis, or other nervous  system pathology.  Because she is very concerned and would like to exclude the possibility of compressive neuropathy, I will bring her back to the office for formal neuromuscular exam and electrodiagnostic testing of the legs.    Follow Up Instructions:  I discussed the assessment and treatment plan with the patient. The patient was provided an opportunity to ask questions and all were answered. The patient agreed with the plan and demonstrated an understanding of the instructions.   The patient was advised to call back or seek an in-person evaluation if the symptoms worsen or if the condition fails to improve as anticipated.  Further recommendations pending results.  Total Time spent:  45 min   Alda Berthold, DO

## 2019-01-08 ENCOUNTER — Encounter: Payer: PRIVATE HEALTH INSURANCE | Admitting: Neurology

## 2019-01-10 ENCOUNTER — Ambulatory Visit (INDEPENDENT_AMBULATORY_CARE_PROVIDER_SITE_OTHER): Payer: PRIVATE HEALTH INSURANCE | Admitting: Neurology

## 2019-01-10 ENCOUNTER — Other Ambulatory Visit: Payer: Self-pay

## 2019-01-10 DIAGNOSIS — R202 Paresthesia of skin: Secondary | ICD-10-CM

## 2019-01-10 NOTE — Procedures (Signed)
[facility] Neurology  [address]  [place], [place] [address] Tel: [phone] Fax:  [phone] Test Date:  01/10/2019  Patient: Debbie [name] DOB: October 02, 1985 Physician: Narda Amber, DO  Sex: Female Height: 5\' 6"  Ref Phys: Narda Amber, DO  ID#: [id number] Temp: 32.0C Technician:    Patient Complaints: This is a 33 year old female with episodic bilateral leg numbness.  NCV & EMG Findings: Electrodiagnostic testing of the right lower extremity and additional studies of the left shows: 1. Bilateral sural and superficial peroneal sensory responses are within normal limits. 2. Bilateral peroneal and tibial motor responses are within normal limits. 3. Bilateral tibial H reflex studies are within normal limits. 4. There is no evidence of active or chronic motor axonal changes affecting any of the tested muscles.  Motor unit configuration and recruitment pattern is within normal limits.   Impression: This is a normal study of the lower extremities.  In particular, there is no evidence of a sensorimotor polyneuropathy or lumbosacral radiculopathy.     ___________________________ Narda Amber, DO    Nerve Conduction Studies Anti Sensory Summary Table   Site NR Peak (ms) Norm Peak (ms) P-T Amp (V) Norm P-T Amp  Left Sup Peroneal Anti Sensory (Ant Lat Mall)  32C  12 cm    2.1 <4.5 18.7 >5  Right Sup Peroneal Anti Sensory (Ant Lat Mall)  32C  12 cm    2.0 <4.5 21.0 >5  Left Sural Anti Sensory (Lat Mall)  32C  Calf    3.5 <4.5 12.1 >5  Right Sural Anti Sensory (Lat Mall)  32C  Calf    3.3 <4.5 13.9 >5   Motor Summary Table   Site NR Onset (ms) Norm Onset (ms) O-P Amp (mV) Norm O-P Amp Site1 Site2 Delta-0 (ms) Dist (cm) Vel (m/s) Norm Vel (m/s)  Left Peroneal Motor (Ext Dig Brev)  32C  Ankle    3.4 <5.5 7.6 >3 B Fib Ankle 7.5 34.0 45 >40  B Fib    10.9  7.2  Poplt B Fib 1.5 8.0 53 >40  Poplt    12.4  7.2         Right Peroneal Motor (Ext Dig Brev)   32C  Ankle    3.8 <5.5 5.9 >3 B Fib Ankle 7.0 36.0 51 >40  B Fib    10.8  5.7  Poplt B Fib 1.9 8.0 42 >40  Poplt    12.7  5.6         Left Tibial Motor (Abd Hall Brev)  32C  Ankle    5.5 <6.0 15.1 >8 Knee Ankle 8.8 38.0 43 >40  Knee    14.3  11.8         Right Tibial Motor (Abd Hall Brev)  32C  Ankle    5.9 <6.0 17.1 >8 Knee Ankle 8.2 39.0 48 >40  Knee    14.1  12.1          H Reflex Studies   NR H-Lat (ms) Lat Norm (ms) L-R H-Lat (ms)  Left Tibial (Gastroc)  32C     32.38 <35 1.36  Right Tibial (Gastroc)  32C     33.74 <35 1.36   EMG   Side Muscle Ins Act Fibs Psw Fasc Number Recrt Dur Dur. Amp Amp. Poly Poly. Comment  Right AntTibialis Nml Nml Nml Nml Nml Nml Nml Nml Nml Nml Nml Nml N/A  Right Gastroc Nml Nml Nml Nml Nml Nml Nml Nml Nml Nml  Nml Nml N/A  Right Flex Dig Long Nml Nml Nml Nml Nml Nml Nml Nml Nml Nml Nml Nml N/A  Right RectFemoris Nml Nml Nml Nml Nml Nml Nml Nml Nml Nml Nml Nml N/A  Right GluteusMed Nml Nml Nml Nml Nml Nml Nml Nml Nml Nml Nml Nml N/A  Left AntTibialis Nml Nml Nml Nml Nml Nml Nml Nml Nml Nml Nml Nml N/A  Left Gastroc Nml Nml Nml Nml Nml Nml Nml Nml Nml Nml Nml Nml N/A  Left Flex Dig Long Nml Nml Nml Nml Nml Nml Nml Nml Nml Nml Nml Nml N/A  Left RectFemoris Nml Nml Nml Nml Nml Nml Nml Nml Nml Nml Nml Nml N/A  Left GluteusMed Nml Nml Nml Nml Nml Nml Nml Nml Nml Nml Nml Nml N/A      Waveforms:

## 2019-01-25 ENCOUNTER — Encounter: Payer: Self-pay | Admitting: Family

## 2019-01-25 ENCOUNTER — Other Ambulatory Visit: Payer: Self-pay

## 2019-01-25 ENCOUNTER — Ambulatory Visit: Payer: PRIVATE HEALTH INSURANCE | Admitting: Family

## 2019-01-25 VITALS — BP 126/79 | HR 59 | Temp 97.1°F | Resp 16 | Ht 66.0 in | Wt 124.0 lb

## 2019-01-25 DIAGNOSIS — M533 Sacrococcygeal disorders, not elsewhere classified: Secondary | ICD-10-CM

## 2019-01-25 DIAGNOSIS — R202 Paresthesia of skin: Secondary | ICD-10-CM | POA: Diagnosis not present

## 2019-01-25 DIAGNOSIS — Z23 Encounter for immunization: Secondary | ICD-10-CM

## 2019-01-25 NOTE — Patient Instructions (Signed)
You should be contacted about your referral to physical therapy. Please do regular stretching/yoga daily Call if symptoms worsen.

## 2019-01-25 NOTE — Progress Notes (Signed)
Subjective:    Patient ID: Debbie [name], female    DOB: [birth year], 33 y.o.   MRN: [id number]  HPI  Patient is a 33 yr old female who presents today to discuss intermittent discomfort in the lower half of her body. We initially discussed this complaint back on 11/09/18. At that visit a B12, folate, and TSH were performed and were all WNL.  We referred her to neurology last visit and she saw Dr. Allena Katz on 11/30/18.  Neurology felt that her symptoms were unlikely to represent anything worrisome such as lumbar radiculopathy, MS or other nervous system pathology.  She subsequently underwent NCV and EMG on 01/10/19.  This study was normal.   Reports that over the summer she had pins/needles in her feet.  Notes some aching in the upper thighs.  Reports that she sleeps ok if she wears compression tights.  Reports that if she sits for long periods of time she develops some pain in her sacrum and into the tops of her thighs/hips.  She describes the discomfort "like a pressure, almost like falling asleep but not quite."  Also notices similar symptoms when she lays on one side.  Symptoms resolve when she changes position.   She has followed with chiropractic.  This has not really helped her symptoms.  Review of Systems See HPI  Past Medical History:  Diagnosis Date  . Abnormal heart rhythm   . History of chicken pox   . POTS (postural orthostatic tachycardia syndrome)      Social History   Socioeconomic History  . Marital status: Single    Spouse name: Not on file  . Number of children: 0  . Years of education: Not on file  . Highest education level: Not on file  Occupational History  . Occupation: university professor  Social Needs  . Financial resource strain: Not on file  . Food insecurity    Worry: Not on file    Inability: Not on file  . Transportation needs    Medical: Not on file    Non-medical: Not on file  Tobacco Use  . Smoking status: Never Smoker  . Smokeless tobacco:  Never Used  Substance and Sexual Activity  . Alcohol use: No  . Drug use: No  . Sexual activity: Yes    Birth control/protection: None  Lifestyle  . Physical activity    Days per week: Not on file    Minutes per session: Not on file  . Stress: Not on file  Relationships  . Social Musician on phone: Not on file    Gets together: Not on file    Attends religious service: Not on file    Active member of club or organization: Not on file    Attends meetings of clubs or organizations: Not on file    Relationship status: Not on file  . Intimate partner violence    Fear of current or ex partner: Not on file    Emotionally abused: Not on file    Physically abused: Not on file    Forced sexual activity: Not on file  Other Topics Concern  . 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 TONSILLECTOMY AND ADENOIDECTOMY  1997  . WISDOM TOOTH EXTRACTION  all 4    Family History  Problem Relation Age of Onset  . Osteoporosis Mother   . Hyperlipidemia Father   . Heart disease Father        CAD/defibrillator age 57  . Hypertension Father   . Diabetes Father        type 2  . Colon cancer Neg Hx   . Esophageal cancer Neg Hx   . Stomach cancer Neg Hx     Allergies  Allergen Reactions  . Gluten Meal     No current outpatient medications on file prior to visit.   No current facility-administered medications on file prior to visit.     BP 126/79 (BP Location: Right Arm, Patient Position: Sitting, Cuff Size: Small)   Pulse (!) 59   Temp (!) 97.1 F (36.2 C) (Temporal)   Resp 16   Ht 5\' 6"  (1.676 m)   Wt 124 lb (56.2 kg)   SpO2 100%   BMI 20.01 kg/m       Objective:   Physical Exam Constitutional:      Appearance: She is well-developed.  Cardiovascular:     Rate and Rhythm: Normal  rate and regular rhythm.     Heart sounds: Normal heart sounds. No murmur.  Pulmonary:     Effort: Pulmonary effort is normal. No respiratory distress.     Breath sounds: Normal breath sounds. No wheezing.  Psychiatric:        Behavior: Behavior normal.        Thought Content: Thought content normal.        Judgment: Judgment normal.           Assessment & Plan:  Sacral pain/Paresthesia- unusual presentation.  Thankfully, her neurology is not concerned about neuro pathology. We discussed alternative therapies such as massage therapy.  She plans to continue chiropractic.  She interested in trying PT once her work schedule calms down for the holidays.    A total of 20  minutes were spent face-to-face with the patient during this encounter and over half of that time was spent on counseling and coordination of care. The patient was counseled on *.

## 2019-03-04 ENCOUNTER — Other Ambulatory Visit: Payer: Self-pay

## 2019-03-05 ENCOUNTER — Encounter: Payer: Self-pay | Admitting: Family

## 2019-03-05 ENCOUNTER — Ambulatory Visit (INDEPENDENT_AMBULATORY_CARE_PROVIDER_SITE_OTHER): Payer: PRIVATE HEALTH INSURANCE | Admitting: Family

## 2019-03-05 ENCOUNTER — Ambulatory Visit (HOSPITAL_BASED_OUTPATIENT_CLINIC_OR_DEPARTMENT_OTHER)
Admission: RE | Admit: 2019-03-05 | Discharge: 2019-03-05 | Disposition: A | Discharge status: Home / Self Care | Payer: PRIVATE HEALTH INSURANCE | Source: Ambulatory Visit | Attending: Family | Admitting: Family

## 2019-03-05 VITALS — BP 132/79 | HR 66 | Temp 96.8°F | Resp 16 | Wt 121.0 lb

## 2019-03-05 DIAGNOSIS — M533 Sacrococcygeal disorders, not elsewhere classified: Secondary | ICD-10-CM | POA: Diagnosis not present

## 2019-03-05 LAB — POCT URINE PREGNANCY: Preg Test, Ur: NEGATIVE

## 2019-03-05 IMAGING — DX DG SACRUM/COCCYX 2+V
3 series · 3 of 3 positions shown · non-contrast
Comparison: None.

CLINICAL DATA: 33-year-old female with sacral pain.

EXAM:
SACRUM AND COCCYX - 2+ VIEW

[coccyx ap]
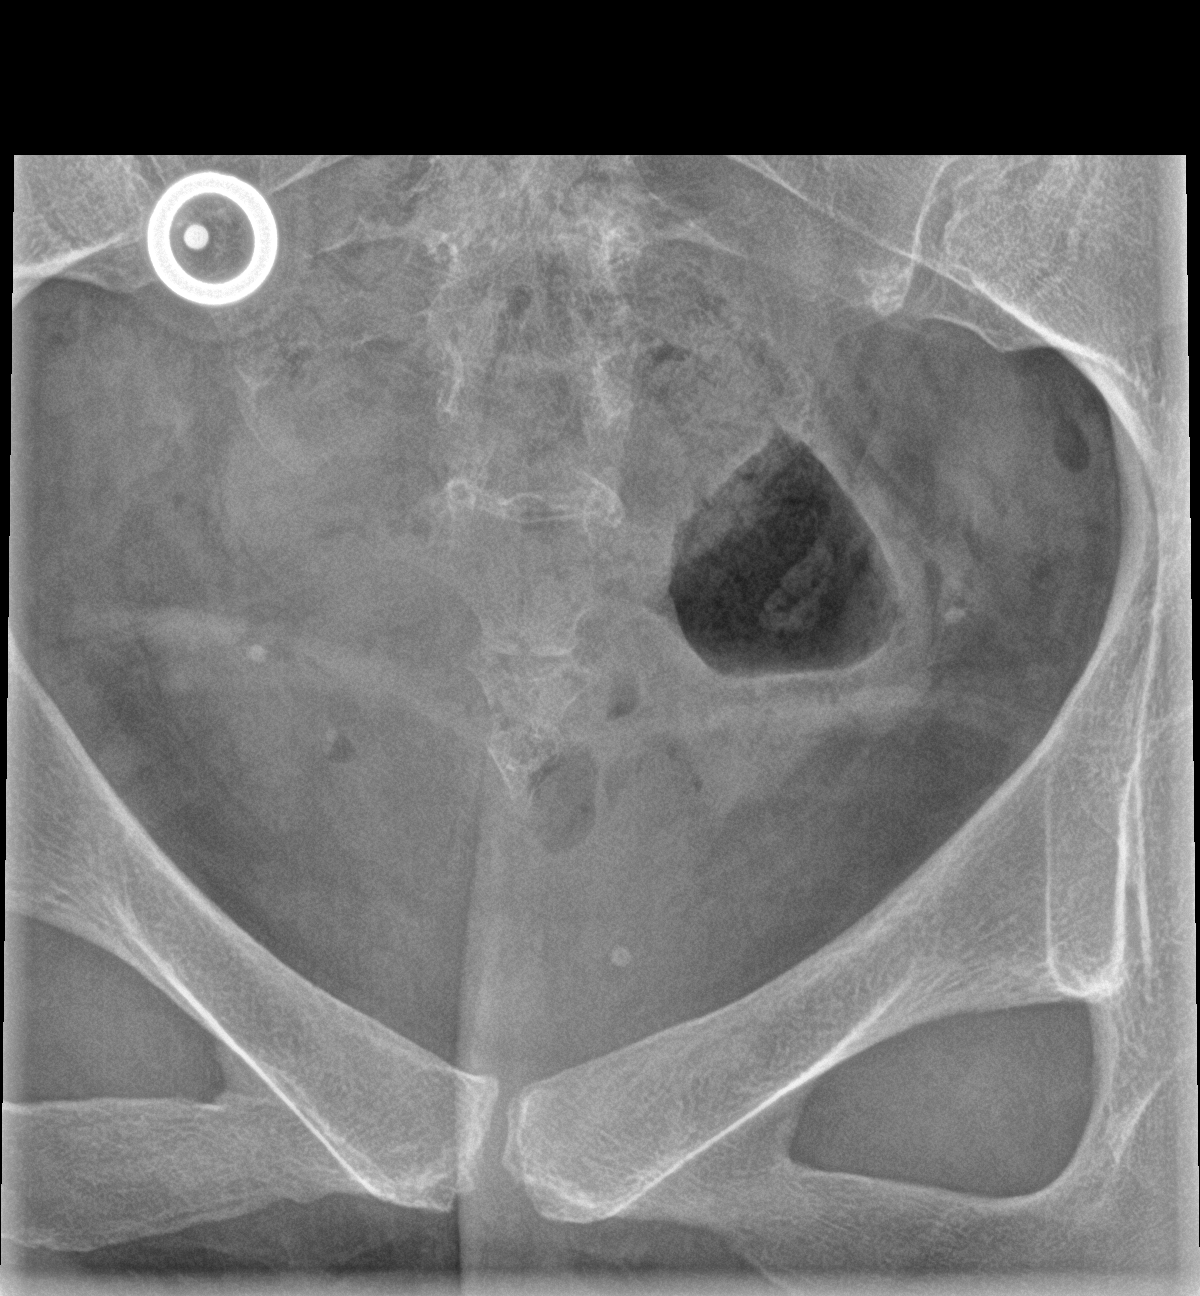

[sacrum ap]
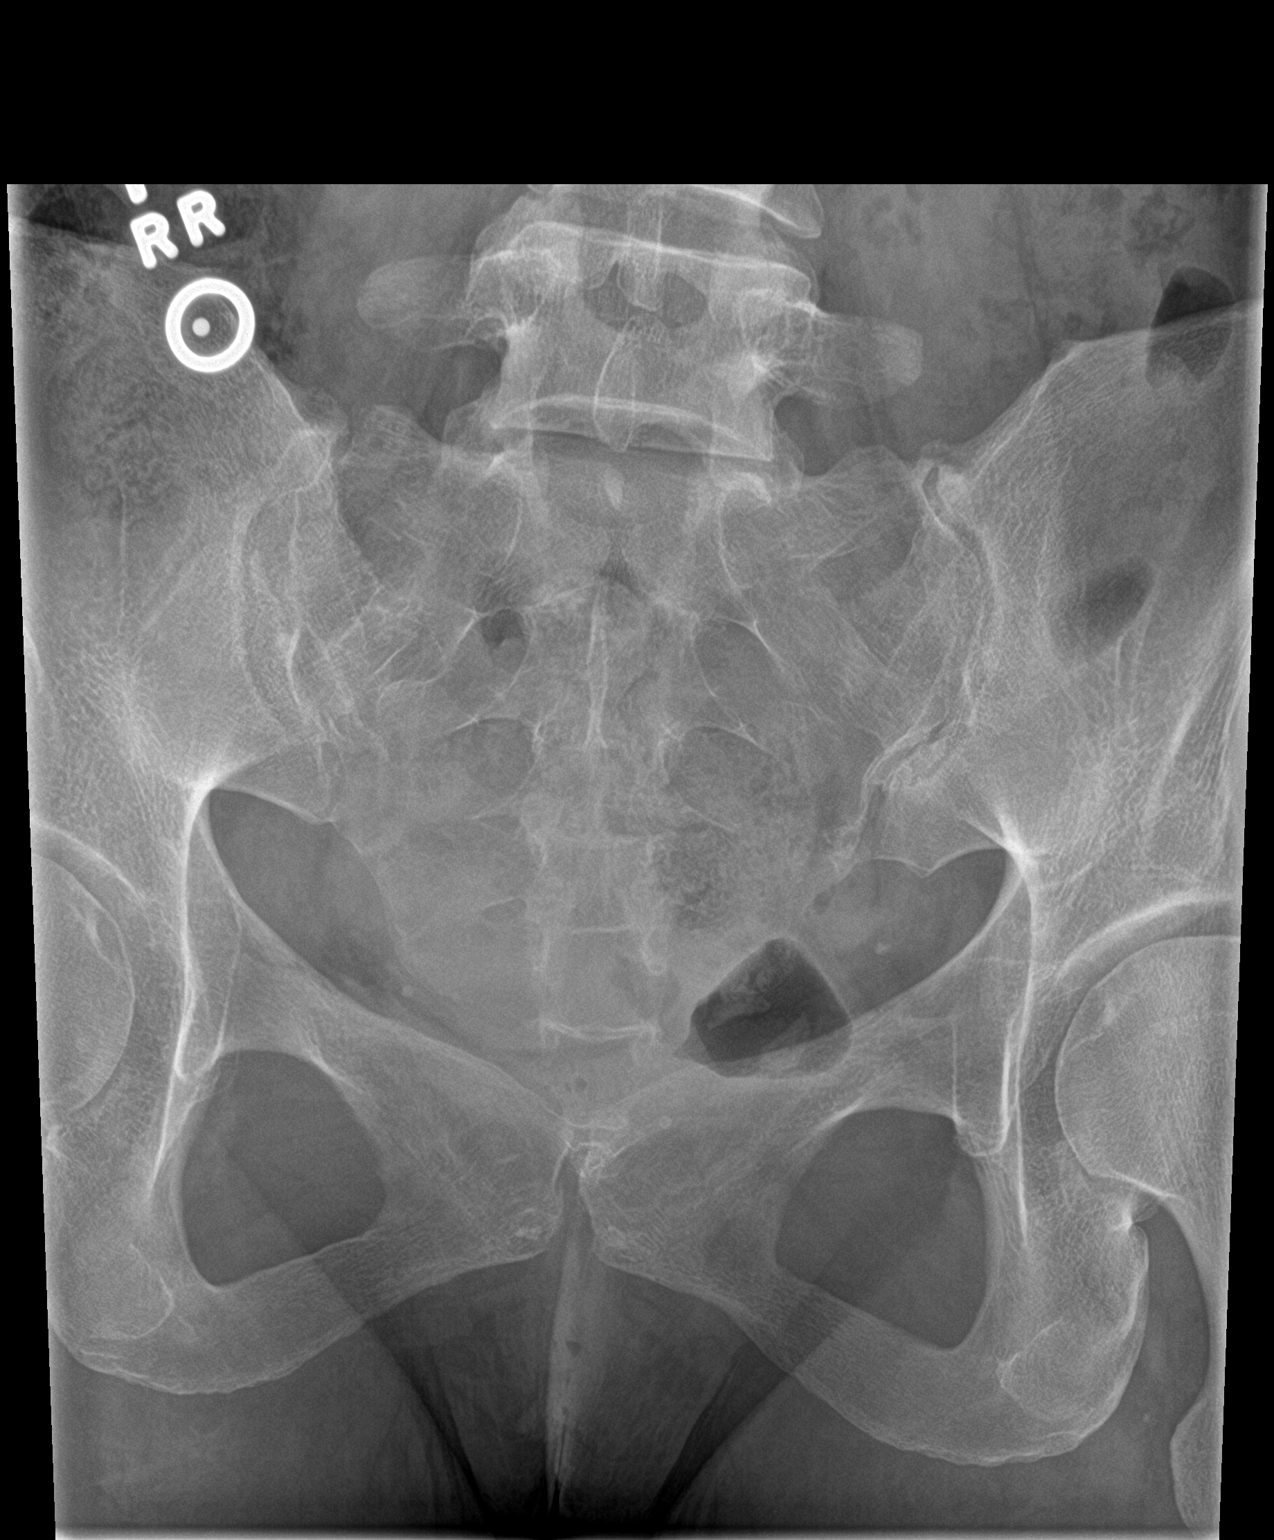

[sacrum lat]
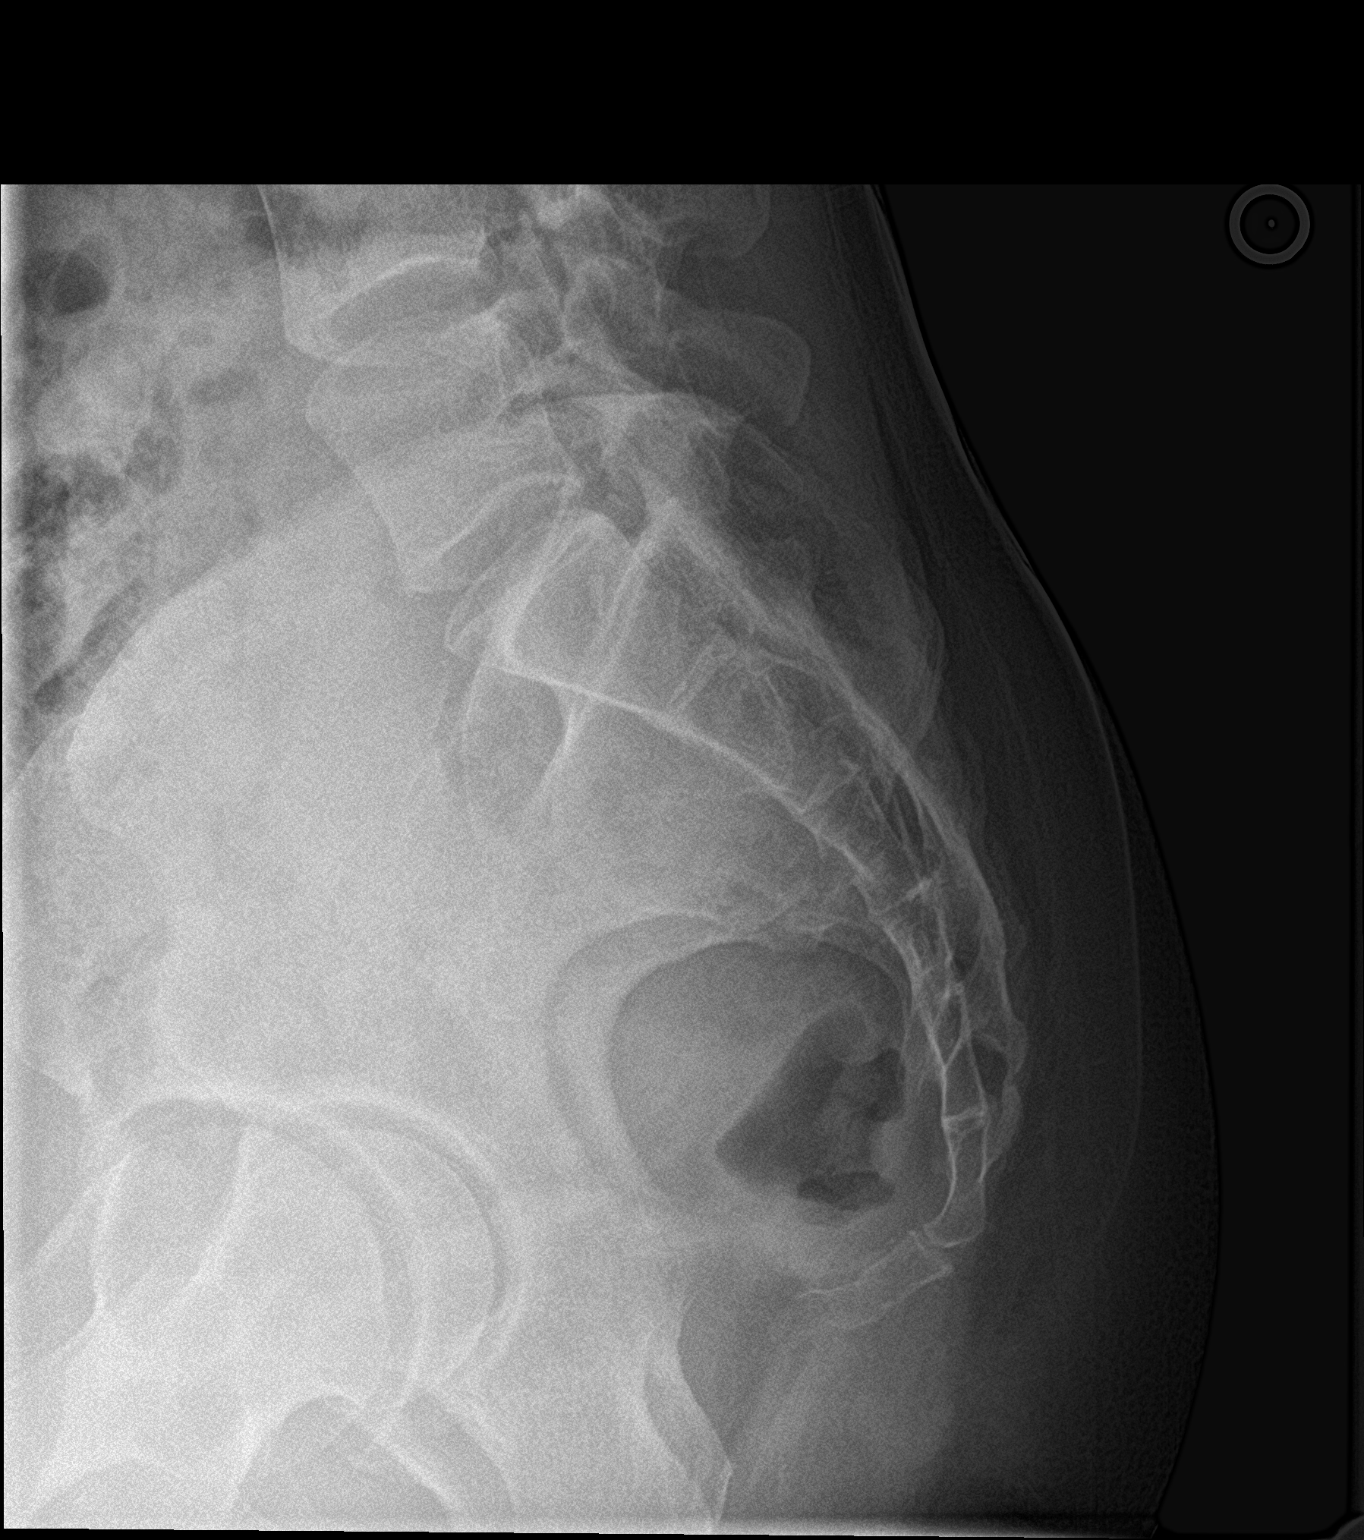

[3 of 3 positions shown; findings below may reference images not displayed]

FINDINGS: There is no evidence of fracture or other focal bone lesions.
IMPRESSION: Negative.

## 2019-03-05 MED ORDER — MELOXICAM 15 MG PO TABS: 15.0000 mg | ORAL_TABLET | Freq: Every day | ORAL | 0 refills | Status: DC

## 2019-03-05 NOTE — Patient Instructions (Signed)
Please complete xray on the first floor. Begin meloxicam (anti-inflammatory). Consider a gel pad or donut pillow for your work chair. You should be contacted about your referral to Sports medicine.

## 2019-03-05 NOTE — Progress Notes (Signed)
Subjective:    Patient ID: Debbie [name], female    DOB: Dec 21, 1985, 33 y.o.   MRN: [id number]  HPI  Sacral pain has been intermittent for the last several weeks. Worse with sitting. "feels like there is pain in the bones and around my hips."  Reports that she starts PT on Friday.  She is seeing chiropractic regularly.  Sometimes she thinks that the adjustments help some. Will follow up with him. She is able to walk and run for 20 minutes without difficulty. She has tried a few doses of ibuprofen without significant improvement.     Review of Systems See HPI  Past Medical History:  Diagnosis Date  . Abnormal heart rhythm   . History of chicken pox   . POTS (postural orthostatic tachycardia syndrome)      Social History   Socioeconomic History  . Marital status: Single    Spouse name: Not on file  . Number of children: 0  . Years of education: Not on file  . Highest education level: Not on file  Occupational History  . Occupation: university professor  Social Needs  . Financial resource strain: Not on file  . Food insecurity    Worry: Not on file    Inability: Not on file  . Transportation needs    Medical: Not on file    Non-medical: Not on file  Tobacco Use  . Smoking status: Never Smoker  . Smokeless tobacco: Never Used  Substance and Sexual Activity  . Alcohol use: No  . Drug use: No  . Sexual activity: Yes    Birth control/protection: None  Lifestyle  . Physical activity    Days per week: Not on file    Minutes per session: Not on file  . Stress: Not on file  Relationships  . Social Herbalist on phone: Not on file    Gets together: Not on file    Attends religious service: Not on file    Active member of club or organization: Not on file    Attends meetings of clubs or organizations: Not on file    Relationship status: Not on file  . Intimate partner violence    Fear of current or ex partner: Not on file    Emotionally abused: Not  on file    Physically abused: Not on file    Forced sexual activity: Not on file  Other Topics Concern  . 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 TONSILLECTOMY AND ADENOIDECTOMY  1997  . WISDOM TOOTH EXTRACTION     all 4    Family History  Problem Relation Age of Onset  . Osteoporosis Mother   . Hyperlipidemia Father   . Heart disease Father        CAD/defibrillator age 23  . Hypertension Father   . Diabetes Father        type 2  . Colon cancer Neg Hx   . Esophageal cancer Neg Hx   . Stomach cancer Neg Hx     Allergies  Allergen Reactions  . Gluten Meal     No current outpatient medications on file prior to visit.   No current facility-administered medications on file prior to visit.     BP 132/79 (BP  Location: Right Arm, Patient Position: Sitting, Cuff Size: Small)   Pulse 66   Temp (!) 96.8 F (36 C) (Temporal)   Resp 16   Wt 121 lb (54.9 kg)   SpO2 100%   BMI 19.53 kg/m       Objective:   Physical Exam Constitutional:      Appearance: She is well-developed.  Neck:     Musculoskeletal: Neck supple.     Thyroid: No thyromegaly.  Cardiovascular:     Rate and Rhythm: Normal rate and regular rhythm.     Heart sounds: Normal heart sounds. No murmur.  Pulmonary:     Effort: Pulmonary effort is normal. No respiratory distress.     Breath sounds: Normal breath sounds. No wheezing.  Musculoskeletal:     Cervical back: She exhibits no tenderness.     Thoracic back: She exhibits no tenderness.     Lumbar back: She exhibits tenderness.  Skin:    General: Skin is warm and dry.  Neurological:     Mental Status: She is alert and oriented to person, place, and time.  Psychiatric:        Behavior: Behavior normal.        Thought Content: Thought content normal.        Judgment: Judgment  normal.           Assessment & Plan:  Sacral pain- etiology unclear. Will rx with meloxicam, obtain sacral xray, refer to sports medicine. She is instructed to proceed with PT.  Purchase a gel pad or donut pillow for her work chair.

## 2019-03-06 ENCOUNTER — Encounter: Payer: Self-pay | Admitting: Family

## 2019-03-08 ENCOUNTER — Other Ambulatory Visit: Payer: Self-pay

## 2019-03-08 ENCOUNTER — Encounter: Payer: Self-pay | Admitting: Physical Therapy

## 2019-03-08 ENCOUNTER — Ambulatory Visit: Payer: PRIVATE HEALTH INSURANCE | Attending: Family | Admitting: Physical Therapy

## 2019-03-08 DIAGNOSIS — M533 Sacrococcygeal disorders, not elsewhere classified: Secondary | ICD-10-CM | POA: Insufficient documentation

## 2019-03-08 DIAGNOSIS — R29898 Other symptoms and signs involving the musculoskeletal system: Secondary | ICD-10-CM | POA: Diagnosis present

## 2019-03-08 DIAGNOSIS — M6281 Muscle weakness (generalized): Secondary | ICD-10-CM | POA: Insufficient documentation

## 2019-03-08 NOTE — Therapy (Signed)
[facility] [address]  [place], [place], [address] Phone: [phone]   Fax:  [phone]  Physical Therapy Evaluation  Patient Details  Name: Debbie [name] MRN: [id number] Date of Birth: 12-19-1985 Referring Provider (PT): [name], NP   Encounter Date: 03/08/2019  PT End of Session - 03/08/19 1224    Visit Number  1    Number of Visits  13    Date for PT Re-Evaluation  04/19/19    Authorization Type  [employer]    PT Start Time  1018    PT Stop Time  1056    PT Time Calculation (min)  38 min    Activity Tolerance  Patient tolerated treatment well    Behavior During Therapy  [facility] for tasks assessed/performed       Past Medical History:  Diagnosis Date  . Abnormal heart rhythm   . History of chicken pox   . POTS (postural orthostatic tachycardia syndrome)     Past Surgical History:  Procedure Laterality Date  . TONSILLECTOMY AND ADENOIDECTOMY  1997  . WISDOM TOOTH EXTRACTION     all 4    There were no vitals filed for this visit.   Subjective Assessment - 03/08/19 1020    Subjective  Patient reports that in April 2020 she transitioned to working from home d/t the quarantine. 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 Noticed sacral pain and N/T after sitting for prolonged periods and was prescribed a steroid pack which helped some. Referred to neurology who cleared her for nerve impingement and referred to PT. Since her students have recently gone back home, she had had to increase her sitting time again, causing resurgence in pain and N/T. N/T worse with prolonged sitting or laying down and at night time. Better when wearing compression tights. N/T location is variable depending on the area she is laying on- rarely occurs down to feet. Pain occurs over central sacrum with radiation over B buttock. Worse with prolonged sitting and  laying. Still able to run for 20 min 3 x/week without pain.    Pertinent History  hx of postural orthostatic tachycardia syndrome, abnormal heart rhythm    Limitations  Sitting;Lifting;Standing;Walking;House hold activities    How long can you sit comfortably?  variable couple minutes-hours    How long can you stand comfortably?  unlimited    How long can you walk comfortably?  unlimited    Diagnostic tests  03/05/19 sacrum/coccyx xray: negative    Patient Stated Goals  be able to lay down and sit without pain or tingling    Currently in Pain?  Yes    Pain Score  3     Pain Location  Sacrum    Pain Orientation  Lower    Pain Descriptors / Indicators  Dull    Pain Type  Chronic pain         [facility] PT Assessment - 03/08/19 1028      Assessment   Medical Diagnosis  Sacral pain    Referring Provider (PT)  [name], NP    Onset Date/Surgical Date  07/04/18    Next MD Visit  04/25/18    Prior Therapy  yes- several years ago      Precautions   Precautions  --   hx of postural orthostatic tachycardia syndrome     Balance Screen   Has the patient  fallen in the past 6 months  No    Has the patient had a decrease in activity level because of a fear of falling?   No    Is the patient reluctant to leave their home because of a fear of falling?   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[employer]    Leisure  none      Cognition   Overall Cognitive Status  Within Functional Limits for tasks assessed      Sensation   Light Touch  Appears Intact      Coordination   Gross Motor Movements are Fluid and Coordinated  Yes      Posture/Postural Control    Posture/Postural Control  Postural limitations    Postural Limitations  Rounded Shoulders;Posterior pelvic tilt      ROM / Strength   AROM / PROM / Strength  AROM;Strength      AROM   AROM Assessment Site  Hip    Right/Left Hip  Right;Left    Right Hip External Rotation   41    Right Hip Internal Rotation   19   "catch"   Left Hip External Rotation   35    Left Hip Internal Rotation   26      Strength   Strength Assessment Site  Hip;Knee;Ankle    Right/Left Hip  Right;Left    Right Hip Flexion  4+/5    Right Hip Extension  4-/5    Right Hip External Rotation   4/5    Right Hip Internal Rotation  4/5    Right Hip ABduction  4+/5    Right Hip ADduction  4/5    Left Hip Flexion  4+/5    Left Hip Extension  4/5    Left Hip External Rotation  4/5    Left Hip Internal Rotation  4+/5    Left Hip ABduction  4+/5    Left Hip ADduction  4+/5    Right/Left Knee  Right;Left    Right Knee Flexion  4/5    Right Knee Extension  4+/5    Left Knee Flexion  4+/5    Left Knee Extension  4+/5    Right/Left Ankle  Right;Left    Right Ankle Dorsiflexion  4+/5    Right Ankle Plantar Flexion  4+/5    Left Ankle Dorsiflexion  4+/5    Left Ankle Plantar Flexion  4+/5      Flexibility   Soft Tissue Assessment /Muscle Length  yes    Hamstrings  B WFL    Quadriceps  R hip flexor mod tight    Piriformis  B WFL      Palpation   SI assessment   L PSIS elevated, R ASIS elevated- L innominate rotated anteriorly or R innominate rotated posteriorly    reported that she used to wear a 64mm heel lift   Palpation comment  TTP over L3 spinous process, B PSIS, proximal and lateral glutes, and piriformis       Ambulation/Gait  Assistive device  None    Gait Pattern  Step-through pattern;Within Functional Limits    Ambulation Surface  Level;Indoor    Gait velocity  WNL                Objective measurements completed on examination: See above findings.              PT Education  - 03/08/19 1224    Education Details  prognosis, POC, HEP    Person(s) Educated  Patient    Methods  Explanation;Demonstration;Tactile cues;Verbal cues;Handout    Comprehension  Verbalized understanding;Returned demonstration       PT Short Term Goals - 03/08/19 1233      PT SHORT TERM GOAL #1   Title  Patient to be independent with initial HEP.    Time  3    Period  Weeks    Status  New    Target Date  03/29/19        PT Long Term Goals - 03/08/19 1233      PT LONG TERM GOAL #1   Title  Patient to be independent with advanced HEP.    Time  6    Period  Weeks    Status  New    Target Date  04/19/19      PT LONG TERM GOAL #2   Title  Patient to demonstrate B hip strength >/=4+/5.    Time  6    Period  Weeks    Status  New    Target Date  04/19/19      PT LONG TERM GOAL #3   Title  Patient to report no tenderness and 50% improvement in sof tissue restriction with palpation over B posterior chain musculature.    Time  6    Period  Weeks    Status  New    Target Date  04/19/19      PT LONG TERM GOAL #4   Title  Patient to report tolerance of working from home for 1 week with average pain and N/T levels <3/10.    Time  6    Period  Weeks    Status  New    Target Date  04/19/19             Plan - 03/08/19 1225    Clinical Impression Statement  Patient is a 33y/o F presenting to OPPT with c/o sacral pain and N/T since starting to work from home in April 2020. Pain is located centrally over sacrum with variable N/T down legs and buttock- rarely down to feet. Worse with prolonged sitting or laying in any position, with N/T occurring over areas receiving direct pressure. Was cleared by neurology for nerve impingement/involvement. Patient today demonstrated decreased hip ROM, decreased hip strength- worse on R LE, sacral malalignment, R hip flexor tightness, and TTP over B posterior chain musculature. Educated patient on gentle stretching and strengthening HEP- patient  reported understanding. Would benefit from skilled PT services 2x/week for 6 weeks to address aforementioned impairments.    Personal Factors and Comorbidities  Age;Sex;Comorbidity 2;Time since onset of injury/illness/exacerbation;Past/Current Experience;Profession    Comorbidities  hx postural orthostatic tachycardia syndrome, abnormal heart rhythm    Examination-Activity Limitations  Sit;Sleep;Bed Mobility    Examination-Participation Restrictions  Church;Driving    Stability/Clinical Decision Making  Evolving/Moderate complexity    Clinical Decision Making  Moderate    Rehab Potential  Good    PT Frequency  2x / week  PT Duration  6 weeks    PT Treatment/Interventions  ADLs/Self Care Home Management;Cryotherapy;Electrical Stimulation;Iontophoresis /ml Dexamethasone;Moist Heat;Balance training;Therapeutic exercise;Therapeutic activities;Functional mobility training;Stair training;Gait training;Ultrasound;Neuromuscular re-education;Patient/family education;Manual techniques;Taping;Energy conservation;Dry needling;Passive range of motion    PT Next Visit Plan  reassess HEP; teach ball on wall to glutes/piri, FOTO; assess lumbar AROM    Consulted and Agree with Plan of Care  Patient       Patient will benefit from skilled therapeutic intervention in order to improve the following deficits and impairments:  Decreased activity tolerance, Decreased strength, Increased fascial restricitons, Pain, Improper body mechanics, Decreased range of motion, Impaired flexibility, Postural dysfunction  Visit Diagnosis: Sacral pain  Muscle weakness (generalized)  Other symptoms and signs involving the musculoskeletal system     Problem List Patient Active Problem List   Diagnosis Date Noted  . Atypical squamous cells of undetermined significance (ASCUS) on Papanicolaou smear of cervix 01/17/2014  . Routine general medical examination at a health care facility 01/28/2013  . POTS (postural  orthostatic tachycardia syndrome) 01/28/2013     Debbie [name], PT, DPT 03/08/19 12:40 PM   [facility] [address]  [address] [place], [place], [address] Phone: [phone]   Fax:  [phone]  Name: Debbie [name] MRN: [id number] Date of Birth: 01-05-1986

## 2019-03-08 NOTE — Therapy (Deleted)
[facility] [address]  [address] [place], [place], [address] Phone: [phone]   Fax:  [phone]  Physical Therapy Treatment  Patient Details  Name: Debbie [name] MRN: [id number] Date of Birth: 1986/03/15 Referring Provider (PT): Sandford Craze, NP   Encounter Date: 03/08/2019  PT End of Session - 03/08/19 1224    Visit Number  1    Number of Visits  13    Date for PT Re-Evaluation  04/19/19    Authorization Type  [employer]    PT Start Time  1018    PT Stop Time  1056    PT Time Calculation (min)  38 min    Activity Tolerance  Patient tolerated treatment well    Behavior During Therapy  [facility] for tasks assessed/performed       Past Medical History:  Diagnosis Date  . Abnormal heart rhythm   . History of chicken pox   . POTS (postural orthostatic tachycardia syndrome)     Past Surgical History:  Procedure Laterality Date  . TONSILLECTOMY AND ADENOIDECTOMY  1997  . WISDOM TOOTH EXTRACTION     all 4    There were no vitals filed for this visit.  Subjective Assessment - 03/08/19 1020    Subjective  Patient reports that in April 2020 she transitioned to working from home d/t the quarantine. 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 Noticed sacral pain and N/T after sitting for prolonged periods and was prescribed a steroid pack which helped some. Referred to neurology who cleared her for nerve impingement and referred to PT. Since her students have recently gone back home, she had had to increase her sitting time again, causing resurgence in pain and N/T. N/T worse with prolonged sitting or laying down and at night time. Better when wearing compression tights. N/T location is variable depending on the area she is laying on- rarely occurs down to feet. Pain occurs over central sacrum with radiation over B buttock. Worse with prolonged sitting and  laying. Still able to run for 20 min 3 x/week without pain.    Pertinent History  hx of postural orthostatic tachycardia syndrome, abnormal heart rhythm    Limitations  Sitting;Lifting;Standing;Walking;House hold activities    How long can you sit comfortably?  variable couple minutes-hours    How long can you stand comfortably?  unlimited    How long can you walk comfortably?  unlimited    Diagnostic tests  03/05/19 sacrum/coccyx xray: negative    Patient Stated Goals  be able to lay down and sit without pain or tingling    Currently in Pain?  Yes    Pain Score  3     Pain Location  Sacrum    Pain Orientation  Lower    Pain Descriptors / Indicators  Dull    Pain Type  Chronic pain         [facility] PT Assessment - 03/08/19 1028      Assessment   Medical Diagnosis  Sacral pain    Referring Provider (PT)  Sandford Craze, NP    Onset Date/Surgical Date  07/04/18    Next MD Visit  04/25/18    Prior Therapy  yes- several years ago      Precautions   Precautions  --   hx of postural orthostatic tachycardia syndrome     Balance Screen   Has the patient fallen  in the past 6 months  No    Has the patient had a decrease in activity level because of a fear of falling?   No    Is the patient reluctant to leave their home because of a fear of falling?   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[employer]    Leisure  none      Cognition   Overall Cognitive Status  Within Functional Limits for tasks assessed      Sensation   Light Touch  Appears Intact      Coordination   Gross Motor Movements are Fluid and Coordinated  Yes      Posture/Postural Control    Posture/Postural Control  Postural limitations    Postural Limitations  Rounded Shoulders;Posterior pelvic tilt      ROM / Strength   AROM / PROM / Strength  AROM;Strength      AROM   AROM Assessment Site  Hip    Right/Left Hip  Right;Left    Right Hip External Rotation   41    Right Hip Internal Rotation   19   "catch"   Left Hip External Rotation   35    Left Hip Internal Rotation   26      Strength   Strength Assessment Site  Hip;Knee;Ankle    Right/Left Hip  Right;Left    Right Hip Flexion  4+/5    Right Hip Extension  4-/5    Right Hip External Rotation   4/5    Right Hip Internal Rotation  4/5    Right Hip ABduction  4+/5    Right Hip ADduction  4/5    Left Hip Flexion  4+/5    Left Hip Extension  4/5    Left Hip External Rotation  4/5    Left Hip Internal Rotation  4+/5    Left Hip ABduction  4+/5    Left Hip ADduction  4+/5    Right/Left Knee  Right;Left    Right Knee Flexion  4/5    Right Knee Extension  4+/5    Left Knee Flexion  4+/5    Left Knee Extension  4+/5    Right/Left Ankle  Right;Left    Right Ankle Dorsiflexion  4+/5    Right Ankle Plantar Flexion  4+/5    Left Ankle Dorsiflexion  4+/5    Left Ankle Plantar Flexion  4+/5      Flexibility   Soft Tissue Assessment /Muscle Length  yes    Hamstrings  B WFL    Quadriceps  R hip flexor mod tight    Piriformis  B WFL      Palpation   SI assessment   L PSIS elevated, R ASIS elevated- L innominate rotated anteriorly or R innominate rotated posteriorly    reported that she used to wear a 3mm heel lift   Palpation comment  TTP over L3 spinous process, B PSIS, proximal and lateral glutes, and piriformis       Ambulation/Gait  Assistive device  None    Gait Pattern  Step-through pattern;Within Functional Limits    Ambulation Surface  Level;Indoor    Gait velocity  WNL                           PT Education - 03/08/19 1224    Education Details  prognosis, POC, HEP    Person(s)  Educated  Patient    Methods  Explanation;Demonstration;Tactile cues;Verbal cues;Handout    Comprehension  Verbalized understanding;Returned demonstration       PT Short Term Goals - 03/08/19 1233      PT SHORT TERM GOAL #1   Title  Patient to be independent with initial HEP.    Time  3    Period  Weeks    Status  New    Target Date  03/29/19        PT Long Term Goals - 03/08/19 1233      PT LONG TERM GOAL #1   Title  Patient to be independent with advanced HEP.    Time  6    Period  Weeks    Status  New    Target Date  04/19/19      PT LONG TERM GOAL #2   Title  Patient to demonstrate B hip strength >/=4+/5.    Time  6    Period  Weeks    Status  New    Target Date  04/19/19      PT LONG TERM GOAL #3   Title  Patient to report no tenderness and 50% improvement in sof tissue restriction with palpation over B posterior chain musculature.    Time  6    Period  Weeks    Status  New    Target Date  04/19/19      PT LONG TERM GOAL #4   Title  Patient to report tolerance of working from home for 1 week with average pain and N/T levels <3/10.    Time  6    Period  Weeks    Status  New    Target Date  04/19/19            Plan - 03/08/19 1225    Clinical Impression Statement  Patient is a 33y/o F presenting to OPPT with c/o sacral pain and N/T since starting to work from home in April 2020. Pain is located centrally over sacrum with variable N/T down legs and buttock- rarely down to feet. Worse with prolonged sitting or laying in any position, with N/T occurring over areas receiving direct pressure. Was cleared by neurology for nerve impingement/involvement. Patient today demonstrated decreased hip ROM, decreased hip strength- worse on R LE, sacral malalignment, R hip flexor tightness, and TTP over B posterior chain musculature. Educated patient on gentle stretching and strengthening HEP- patient reported understanding. Would benefit from skilled PT services 2x/week for  6 weeks to address aforementioned impairments.    Personal Factors and Comorbidities  Age;Sex;Comorbidity 2;Time since onset of injury/illness/exacerbation;Past/Current Experience;Profession    Comorbidities  hx postural orthostatic tachycardia syndrome, abnormal heart rhythm    Examination-Activity Limitations  Sit;Sleep;Bed Mobility    Examination-Participation Restrictions  Church;Driving    Stability/Clinical Decision Making  Evolving/Moderate complexity    Clinical Decision Making  Moderate    Rehab Potential  Good    PT Frequency  2x / week    PT Duration  6 weeks    PT Treatment/Interventions  ADLs/Self Care Home Management;Cryotherapy;Electrical Stimulation;Iontophoresis 4mg /ml Dexamethasone;Moist Heat;Balance training;Therapeutic exercise;Therapeutic activities;Functional mobility training;Stair training;Gait training;Ultrasound;Neuromuscular re-education;Patient/family education;Manual techniques;Taping;Energy conservation;Dry needling;Passive range of motion    PT Next Visit Plan  reassess HEP; teach ball on wall to glutes/piri, FOTO; assess lumbar AROM    Consulted and Agree with Plan of Care  Patient       Patient will benefit from skilled therapeutic intervention in order to improve the following deficits and impairments:  Decreased activity tolerance, Decreased strength, Increased fascial restricitons, Pain, Improper body mechanics, Decreased range of motion, Impaired flexibility, Postural dysfunction  Visit Diagnosis: Sacral pain  Muscle weakness (generalized)  Other symptoms and signs involving the musculoskeletal system     Problem List Patient Active Problem List   Diagnosis Date Noted  . Atypical squamous cells of undetermined significance (ASCUS) on Papanicolaou smear of cervix 01/17/2014  . Routine general medical examination at a health care facility 01/28/2013  . POTS (postural orthostatic tachycardia syndrome) 01/28/2013    Manuela Neptune 03/08/2019,  12:38 PM  [facility] [address]  [place], [place], [address] Phone: [phone]   Fax:  [phone]  Name: Debbie [name] MRN: [id number] Date of Birth: 07-22-1985

## 2019-03-12 ENCOUNTER — Encounter: Payer: Self-pay | Admitting: Physical Therapy

## 2019-03-12 ENCOUNTER — Other Ambulatory Visit: Payer: Self-pay

## 2019-03-12 ENCOUNTER — Ambulatory Visit: Payer: PRIVATE HEALTH INSURANCE | Admitting: Physical Therapy

## 2019-03-12 DIAGNOSIS — R29898 Other symptoms and signs involving the musculoskeletal system: Secondary | ICD-10-CM

## 2019-03-12 DIAGNOSIS — M533 Sacrococcygeal disorders, not elsewhere classified: Secondary | ICD-10-CM | POA: Diagnosis not present

## 2019-03-12 DIAGNOSIS — M6281 Muscle weakness (generalized): Secondary | ICD-10-CM

## 2019-03-12 NOTE — Therapy (Addendum)
[facility] [address]  [place], [place], [address] Phone: [phone]   Fax:  [phone]  Physical Therapy Treatment  Patient Details  Name: Debbie [name] MRN: [id number] Date of Birth: 1985/07/19 Referring Provider (PT): [name], NP   Encounter Date: 03/12/2019  PT End of Session - 03/12/19 1657    Visit Number  2    Number of Visits  13    Date for PT Re-Evaluation  04/19/19    Authorization Type  [employer]    PT Start Time  1451    PT Stop Time  1534    PT Time Calculation (min)  43 min    Activity Tolerance  Patient tolerated treatment well;Patient limited by pain    Behavior During Therapy  [facility] for tasks assessed/performed       Past Medical History:  Diagnosis Date  . Abnormal heart rhythm   . History of chicken pox   . POTS (postural orthostatic tachycardia syndrome)     Past Surgical History:  Procedure Laterality Date  . TONSILLECTOMY AND ADENOIDECTOMY  1997  . WISDOM TOOTH EXTRACTION     all 4    There were no vitals filed for this visit.  Subjective Assessment - 03/12/19 1452    Subjective  Had a hard time with the exercises- one of them hurt her R knee so she was unable to run. Did not know if she was doing them correctly.    Pertinent History  hx of postural orthostatic tachycardia syndrome, abnormal heart rhythm    Diagnostic tests  03/05/19 sacrum/coccyx xray: negative    Patient Stated Goals  be able to lay down and sit without pain or tingling    Currently in Pain?  Yes    Multiple Pain Sites  Yes    Pain Score  3    Pain Location  Neck    Pain Orientation  Right;Medial    Pain Descriptors / Indicators  Aching;Dull    Pain Type  Acute pain         [facility] PT Assessment - 03/12/19 0001      AROM   AROM Assessment Site  Lumbar    Lumbar Flexion  toes    Lumbar Extension  WNL    Lumbar - Right Side Bend  distal thigh   pain in R QL   Lumbar - Left Side  Bend  distal thigh    Lumbar - Right Rotation  mildly limited    Lumbar - Left Rotation  mildly limited                   [facility] Adult PT Treatment/Exercise - 03/12/19 0001      Exercises   Exercises  Lumbar;Knee/Hip      Lumbar Exercises: Supine   Bridge with clamshell  10 reps   cues to lift hips further     Knee/Hip Exercises: Stretches   Sports administrator  Right;1 rep;30 seconds    Quad Stretch Limitations  with strap and ball    unable d/t knee pain   Other Knee/Hip Stretches  R/L KTOS 30" each   unable to perform d/t groin pain     Knee/Hip Exercises: Aerobic   Recumbent Bike  L1 x 6 min      Knee/Hip Exercises: Seated   Other Seated Knee/Hip Exercises  sitting clamshell with red TB x10    Other Seated Knee/Hip Exercises  sitting IR with ball and  yellow TB x10      Knee/Hip Exercises: Sidelying   Clams  x10 each side   good form     Knee/Hip Exercises: Prone   Hip Extension  Strengthening;Right;Left;1 set;10 reps    Hip Extension Limitations  prone donkey kicks   good form; report of straining in opposite buttock            PT Education - 03/12/19 1656    Education Details  update to HEP- omitted stretches from last HEP; advised patient not to push into pain with exercises and discontinue if worsening of pain occurs; advised patient to contact me if issues arise    Person(s) Educated  Patient    Methods  Explanation;Demonstration;Tactile cues;Verbal cues;Handout    Comprehension  Verbalized understanding;Returned demonstration       PT Short Term Goals - 03/12/19 1702      PT SHORT TERM GOAL #1   Title  Patient to be independent with initial HEP.    Time  3    Period  Weeks    Status  On-going    Target Date  03/29/19        PT Long Term Goals - 03/12/19 1702      PT LONG TERM GOAL #1   Title  Patient to be independent with advanced HEP.    Time  6    Period  Weeks    Status  On-going      PT LONG TERM GOAL #2   Title  Patient to  demonstrate B hip strength >/=4+/5.    Time  6    Period  Weeks    Status  On-going      PT LONG TERM GOAL #3   Title  Patient to report no tenderness and 50% improvement in sof tissue restriction with palpation over B posterior chain musculature.    Time  6    Period  Weeks    Status  On-going      PT LONG TERM GOAL #4   Title  Patient to report tolerance of working from home for 1 week with average pain and N/T levels <3/10.    Time  6    Period  Weeks    Status  On-going            Plan - 03/12/19 1657    Clinical Impression Statement  Patient reporting onset of R medial knee pain since last session d/t one of her HEP exercises. Reports that she is not sure if she was doing them correctly. Reviewed HEP for improved patient understanding. Patient apprehensive to perform hip flexor stretch as she believes this is the stretch that caused her pain, thus tried prone quad stretch. Patient also reported knee pain with this stretch as well as groin pain with KTOS. Thus, advised patient to omit all stretches from previous HEP handout for the time being. Patient reported understanding. Worked on reviewing other HEP exercises- patient overall performing these exercises well. Imitated additional hip strengthening exercises- patient unsure if sitting clamshell increased pain, thus updated HEP with other hip rotation exercises that patient did tolerate well. Patient reported understanding. Reported continued knee pain at end of session but declined modalities.    Comorbidities  hx postural orthostatic tachycardia syndrome, abnormal heart rhythm    PT Treatment/Interventions  ADLs/Self Care Home Management;Cryotherapy;Electrical Stimulation;Iontophoresis 51m/ml Dexamethasone;Moist Heat;Balance training;Therapeutic exercise;Therapeutic activities;Functional mobility training;Stair training;Gait training;Ultrasound;Neuromuscular re-education;Patient/family education;Manual techniques;Taping;Energy  conservation;Dry needling;Passive range of motion    PT Next Visit  Plan  teach ball on wall to glutes/piri, FOTO    Consulted and Agree with Plan of Care  Patient       Patient will benefit from skilled therapeutic intervention in order to improve the following deficits and impairments:  Decreased activity tolerance, Decreased strength, Increased fascial restricitons, Pain, Improper body mechanics, Decreased range of motion, Impaired flexibility, Postural dysfunction  Visit Diagnosis: Sacral pain  Muscle weakness (generalized)  Other symptoms and signs involving the musculoskeletal system     Problem List Patient Active Problem List   Diagnosis Date Noted  . Atypical squamous cells of undetermined significance (ASCUS) on Papanicolaou smear of cervix 01/17/2014  . Routine general medical examination at a health care facility 01/28/2013  . POTS (postural orthostatic tachycardia syndrome) 01/28/2013     Janene Harvey, PT, DPT 03/12/19 5:04 PM   [facility] [address]  [place], [place], [address] Phone: [phone]   Fax:  [phone]  Name: Debbie [name] MRN: [id number] Date of Birth: 11-23-1985    PHYSICAL THERAPY DISCHARGE SUMMARY  Visits from Start of Care: 2  Current functional level related to goals / functional outcomes: Unable to assess; patient did not return   Remaining deficits: Unable to assess   Education / Equipment: HEP  Plan: Patient agrees to discharge.  Patient goals were not met. Patient is being discharged due to not returning since the last visit.  ?????     Janene Harvey, PT, DPT 04/25/19 4:02 PM

## 2019-03-28 ENCOUNTER — Other Ambulatory Visit: Payer: Self-pay

## 2019-03-28 ENCOUNTER — Ambulatory Visit: Payer: Self-pay

## 2019-03-28 MED ORDER — MELOXICAM 15 MG PO TABS: 15.0000 mg | ORAL_TABLET | Freq: Every day | ORAL | 0 refills | Status: DC

## 2019-03-28 NOTE — Telephone Encounter (Signed)
Pt. Reports she has had this tingling in her legs and sacral pain since April. Has an appointment next week with sports medicine. Finished her Mobic 1 week ago. Reports it helped. Since being off of it her symptoms have become worse. Can this be refilled or what else can she do. Please advise pt. Answer Assessment - Initial Assessment Questions 1. SYMPTOM: "What is the main symptom you are concerned about?" (e.g., weakness, numbness)     Tingling to both legs 2. ONSET: "When did this start?" (minutes, hours, days; while sleeping)     Started last April 3. LAST NORMAL: "When was the last time you were normal (no symptoms)?"     April 4. PATTERN "Does this come and go, or has it been constant since it started?"  "Is it present now?"     Comes and goes 5. CARDIAC SYMPTOMS: "Have you had any of the following symptoms: chest pain, difficulty breathing, palpitations?"     No 6. NEUROLOGIC SYMPTOMS: "Have you had any of the following symptoms: headache, dizziness, vision loss, double vision, changes in speech, unsteady on your feet?"     No 7. OTHER SYMPTOMS: "Do you have any other symptoms?"     Sacral pain 8. PREGNANCY: "Is there any chance you are pregnant?" "When was your last menstrual period?"     No  Protocols used: NEUROLOGIC DEFICIT-A-AH

## 2019-03-28 NOTE — Addendum Note (Signed)
Addended by: [name] on: 03/28/2019 10:22 AM   Modules accepted: Orders

## 2019-04-03 ENCOUNTER — Ambulatory Visit: Payer: PRIVATE HEALTH INSURANCE | Admitting: Family Medicine

## 2019-04-03 ENCOUNTER — Encounter: Payer: Self-pay | Admitting: Family Medicine

## 2019-04-03 ENCOUNTER — Other Ambulatory Visit: Payer: Self-pay

## 2019-04-03 ENCOUNTER — Other Ambulatory Visit (INDEPENDENT_AMBULATORY_CARE_PROVIDER_SITE_OTHER): Payer: PRIVATE HEALTH INSURANCE

## 2019-04-03 VITALS — BP 100/80 | HR 69 | Ht 66.0 in | Wt 125.0 lb

## 2019-04-03 DIAGNOSIS — M533 Sacrococcygeal disorders, not elsewhere classified: Secondary | ICD-10-CM

## 2019-04-03 DIAGNOSIS — M2550 Pain in unspecified joint: Secondary | ICD-10-CM

## 2019-04-03 DIAGNOSIS — M999 Biomechanical lesion, unspecified: Secondary | ICD-10-CM | POA: Diagnosis not present

## 2019-04-03 LAB — CBC WITH DIFFERENTIAL/PLATELET
Basophils Absolute: 0 10*3/uL (ref 0.0–0.1)
Basophils Relative: 0.6 % (ref 0.0–3.0)
Eosinophils Absolute: 0.1 10*3/uL (ref 0.0–0.7)
Eosinophils Relative: 0.9 % (ref 0.0–5.0)
HCT: 43 % (ref 36.0–46.0)
Hemoglobin: 14.5 g/dL (ref 12.0–15.0)
Lymphocytes Relative: 31.4 % (ref 12.0–46.0)
Lymphs Abs: 1.9 10*3/uL (ref 0.7–4.0)
MCHC: 33.7 g/dL (ref 30.0–36.0)
MCV: 90.3 fl (ref 78.0–100.0)
Monocytes Absolute: 0.5 10*3/uL (ref 0.1–1.0)
Monocytes Relative: 8.2 % (ref 3.0–12.0)
Neutro Abs: 3.5 10*3/uL (ref 1.4–7.7)
Neutrophils Relative %: 58.9 % (ref 43.0–77.0)
Platelets: 206 10*3/uL (ref 150.0–400.0)
RBC: 4.77 Mil/uL (ref 3.87–5.11)
RDW: 12.7 % (ref 11.5–15.5)
WBC: 6 10*3/uL (ref 4.0–10.5)

## 2019-04-03 LAB — SEDIMENTATION RATE: Sed Rate: 7 mm/hr (ref 0–20)

## 2019-04-03 NOTE — Progress Notes (Signed)
[facility] [address] [place] Phone: [phone] Subjective:   I Debbie [name] am serving as a Education administrator for Dr. Hulan Saas.  This visit occurred during the SARS-CoV-2 public health emergency.  Safety protocols were in place, including screening questions prior to the visit, additional usage of staff PPE, and extensive cleaning of exam room while observing appropriate contact time as indicated for disinfecting solutions.   I'm seeing this patient by the request  of:    CC: Low back pain, leg numbness  [id number]  Debbie [name] is a 33 y.o. female coming in with complaint of lower extremity pain. Patient states that she has been experiencing numbness and tingling in her lower extremity since April while she is sleep. Working form home she sits in a hard chair and believes that is what caused the issue. Believes it is a pinched nerve. Intense pain in her sacrum. Has had xray of sacrum that was normal. Better with standing. ADL not a problem. Patient believes she is out of alignment.   Onset- April Location - lower extremity Duration-  Character- nerve pain Aggravating factors- sitting more than 5 minutes, laying down  Reliving factors-  Therapies tried- steroid, neurologist, nerve conduction study (normal), meloxicam, ice everyday once a day, heat makes it worse  Severity-  7/10 at its worse    Patient did see a neurologist and did have a nerve conduction study that was unremarkable.  This was independently visualized by me.  Past Medical History:  Diagnosis Date  . Abnormal heart rhythm   . History of chicken pox   . POTS (postural orthostatic tachycardia syndrome)    Past Surgical History:  Procedure Laterality Date  . TONSILLECTOMY AND ADENOIDECTOMY  1997  . WISDOM TOOTH EXTRACTION     all 4   Social History   Socioeconomic History  . Marital status: Single    Spouse name: Not on file  . Number of children: 0   . Years of education: Not on file  . Highest education level: Not on file  Occupational History  . Occupation: university professor  Tobacco Use  . Smoking status: Never Smoker  . Smokeless tobacco: Never Used  Substance and Sexual Activity  . Alcohol use: No  . Drug use: No  . Sexual activity: Yes    Birth control/protection: None  Other Topics Concern  . 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 Difficulty of Paying Living Expenses: Not on file  Food Insecurity:   . Worried About Charity fundraiser in the Last Year: Not on file  . Ran Out of Food in the Last Year: Not on file  Transportation Needs:   . Lack of Transportation (Medical): Not on file  . Lack of Transportation (Non-Medical): Not on file  Physical Activity:   . Days of Exercise per Week: Not on file  . Minutes of Exercise per Session: Not on file  Stress:   . Feeling of Stress : Not on file  Social Connections:   . Frequency of Communication with Friends and Family: Not on file  . Frequency of Social Gatherings with Friends and Family: Not on file  . Attends Religious Services: Not on file  . Active Member of  Clubs or Organizations: Not on file  . Attends Banker Meetings: Not on file  . Marital Status: Not on file   Allergies  Allergen Reactions  . Gluten Meal    Family History  Problem Relation Age of Onset  . Osteoporosis Mother   . Hyperlipidemia Father   . Heart disease Father        CAD/defibrillator age 51  . Hypertension Father   . Diabetes Father        type 2  . Colon cancer Neg Hx   . Esophageal cancer Neg Hx   . Stomach cancer Neg Hx        Current Outpatient Medications (Analgesics):  .  meloxicam (MOBIC) 15 MG tablet, Take 1 tablet (15 mg total) by mouth daily.      Past medical  history, social, surgical and family history all reviewed in electronic medical record.  No pertanent information unless stated regarding to the chief complaint.   Review of Systems:  No headache, visual changes, nausea, vomiting, diarrhea, constipation, dizziness, abdominal pain, skin rash, fevers, chills, night sweats, weight loss, swollen lymph nodes, body aches, joint swelling,  chest pain, shortness of breath, mood changes.  Positive muscle aches, numbness  Objective  Blood pressure 100/80, pulse 69, height 5\' 6"  (1.676 m), weight 125 lb (56.7 kg), SpO2 98 %.    General: No apparent distress alert and oriented x3 mood and affect normal, dressed appropriately.  HEENT: Pupils equal, extraocular movements intact  Respiratory: Patient's speak in full sentences and does not appear short of breath  Cardiovascular: No lower extremity edema, non tender, no erythema  Skin: Warm dry intact with no signs of infection or rash on extremities or on axial skeleton.  Abdomen: Soft nontender  Neuro: Cranial nerves II through XII are intact, neurovascularly intact in all extremities with 2+ DTRs and 2+ pulses.  Lymph: No lymphadenopathy of posterior or anterior cervical chain or axillae bilaterally.  Gait normal with good balance and coordination.  MSK:   tender with full range of motion and good stability and symmetric strength and tone of shoulders, elbows, wrist, hip, knee and ankles bilaterally.  Mild hypermobility noted Low back exam shows the patient does have some tenderness to palpation of the sacroiliac joint bilaterally.  Mild positive on the right side.  Patient does have what appears to be in any either a previous scar tissue formation of the hamstring at the ischial bursa area or a large bursal sac noted.  Negative straight leg test though.  5-5 strength of lower extremities.  Osteopathic findings  C2 flexed rotated and side bent right C4 flexed rotated and side bent left C6 flexed  rotated and side bent left T3 extended rotated and side bent right inhaled third rib T9 extended rotated and side bent left L2 flexed rotated and side bent right Sacrum right on right     Impression and Recommendations:     This case required medical decision making of moderate complexity. The above documentation has been reviewed and is accurate and complete Pearlean Brownie, DO       Note: This dictation was prepared with Dragon dictation along with smaller phrase technology. Any transcriptional errors that result from this process are unintentional.

## 2019-04-03 NOTE — Assessment & Plan Note (Signed)
Patient does have more of a sacroiliac dysfunction.  Discussed over-the-counter medications.  Patient wanted to hold on any type of prescription medications.  Patient has had pain that seems to be out of proportion to the amount that would be considerable for this individual.  Further with lab work-up ordered today due to the duration of all this as well.  Discussed icing regimen and home exercise, discussed which activities to do which wants to avoid.  Patient should increase activity slowly.  Follow-up with me again for 4 weeks.  Responded fairly well to osteopathic manipulation

## 2019-04-03 NOTE — Assessment & Plan Note (Signed)
Decision today to treat with OMT was based on Physical Exam  After verbal consent patient was treated with HVLA, ME, FPR techniques in cervical, thoracic, rib,  lumbar and sacral areas  Patient tolerated the procedure well with improvement in symptoms  Patient given exercises, stretches and lifestyle modifications  See medications in patient instructions if given  Patient will follow up in 4-8 weeks 

## 2019-04-03 NOTE — Patient Instructions (Addendum)
Adjustable standing desk Exercise 3 times a week  Vitamin D 2000 IU  Labs today Ice 2 times a day after activity See me again in 4 weeks

## 2019-04-04 LAB — COMPREHENSIVE METABOLIC PANEL
ALT: 14 U/L (ref 0–35)
AST: 19 U/L (ref 0–37)
Albumin: 4.7 g/dL (ref 3.5–5.2)
Alkaline Phosphatase: 58 U/L (ref 39–117)
BUN: 22 mg/dL (ref 6–23)
CO2: 25 mEq/L (ref 19–32)
Calcium: 9.4 mg/dL (ref 8.4–10.5)
Chloride: 104 mEq/L (ref 96–112)
Creatinine, Ser: 0.7 mg/dL (ref 0.40–1.20)
GFR: 94.9 mL/min (ref >60.00)
Glucose, Bld: 86 mg/dL (ref 70–99)
Potassium: 4 mEq/L (ref 3.5–5.1)
Sodium: 138 mEq/L (ref 135–145)
Total Bilirubin: 0.8 mg/dL (ref 0.2–1.2)
Total Protein: 6.9 g/dL (ref 6.0–8.3)

## 2019-04-04 LAB — TSH: TSH: 1.55 u[IU]/mL (ref 0.35–4.50)

## 2019-04-04 LAB — URIC ACID: Uric Acid, Serum: 3.2 mg/dL (ref 2.4–7.0)

## 2019-04-04 LAB — FERRITIN: Ferritin: 14.8 ng/mL (ref 10.0–291.0)

## 2019-04-04 LAB — IBC PANEL
Iron: 141 ug/dL (ref 42–145)
Saturation Ratios: 44 % (ref 20.0–50.0)
Transferrin: 229 mg/dL (ref 212.0–360.0)

## 2019-04-04 LAB — C-REACTIVE PROTEIN: CRP: <1 mg/dL (ref 0.5–20.0)

## 2019-04-04 LAB — T4, FREE: Free T4: 0.95 ng/dL (ref 0.60–1.60)

## 2019-04-04 LAB — T3, FREE: T3, Free: 3.2 pg/mL (ref 2.3–4.2)

## 2019-04-08 ENCOUNTER — Telehealth: Payer: Self-pay | Admitting: Family Medicine

## 2019-04-08 NOTE — Telephone Encounter (Signed)
Patient called stating that she has not noticed much improvement since her visit last week. Over the weekend she was waking up every hour during the night with pain and numbness. She has been trying to do the exercies that she was shown but they seem to make the pain worse. She was concerned and wanted to see if there was anything else she could do. Please advise.   Pt can be reached at [phone].

## 2019-04-09 ENCOUNTER — Encounter: Payer: Self-pay | Admitting: Family Medicine

## 2019-04-09 ENCOUNTER — Ambulatory Visit (INDEPENDENT_AMBULATORY_CARE_PROVIDER_SITE_OTHER): Payer: PRIVATE HEALTH INSURANCE

## 2019-04-09 ENCOUNTER — Ambulatory Visit (INDEPENDENT_AMBULATORY_CARE_PROVIDER_SITE_OTHER): Payer: PRIVATE HEALTH INSURANCE | Admitting: Family Medicine

## 2019-04-09 ENCOUNTER — Other Ambulatory Visit: Payer: Self-pay

## 2019-04-09 VITALS — BP 100/70 | HR 59 | Ht 66.0 in | Wt 124.0 lb

## 2019-04-09 DIAGNOSIS — M5416 Radiculopathy, lumbar region: Secondary | ICD-10-CM | POA: Diagnosis not present

## 2019-04-09 DIAGNOSIS — M2550 Pain in unspecified joint: Secondary | ICD-10-CM | POA: Diagnosis not present

## 2019-04-09 DIAGNOSIS — M545 Low back pain, unspecified: Secondary | ICD-10-CM

## 2019-04-09 LAB — CYCLIC CITRUL PEPTIDE ANTIBODY, IGG: Cyclic Citrullin Peptide Ab: <16 UNITS

## 2019-04-09 LAB — EXTRA SPECIMEN

## 2019-04-09 LAB — PTH, INTACT AND CALCIUM
Calcium: 9.3 mg/dL (ref 8.6–10.2)
PTH: 40 pg/mL (ref 14–64)

## 2019-04-09 LAB — CALCIUM, IONIZED: Calcium, Ion: 5.04 mg/dL (ref 4.8–5.6)

## 2019-04-09 LAB — ANGIOTENSIN CONVERTING ENZYME: Angiotensin-Converting Enzyme: 25 U/L (ref 9–67)

## 2019-04-09 LAB — VITAMIN D 1,25 DIHYDROXY
Vitamin D 1, 25 (OH)2 Total: 75 pg/mL — ABNORMAL HIGH (ref 18–72)
Vitamin D2 1, 25 (OH)2: <8 pg/mL
Vitamin D3 1, 25 (OH)2: 75 pg/mL

## 2019-04-09 LAB — ANA: Anti Nuclear Antibody (ANA): NEGATIVE

## 2019-04-09 LAB — RHEUMATOID FACTOR: Rheumatoid fact SerPl-aCnc: <14 IU/mL (ref <14)

## 2019-04-09 IMAGING — DX DG LUMBAR SPINE COMPLETE 4+V
5 series · 5 of 5 positions shown · non-contrast
Comparison: Sacral radiographs [DATE]

CLINICAL DATA: Lower back and sacral pain for 6 months

EXAM:
LUMBAR SPINE - COMPLETE 4+ VIEW

[lumbar spine ap]
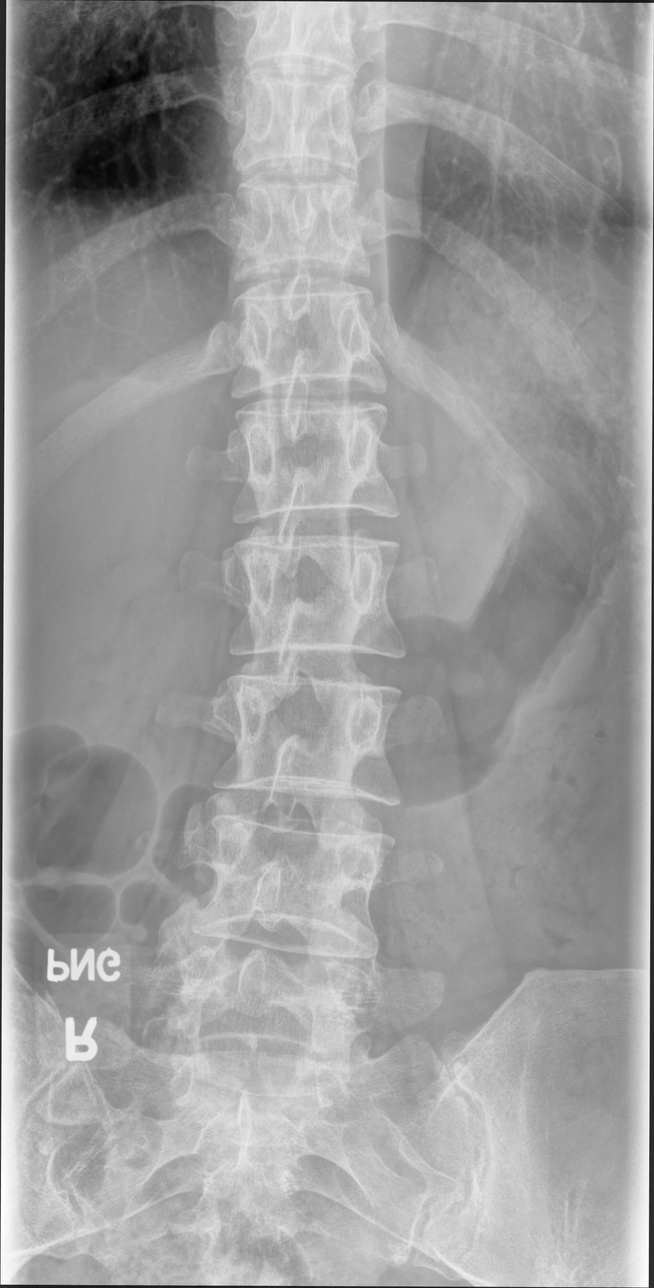

[lumbar spine mlo (1 of 2)]
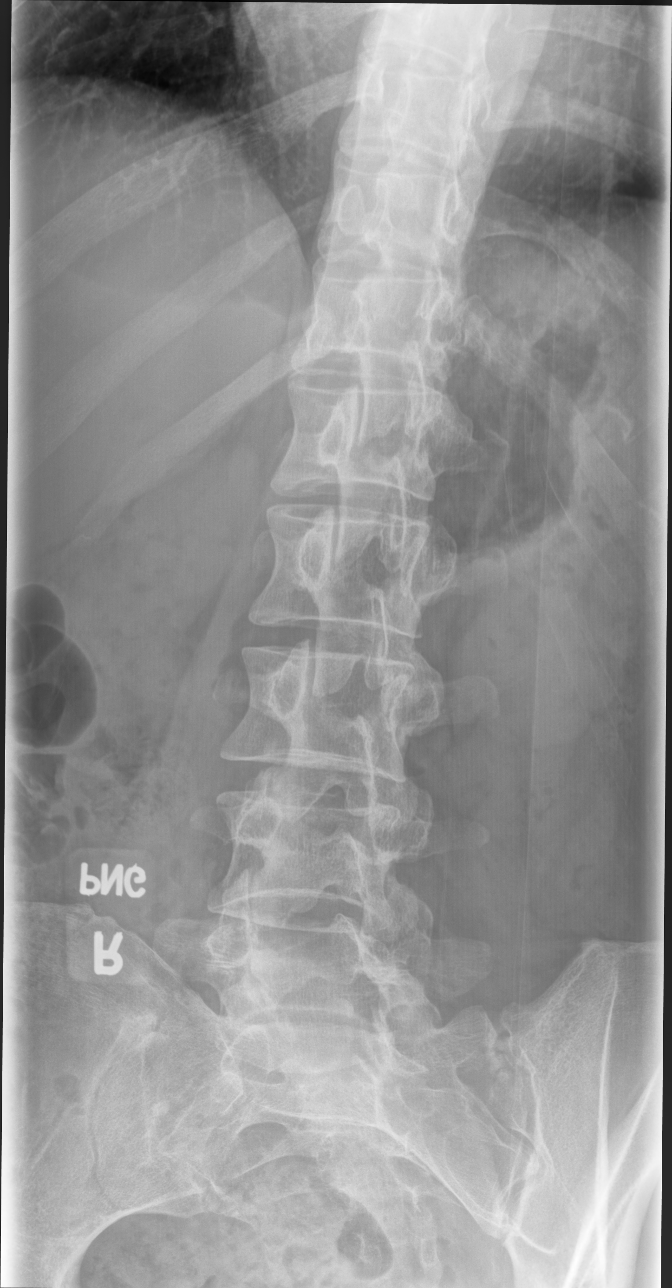

[lumbar spine mlo (2 of 2)]
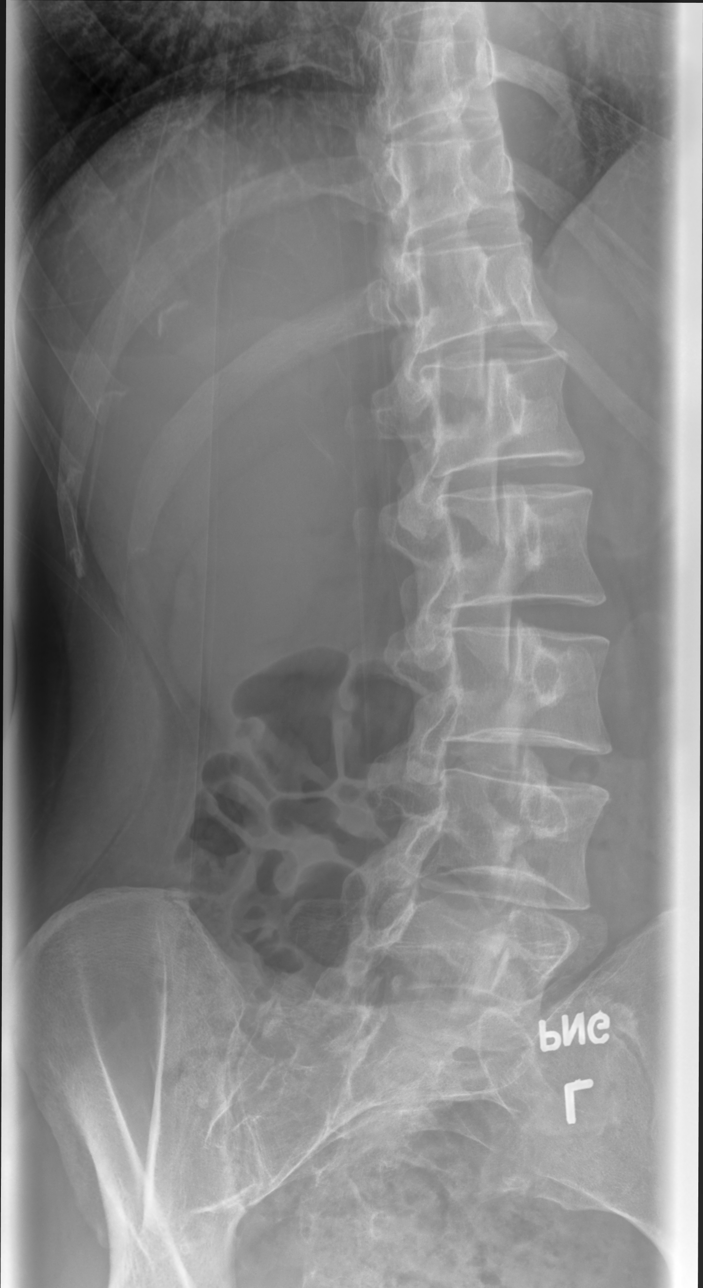

[lumbar spine lat (1 of 2)]
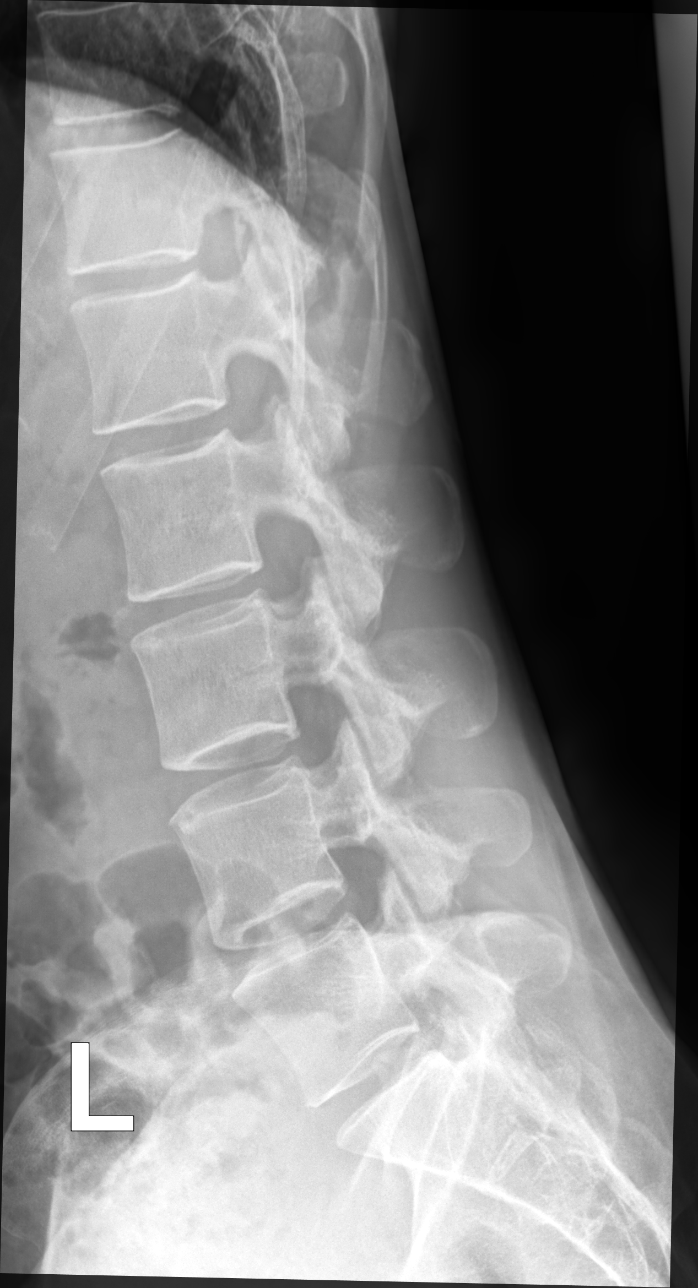

[lumbar spine lat (2 of 2)]
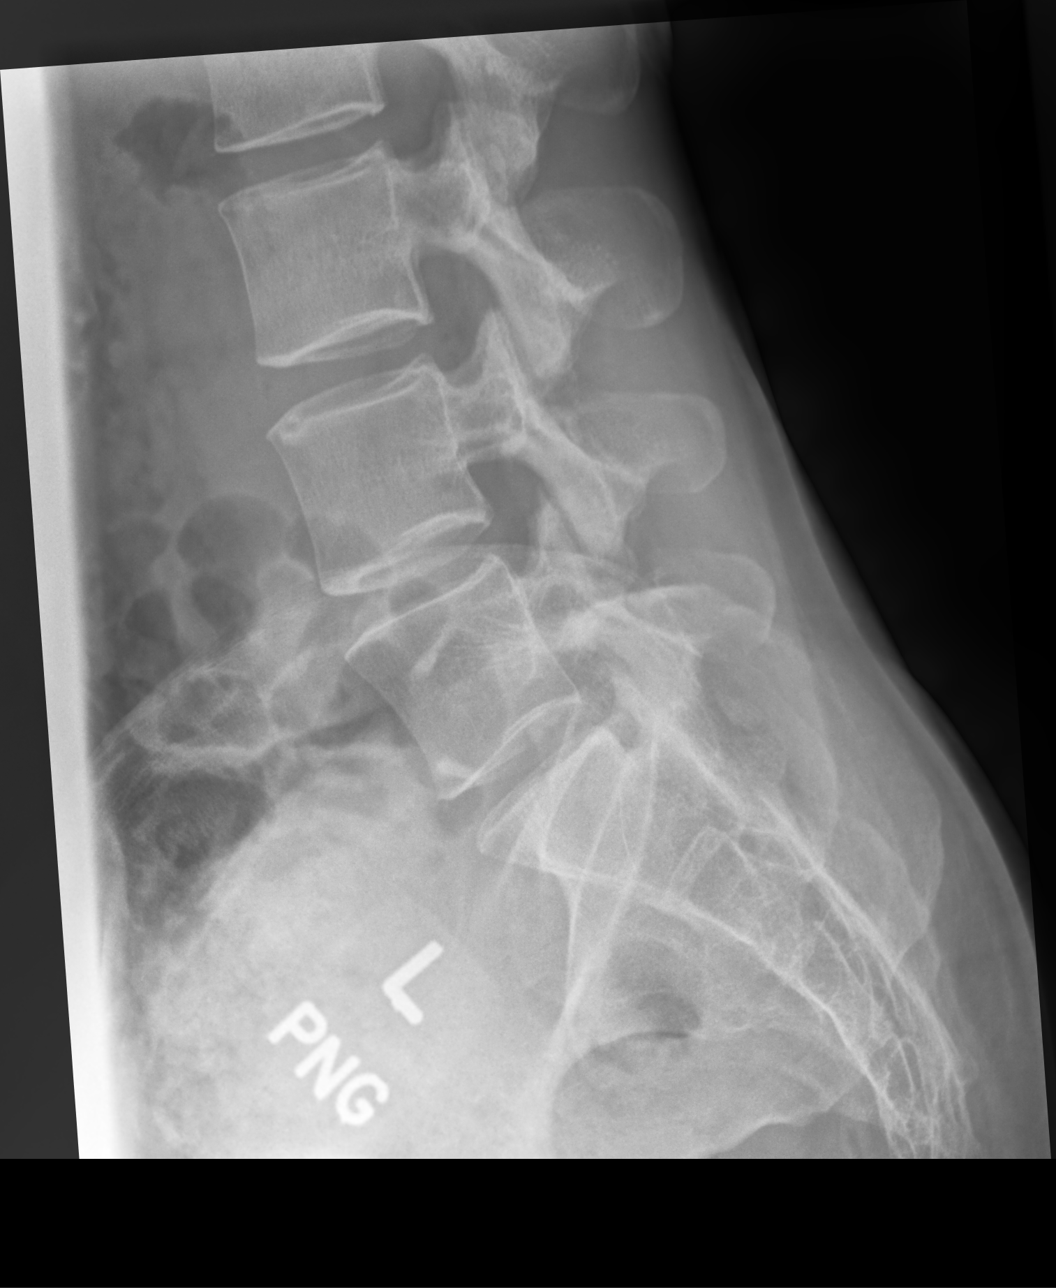

[5 of 5 positions shown; findings below may reference images not displayed]

FINDINGS: Five non-rib-bearing lumbar type vertebral bodies are present. Mild
levocurvature lumbar spine with apex at L3. No acute fracture
vertebral body height loss. No acute fracture or vertebral body
height loss. Intervertebral disc spaces are largely maintained. Mild
facet degenerative change at L5-S1.
IMPRESSION: 1. No acute bony abnormality.
2. Mild facet degenerative change at L5-S1.
3. Mild lumbar levocurvature, apex at L3.

## 2019-04-09 MED ORDER — METHYLPREDNISOLONE ACETATE 80 MG/ML IJ SUSP
80.0000 mg | Freq: Once | INTRAMUSCULAR | Status: AC
  Administered 2019-04-09: 80 mg via INTRAMUSCULAR

## 2019-04-09 MED ORDER — METHYLPREDNISOLONE ACETATE 80 MG/ML IJ SUSP: 80.0000 mg | Freq: Once | INTRAMUSCULAR | Status: DC

## 2019-04-09 MED ORDER — KETOROLAC TROMETHAMINE 60 MG/2ML IM SOLN
60.0000 mg | Freq: Once | INTRAMUSCULAR | Status: AC
  Administered 2019-04-09: 60 mg via INTRAMUSCULAR

## 2019-04-09 MED ORDER — KETOROLAC TROMETHAMINE 60 MG/2ML IM SOLN: 60.0000 mg | Freq: Once | INTRAMUSCULAR | Status: DC

## 2019-04-09 NOTE — Progress Notes (Signed)
[name] [address] [place] [address] Phone: [phone] Subjective:   I Debbie [name] am serving as a Neurosurgeon for Dr. Antoine Primas.  This visit occurred during the SARS-CoV-2 public health emergency.  Safety protocols were in place, including screening questions prior to the visit, additional usage of staff PPE, and extensive cleaning of exam room while observing appropriate contact time as indicated for disinfecting solutions.    CC:   Low back pain follow-up  [id number]  Debbie [name] is a 34 y.o. female coming in with complaint of back pain. States that she has gotten worse over the holidays. Pain is worsening in back and radicular symptoms. States she was a little better yesterday.  Patient was seen previously and attempted osteopathic manipulation which patient did state resolute in a decent amount of pain and was feeling significantly better than she had for some time but then unfortunately now having worsening pain again.  Patient does not know exactly what it was.  Unable to lay on her back or even sit without severe discomfort.  Minimally responded to anti-inflammatories.     Past Medical History:  Diagnosis Date  . Abnormal heart rhythm   . History of chicken pox   . POTS (postural orthostatic tachycardia syndrome)    Past Surgical History:  Procedure Laterality Date  . TONSILLECTOMY AND ADENOIDECTOMY  1997  . WISDOM TOOTH EXTRACTION     all 4   Social History   Socioeconomic History  . Marital status: Single    Spouse name: Not on file  . Number of children: 0  . Years of education: Not on file  . Highest education level: Not on file  Occupational History  . Occupation: university professor  Tobacco Use  . Smoking status: Never Smoker  . Smokeless tobacco: Never Used  Substance and Sexual Activity  . Alcohol use: No  . Drug use: No  . Sexual activity: Yes    Birth control/protection: None  Other Topics  Concern  . 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 Difficulty of Paying Living Expenses: Not on file  Food Insecurity:   . Worried About Programme researcher, broadcasting/film/video in the Last Year: Not on file  . Ran Out of Food in the Last Year: Not on file  Transportation Needs:   . Lack of Transportation (Medical): Not on file  . Lack of Transportation (Non-Medical): Not on file  Physical Activity:   . Days of Exercise per Week: Not on file  . Minutes of Exercise per Session: Not on file  Stress:   . Feeling of Stress : Not on file  Social Connections:   . Frequency of Communication with Friends and Family: Not on file  . Frequency of Social Gatherings with Friends and Family: Not on file  . Attends Religious Services: Not on file  . Active Member of Clubs or Organizations: Not on file  . Attends Banker Meetings: Not on file  . Marital Status: Not on file   Allergies  Allergen Reactions  . Gluten Meal    Family History  Problem Relation Age of Onset  . Osteoporosis Mother   . Hyperlipidemia Father   . Heart disease Father  CAD/defibrillator age 5  . Hypertension Father   . Diabetes Father        type 2  . Colon cancer Neg Hx   . Esophageal cancer Neg Hx   . Stomach cancer Neg Hx        Current Outpatient Medications (Analgesics):  .  meloxicam (MOBIC) 15 MG tablet, Take 1 tablet (15 mg total) by mouth daily.      Past medical history, social, surgical and family history all reviewed in electronic medical record.  No pertanent information unless stated regarding to the chief complaint.   Review of Systems:  No headache, visual changes, nausea, vomiting, diarrhea, constipation, dizziness, abdominal pain, skin rash, fevers, chills, night sweats, weight loss, swollen lymph nodes,  body aches, joint swelling,  chest pain, shortness of breath, mood changes.  Positive muscle aches  Objective  Blood pressure 100/70, pulse (!) 59, height 5\' 6"  (1.676 m), weight 124 lb (56.2 kg), last menstrual period 04/08/2019, SpO2 98 %.    General: No apparent distress alert and oriented x3 mood and affect normal, dressed appropriately.  HEENT: Pupils equal, extraocular movements intact  Respiratory: Patient's speak in full sentences and does not appear short of breath  Cardiovascular: No lower extremity edema, non tender, no erythema  Skin: Warm dry intact with no signs of infection or rash on extremities or on axial skeleton.  Abdomen: Soft nontender  Neuro: Cranial nerves II through XII are intact, neurovascularly intact in all extremities with 2+ DTRs and 2+ pulses.  Lymph: No lymphadenopathy of posterior or anterior cervical chain or axillae bilaterally.  Gait normal with good balance and coordination.  MSK:  Non tender with full range of motion and good stability and symmetric strength and tone of shoulders, elbows, wrist, hip, knee and ankles bilaterally.  Low back exam no shows the patient does have some discomfort noted around the sacroiliac joints bilaterally.  Worsening pain with extension of the back.  Patient does have possibly mild radicular symptoms which is new from previous exam on the right side.  Strength though appears to be intact.  Patient still pain to palpation of the lower back did seem to be out of proportion to the amount of palpation.  Range of motion other than extension seem to be full.   Impression and Recommendations:     This case required medical decision making of moderate complexity. The above documentation has been reviewed and is accurate and complete [name], DO       Note: This dictation was prepared with Dragon dictation along with smaller phrase technology. Any transcriptional errors that result from this process are unintentional.

## 2019-04-09 NOTE — Patient Instructions (Addendum)
Back xray today [place]  [facility] x Alvester Morin

## 2019-04-09 NOTE — Assessment & Plan Note (Signed)
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  Patient notes pain is out of proportion to the amount of findings suggested today.  At this point patient has failed all conservative therapy including formal physical therapy, I do think that a MRI would be necessary at this point.  Like to rule out things such as congenital spinal stenosis, or a cystic formation that could be contributing this as well.  Depending on findings she could be a candidate for a epidural if necessary.  Otherwise if also normal with return to more formal physical therapy.  Further work-up by gynecology could also be there but I think it it does seem to be more muscular in nature.  Follow-up after imaging.  Spent  25 minutes with patient face-to-face and had greater than 50% of counseling including as described above in assessment and plan.

## 2019-04-09 NOTE — Telephone Encounter (Signed)
Patient called back to follow up. She did schedule and appointment for today to be seen by Dr [name].

## 2019-04-10 ENCOUNTER — Encounter: Payer: Self-pay | Admitting: Family Medicine

## 2019-04-10 NOTE — Assessment & Plan Note (Signed)
Patient is now having some mild signs of lumbar radiculopathy.  Concern for an L5-S1 area.  Patient has had x-rays of the sacroiliac joint that was fairly unremarkable.  Lumbar x-rays ordered today but I do feel that advanced imaging is warranted with now pain being greater than 6 months that seems to be worsening and not responding to formal physical therapy, oral medications or even IM medications.  Toradol and Depo-Medrol given today again to try to help.  Discussed depending on the advanced imaging patient could be a candidate for epidurals if necessary.  Ruling out any type of cyst formation or occult fracture I think is also necessary before we continue with certain therapy.  Spent  25 minutes with patient face-to-face and had greater than 50% of counseling including as described above in assessment and plan.

## 2019-04-12 ENCOUNTER — Encounter: Payer: Self-pay | Admitting: Family Medicine

## 2019-04-14 ENCOUNTER — Encounter: Payer: Self-pay | Admitting: Family Medicine

## 2019-04-15 ENCOUNTER — Telehealth: Payer: Self-pay

## 2019-04-15 ENCOUNTER — Other Ambulatory Visit: Payer: Self-pay

## 2019-04-15 MED ORDER — GABAPENTIN 100 MG PO CAPS: 200.0000 mg | ORAL_CAPSULE | Freq: Every day | ORAL | 0 refills | Status: DC

## 2019-04-15 NOTE — Telephone Encounter (Signed)
Spoke with patient. Dr. [name] will call in gabapentin for sleep.

## 2019-04-15 NOTE — Telephone Encounter (Signed)
Patient called stating that she is still experiencing pain causing her to not be able sleep, sit, walk and has constant pain. Would like to be worked in if she can today. States that she did get the message back from Dr. [name] to give it more time, but she is wanting to be seen because the pain is so bad and she has to go to work. States that she could be worked in around 2:30 if she does not answer can leave a detailed message on phone.

## 2019-04-17 ENCOUNTER — Encounter: Payer: Self-pay | Admitting: Family Medicine

## 2019-04-17 MED ORDER — PREDNISONE 50 MG PO TABS: 50.0000 mg | ORAL_TABLET | Freq: Every day | ORAL | 0 refills | Status: DC

## 2019-04-25 ENCOUNTER — Encounter: Payer: Self-pay | Admitting: Family Medicine

## 2019-04-25 ENCOUNTER — Ambulatory Visit
Admission: RE | Admit: 2019-04-25 | Discharge: 2019-04-25 | Disposition: A | Discharge status: Home / Self Care | Payer: PRIVATE HEALTH INSURANCE | Source: Ambulatory Visit | Attending: Emergency Medicine | Admitting: Emergency Medicine

## 2019-04-25 ENCOUNTER — Other Ambulatory Visit: Payer: Self-pay

## 2019-04-25 DIAGNOSIS — M533 Sacrococcygeal disorders, not elsewhere classified: Secondary | ICD-10-CM | POA: Diagnosis not present

## 2019-04-25 MED ORDER — PREDNISONE 20 MG PO TABS: 40.0000 mg | ORAL_TABLET | Freq: Every day | ORAL | 0 refills | Status: AC

## 2019-04-25 NOTE — ED Provider Notes (Signed)
[facility] VIDEO VISIT Provider Note   CSN: [id number] Arrival date & time: 04/25/19  1923     History Chief Complaint  Patient presents with  . Back Pain    Debbie [name] is a 34 y.o. female.  HPI    Pt is a 34 y/o F presenting with low back pain. States that she has been having chronic ongoing pain to the sacral area since 09/2018. She has been following with sports medicine and has been in physical therapy. She is currently on meloxicam and gabapentin. She has an MRI scheduled in the next few days. States she was on prednisone last week and finished it a few days ago. She had been having some numbness/tingling to the legs when sleeping, but after taking prednisone those sxs have resolved. She states that her pain had improved while taking the prednisone as well but now that she has ended it, her pain is worsening. Denies radiation of pain to the BLE.  Pt denies any numbness/tingling/weakness to the BLE. Denies saddle anesthesia. Denies loss of control of bowels or bladder. No urinary retention. No fevers. Denies a h/o IVDU. Denies a h/o CA.   Past Medical History:  Diagnosis Date  . Abnormal heart rhythm   . History of chicken pox   . POTS (postural orthostatic tachycardia syndrome)     Patient Active Problem List   Diagnosis Date Noted  . Lumbar radiculopathy 04/09/2019  . 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 Nonallopathic lesion of sacral region 04/03/2019  . Nonallopathic lesion of lumbosacral region 04/03/2019  . Nonallopathic lesion of thoracic region 04/03/2019  . Nonallopathic lesion of cervical region 04/03/2019  . Nonallopathic lesion of rib cage 04/03/2019  . Atypical squamous cells of undetermined significance (ASCUS) on Papanicolaou smear of cervix 01/17/2014  . Routine general medical examination at a health care facility 01/28/2013  . POTS (postural orthostatic tachycardia syndrome) 01/28/2013    Past Surgical History:    Procedure Laterality Date  . TONSILLECTOMY AND ADENOIDECTOMY  1997  . WISDOM TOOTH EXTRACTION     all 4     OB History   No obstetric history on file.     Family History  Problem Relation Age of Onset  . Osteoporosis Mother   . Hyperlipidemia Father   . Heart disease Father        CAD/defibrillator age 36  . Hypertension Father   . Diabetes Father        type 2  . Colon cancer Neg Hx   . Esophageal cancer Neg Hx   . Stomach cancer Neg Hx     Social History   Tobacco Use  . Smoking status: Never Smoker  . Smokeless tobacco: Never Used  Substance Use Topics  . Alcohol use: No  . Drug use: No    Home Medications Prior to Admission medications   Medication Sig Start Date End Date Taking? Authorizing Provider  gabapentin (NEURONTIN) 100 MG capsule Take 2 capsules (200 mg total) by mouth at bedtime. 04/15/19   Judi Saa, DO  meloxicam (MOBIC) 15 MG tablet Take 1 tablet (15 mg total) by mouth daily. 03/28/19   Sandford Craze, NP  predniSONE (DELTASONE) 20 MG tablet Take 2 tablets (40 mg total) by mouth daily for 5 days. 04/25/19 04/30/19  Lashonda Sonneborn S, PA-C    Allergies    Gluten meal  Review of Systems   Review of Systems  Constitutional: Negative for fever.  Respiratory: Negative for shortness of  breath.   Cardiovascular: Negative for chest pain.  Gastrointestinal: Negative for abdominal pain, constipation, diarrhea, nausea and vomiting.  Genitourinary: Negative for dysuria, flank pain, frequency, hematuria and urgency.  Musculoskeletal: Positive for back pain. Negative for gait problem.  Skin: Negative for wound.  Neurological: Negative for weakness and numbness.    Physical Exam Updated Vital Signs LMP 04/08/2019   Physical Exam Vitals and nursing note reviewed.  Constitutional:      Comments: Pt is in no distress  Pulmonary:     Comments: Pt speaking in full sentences without tachypnea Neurological:     Mental Status: She is alert.      Comments: Clear speech     ED Results / Procedures / Treatments   Labs (all labs ordered are listed, but only abnormal results are displayed) Labs Reviewed - No data to display  EKG None  Radiology No results found.  Procedures Procedures (including critical care time)  Medications Ordered in ED Medications - No data to display  ED Course  I have reviewed the triage vital signs and the nursing notes.  Pertinent labs & imaging results that were available during my care of the patient were reviewed by me and considered in my medical decision making (see chart for details).    MDM Rules/Calculators/A&P                     34 y/o F presenting with pain to the sacrum which has been present for at least 6 months but is worsening over the last few days.   Denies red flag signs or sxs at this time. She was previously on prednisone last week and this has been the only medication that has provided any relief thus far. She is requesting rx for prednisone again since it was the only med that helped her. I discussed the risks/benefits of repeated steroid use and pt voices understanding of this. I will give her a short course of reduced dose prednisone to see if this helps her symptoms. I also advised that she f/u with her sports medicine provider tomorrow morning to discuss other possible tx options. She is in agreement to do this. Advised on sxs that would warrant emergent in person eval and she voiced understanding. All questions answered, pt stable for d/c.   Final Clinical Impression(s) / ED Diagnoses Final diagnoses:  Sacral back pain    Rx / DC Orders ED Discharge Orders         Ordered    predniSONE (DELTASONE) 20 MG tablet  Daily     04/25/19 2033           Bishop Dublin 04/25/19 2034    Valarie Merino, MD 04/25/19 2202

## 2019-04-25 NOTE — Discharge Instructions (Signed)
Please follow up with your regular sports medicine physician tomorrow to discuss your symptoms.   Return to the emergency department immediately if you experience any back pain associated with fevers, loss of control of your bowels/bladder, weakness/numbness to your legs, numbness to your groin area, inability to walk, or inability to urinate.

## 2019-04-26 ENCOUNTER — Other Ambulatory Visit: Payer: PRIVATE HEALTH INSURANCE

## 2019-04-26 ENCOUNTER — Ambulatory Visit: Payer: PRIVATE HEALTH INSURANCE | Admitting: Family

## 2019-04-26 MED ORDER — MELOXICAM 15 MG PO TABS: 15.0000 mg | ORAL_TABLET | Freq: Every day | ORAL | 0 refills | Status: DC

## 2019-04-27 ENCOUNTER — Ambulatory Visit
Admission: RE | Admit: 2019-04-27 | Discharge: 2019-04-27 | Disposition: A | Discharge status: Home / Self Care | Payer: PRIVATE HEALTH INSURANCE | Source: Ambulatory Visit | Attending: Family Medicine | Admitting: Family Medicine

## 2019-04-27 ENCOUNTER — Other Ambulatory Visit: Payer: Self-pay

## 2019-04-27 DIAGNOSIS — M545 Low back pain, unspecified: Secondary | ICD-10-CM

## 2019-04-27 IMAGING — MR MR LUMBAR SPINE W/O CM
4 of 5 series · 19 of 48 positions shown · non-contrast
Comparison: Plain films lumbar spine [DATE].

CLINICAL DATA: Low back pain and bilateral lower extremity numbness
since [DATE]. No known injury.

EXAM:
MRI LUMBAR SPINE WITHOUT CONTRAST
TECHNIQUE: Multiplanar, multisequence MR imaging of the lumbar spine was
performed. No intravenous contrast was administered.

[Series 6: T2 · sagittal · 4.0mm · 0.73mm/px · 6 of 15 slices shown (1 of 2)]
[im 1/15]
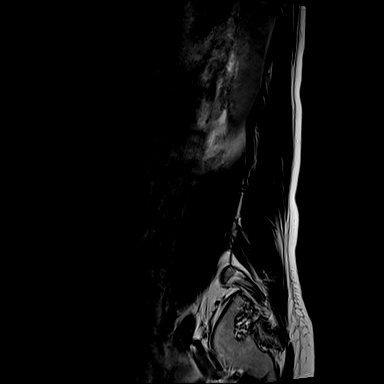
[im 3/15]
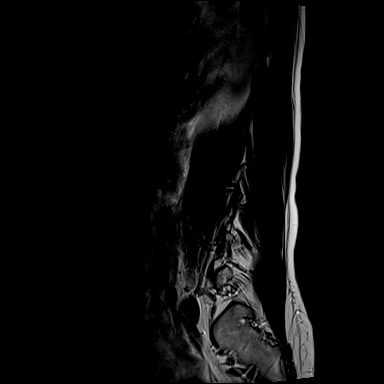
[im 6/15]
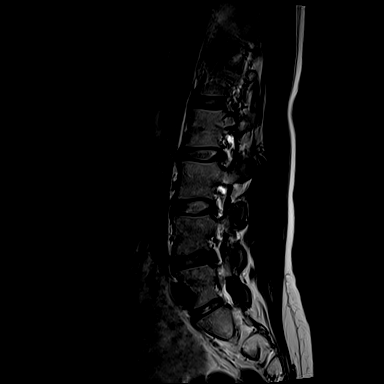
[im 9/15]
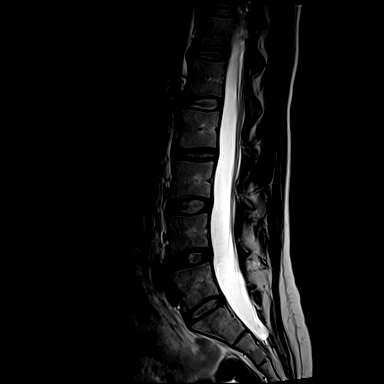
[im 12/15]
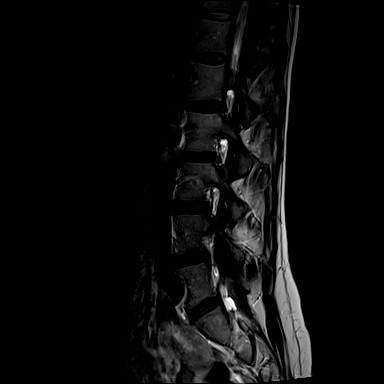
[im 15/15]
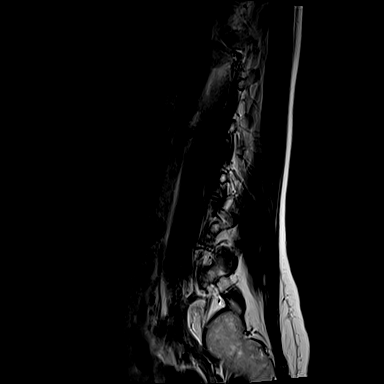

[Series 7: T1 · sagittal · 4.0mm · 0.73mm/px · 3 of 15 slices shown (1 of 2)]
[im 3/15]
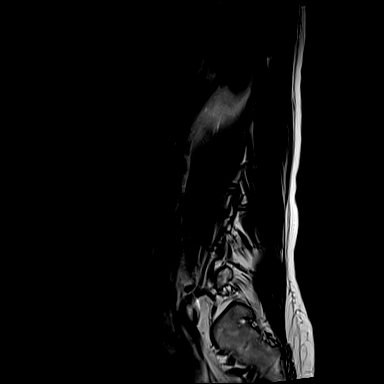
[im 9/15]
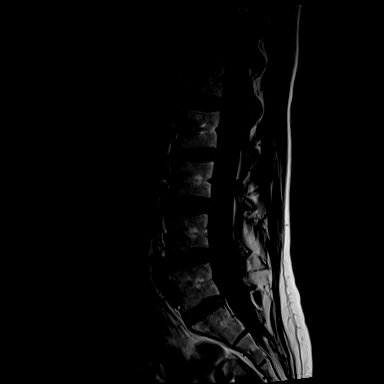
[im 15/15]
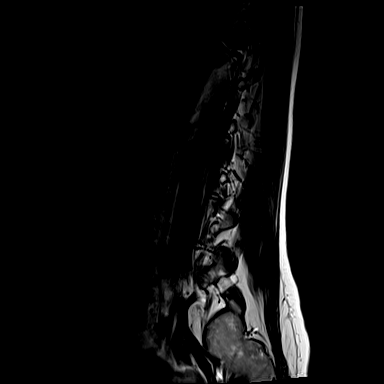

[Series 11: T1 · axial · 4.0mm · 0.28mm/px · z∈[-155,+12]mm · 3 of 41 slices shown (2 of 2)]
[im 6/41]
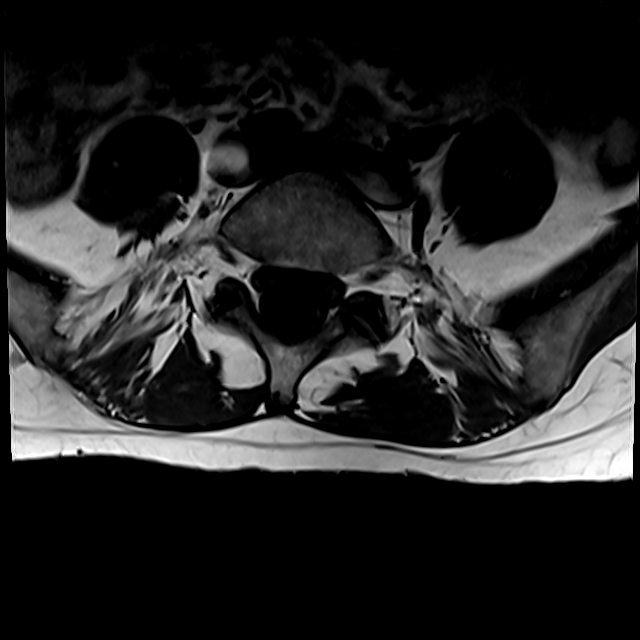
[im 21/41]
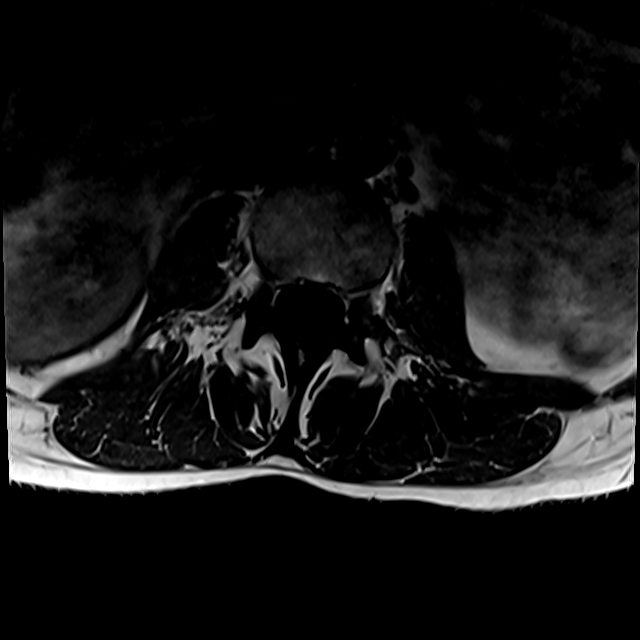
[im 35/41]
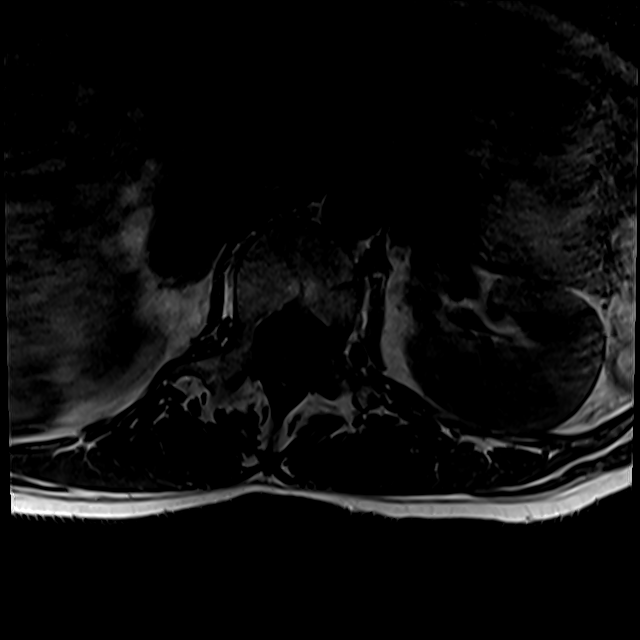

[Series 14: T2 · axial · 4.0mm · 0.28mm/px · z∈[-179,+12]mm · 7 of 41 slices shown (2 of 2)]
[im 1/41]
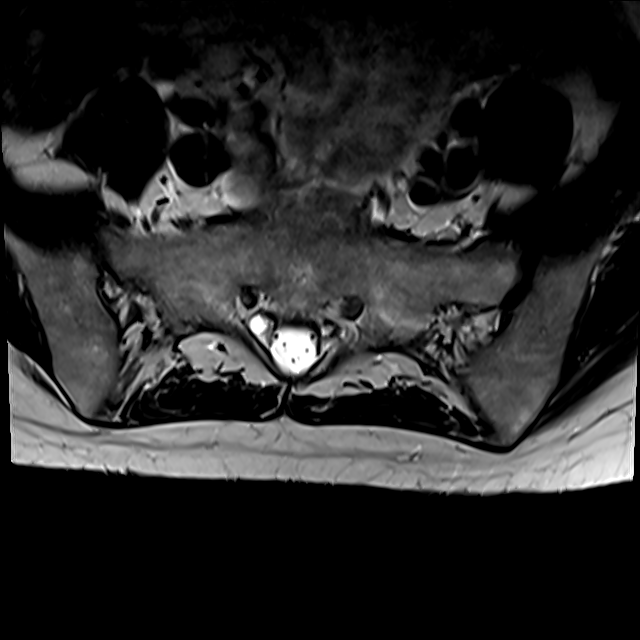
[im 6/41]
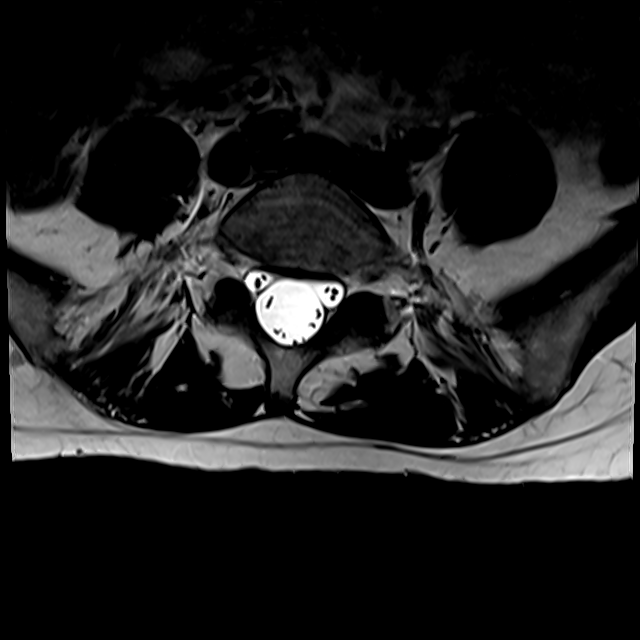
[im 12/41]
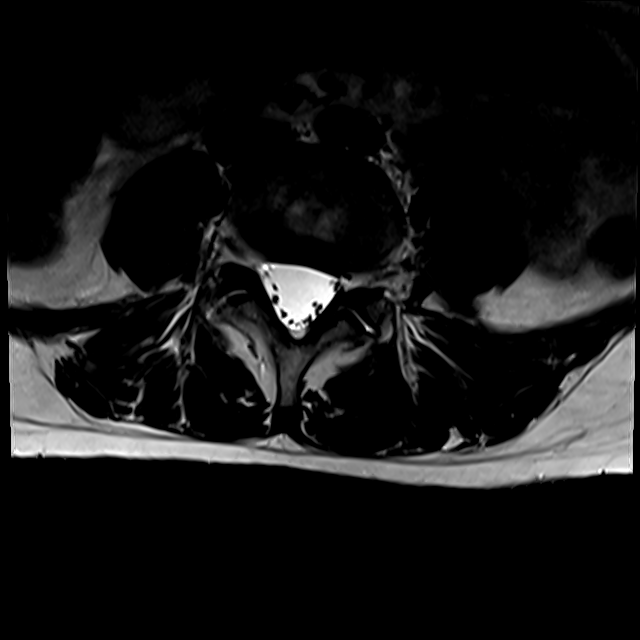
[im 18/41]
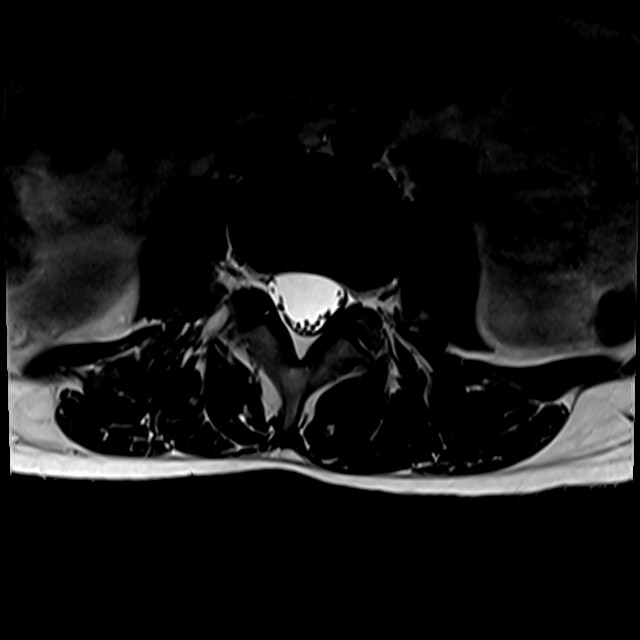
[im 21/41]
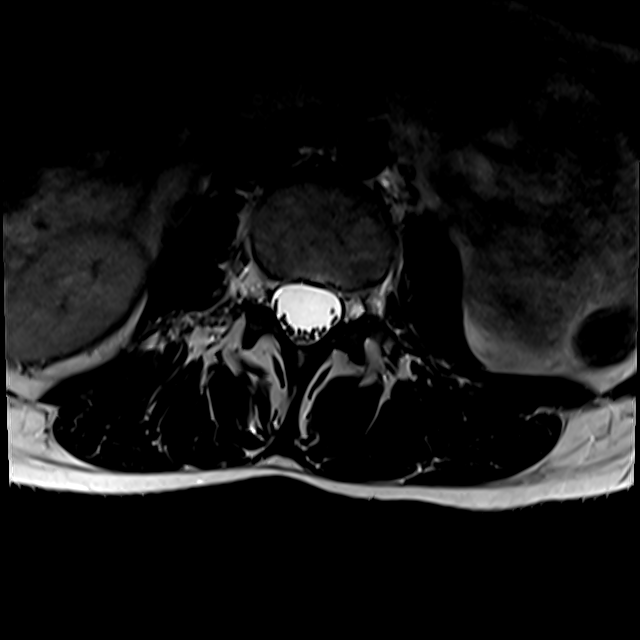
[im 23/41]
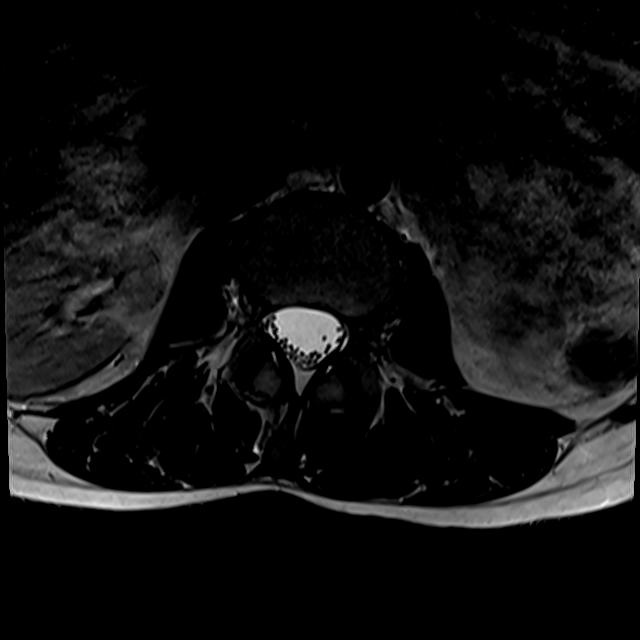
[im 35/41]
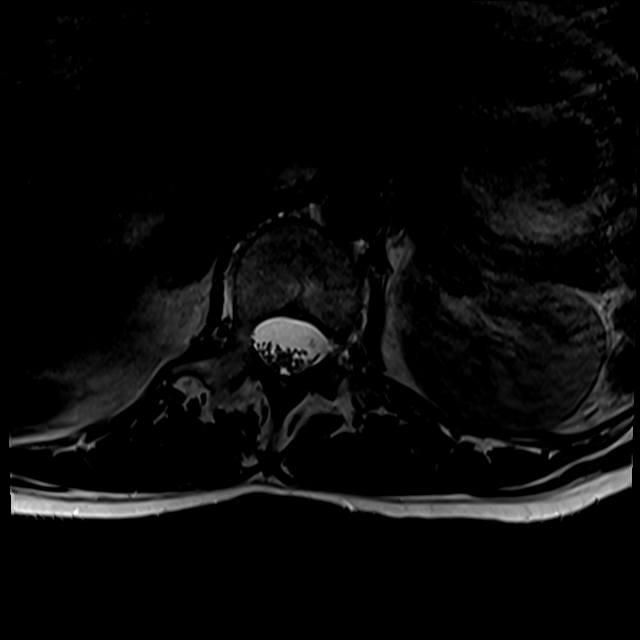

[19 of 48 positions shown; findings below may reference images not displayed]

FINDINGS: Segmentation:  Standard.

Alignment: There is convex left curvature with the apex at
approximately L2-3. No listhesis.

Vertebrae: No fracture, evidence of discitis, or bone lesion. No
pars interarticularis defect.

Conus medullaris and cauda equina: Conus extends to the L1-2 level.
Conus and cauda equina appear normal.

Paraspinal and other soft tissues: Negative.

Disc levels:

Disc height and hydration are maintained at all levels. The central
canal and foramina are widely patent throughout.
IMPRESSION: Convex left lumbar curvature as seen on prior plain films. The study
is otherwise negative. The central canal and foramina are widely
patent at all levels.

## 2019-05-02 ENCOUNTER — Encounter: Payer: Self-pay | Admitting: Family Medicine

## 2019-05-02 ENCOUNTER — Other Ambulatory Visit: Payer: Self-pay

## 2019-05-02 ENCOUNTER — Ambulatory Visit: Payer: PRIVATE HEALTH INSURANCE | Admitting: Family Medicine

## 2019-05-02 DIAGNOSIS — M533 Sacrococcygeal disorders, not elsewhere classified: Secondary | ICD-10-CM | POA: Diagnosis not present

## 2019-05-02 MED ORDER — VENLAFAXINE HCL ER 37.5 MG PO CP24: 37.5000 mg | ORAL_CAPSULE | Freq: Every day | ORAL | 0 refills | Status: DC

## 2019-05-02 NOTE — Assessment & Plan Note (Addendum)
Sacral pain is still seems to proportion.  Possible coccydynia.  We discussed with patient about the possibility of Effexor at this time.  See how patient responds to this.  Patient had muscle spasms with meloxicam.      will try  Medication decrease  Pressure Total time with patient and review 45 minutes

## 2019-05-02 NOTE — Progress Notes (Signed)
[place] Phone: [phone] Subjective:   Debbie [name], am serving as a scribe for Dr. Hulan Saas. This visit occurred during the SARS-CoV-2 public health emergency.  Safety protocols were in place, including screening questions prior to the visit, additional usage of staff PPE, and extensive cleaning of exam room while observing appropriate contact time as indicated for disinfecting solutions.   I'm seeing this patient by the request  of:  [name], NP  CC: Low back pain follow-up  [id number]   04/09/2019 Patient is now having some mild signs of lumbar radiculopathy.  Concern for an L5-S1 area.  Patient has had x-rays of the sacroiliac joint that was fairly unremarkable.  Lumbar x-rays ordered today but I do feel that advanced imaging is warranted with now pain being greater than 6 months that seems to be worsening and not responding to formal physical therapy, oral medications or even IM medications.  Toradol and Depo-Medrol given today again to try to help.  Discussed depending on the advanced imaging patient could be a candidate for epidurals if necessary.  Ruling out any type of cyst formation or occult fracture I think is also necessary before we continue with certain therapy.  Spent  25 minutes with patient face-to-face and had greater than 50% of counseling including as described above in assessment and plan.  Update 05/02/2019 Debbie [name] is a 34 y.o. female coming in with complaint of back pain. Patient is here for MRI results. Patient states that the injections from last visit did help reduce the numbness and tingling. Continues to have pain in sacrum. Did take prednisone which helped to minimize pain. Last dose was beginning of last week and pain increased once she finished medication. Patient was unable to make it through the weekend so went on another dose of prednisone. Pain was  suppressed but her pain came back after finishing.   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 The study is otherwise negative. The central canal and foramina are widely patent at all levels.     Past Medical History:  Diagnosis Date  . Abnormal heart rhythm   . History of chicken pox   . POTS (postural orthostatic tachycardia syndrome)    Past Surgical History:  Procedure Laterality Date  . TONSILLECTOMY AND ADENOIDECTOMY  1997  . WISDOM TOOTH EXTRACTION     all 4   Social History   Socioeconomic History  . Marital status: Single    Spouse name: Not on file  . Number of children: 0  . Years of education: Not on file  . Highest education level: Not on file  Occupational History  . Occupation: university professor  Tobacco Use  . Smoking status: Never Smoker  . Smokeless tobacco: Never Used  Substance and Sexual Activity  . Alcohol use: [name]  . Drug use: [name]  . Sexual activity: Yes    Birth control/protection: None  Other Topics Concern  . 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 Difficulty of Paying Living Expenses: Not on file  Food Insecurity:   . Worried About Charity fundraiser in the Last Year: Not on file  . Ran Out of Food  in the Last Year: Not on file  Transportation Needs:   . Lack of Transportation (Medical): Not on file  . Lack of Transportation (Non-Medical): Not on file  Physical Activity:   . Days of Exercise per Week: Not on file  . Minutes of Exercise per Session: Not on file  Stress:   . Feeling of Stress : Not on file  Social Connections:   . Frequency of Communication with Friends and Family: Not on file  . Frequency of Social Gatherings with Friends and Family: Not on file  . Attends Religious Services: Not on file  . Active  Member of Clubs or Organizations: Not on file  . Attends Banker Meetings: Not on file  . Marital Status: Not on file   Allergies  Allergen Reactions  . Gluten Meal    Family History  Problem Relation Age of Onset  . Osteoporosis Mother   . Hyperlipidemia Father   . Heart disease Father        CAD/defibrillator age 70  . Hypertension Father   . Diabetes Father        type 2  . Colon cancer Neg Hx   . Esophageal cancer Neg Hx   . Stomach cancer Neg Hx        Current Outpatient Medications (Analgesics):  .  meloxicam (MOBIC) 15 MG tablet, Take 1 tablet (15 mg total) by mouth daily.   Current Outpatient Medications (Other):  .  gabapentin (NEURONTIN) 100 MG capsule, Take 2 capsules (200 mg total) by mouth at bedtime. Marland Kitchen  venlafaxine XR (EFFEXOR XR) 37.5 MG 24 hr capsule, Take 1 capsule (37.5 mg total) by mouth daily with breakfast.   Reviewed prior external information including notes and imaging from  primary care provider As well as notes that were available from care everywhere and other healthcare systems.  Past medical history, social, surgical and family history all reviewed in electronic medical record.  [name] pertanent information unless stated regarding to the chief complaint.   Review of Systems:  [name] headache, visual changes, nausea, vomiting, diarrhea, constipation, dizziness, abdominal pain, skin rash, fevers, chills, night sweats, weight loss, swollen lymph nodes, body aches, joint swelling, chest pain, shortness of breath, mood changes. POSITIVE muscle aches  Objective  Blood pressure 110/72, pulse 62, height 5\' 6"  (1.676 m), weight 125 lb (56.7 kg), last menstrual period 04/08/2019, SpO2 99 %.   General: [name] apparent distress alert and oriented x3 mood and affect normal, dressed appropriately.  HEENT: Pupils equal, extraocular movements intact  Respiratory: Patient's speak in full sentences and does not appear short of breath  Cardiovascular: [name]  lower extremity edema, non tender, [name] erythema  Skin: Warm dry intact with [name] signs of infection or rash on extremities or on axial skeleton.  Abdomen: Soft nontender  Neuro: Cranial nerves II through XII are intact, neurovascularly intact in all extremities with 2+ DTRs and 2+ pulses.  Lymph: [name] lymphadenopathy of posterior or anterior cervical chain or axillae bilaterally.  Gait normal with good balance and coordination.  MSK:  Non tender with full range of motion and good stability and symmetric strength and tone of shoulders, elbows, wrist, hip, knee and ankles bilaterally.  Low back exam was fairly unremarkable today.  Patient was able to lay down flat without any significant discomfort.  Sit comfortably in the chair   Impression and Recommendations:     This case required medical decision making of moderate complexity. The above documentation has been reviewed and  is accurate and complete Debbie Pulley, DO       Note: This dictation was prepared with Dragon dictation along with smaller phrase technology. Any transcriptional errors that result from this process are unintentional.

## 2019-05-02 NOTE — Patient Instructions (Signed)
Effexor  Sacral doughnut Send message in 2 weeks See me in 4 weeks

## 2019-05-10 ENCOUNTER — Encounter: Payer: Self-pay | Admitting: Family Medicine

## 2019-05-10 ENCOUNTER — Ambulatory Visit: Payer: PRIVATE HEALTH INSURANCE | Admitting: Family Medicine

## 2019-05-10 MED ORDER — TIZANIDINE HCL 4 MG PO TABS: 4.0000 mg | ORAL_TABLET | Freq: Every evening | ORAL | 2 refills | Status: DC

## 2019-05-21 ENCOUNTER — Ambulatory Visit: Payer: PRIVATE HEALTH INSURANCE | Admitting: Family

## 2019-05-29 ENCOUNTER — Other Ambulatory Visit: Payer: Self-pay | Admitting: Family Medicine

## 2019-05-29 MED ORDER — VENLAFAXINE HCL ER 37.5 MG PO CP24: 37.5000 mg | ORAL_CAPSULE | Freq: Every day | ORAL | 0 refills | Status: DC

## 2019-06-04 ENCOUNTER — Other Ambulatory Visit: Payer: Self-pay

## 2019-06-04 ENCOUNTER — Encounter: Payer: Self-pay | Admitting: Family Medicine

## 2019-06-04 ENCOUNTER — Ambulatory Visit: Payer: PRIVATE HEALTH INSURANCE | Admitting: Family Medicine

## 2019-06-04 VITALS — BP 122/84 | HR 92 | Ht 66.0 in | Wt 123.0 lb

## 2019-06-04 DIAGNOSIS — M999 Biomechanical lesion, unspecified: Secondary | ICD-10-CM | POA: Diagnosis not present

## 2019-06-04 DIAGNOSIS — M533 Sacrococcygeal disorders, not elsewhere classified: Secondary | ICD-10-CM | POA: Insufficient documentation

## 2019-06-04 MED ORDER — VENLAFAXINE HCL ER 75 MG PO CP24: 75.0000 mg | ORAL_CAPSULE | Freq: Every day | ORAL | 0 refills | Status: DC

## 2019-06-04 NOTE — Assessment & Plan Note (Signed)
Continues to have pain, went back to osteopathic manipulation and will see if this makes improvement.  Chronic problem now with mild exacerbation symptoms.  No significant worsening with the Effexor so will increase to 75 mg to see if it will make improvement.  Follow-up again in 4 to 8 weeks.

## 2019-06-04 NOTE — Patient Instructions (Signed)
Increase effexor 75 mg Sit on memory foam pillow but dont be scared to sit Tried manipulation today See me in 4 weeks and we can discuss injection

## 2019-06-04 NOTE — Progress Notes (Signed)
[name] [address] [place] [address] Phone: [phone] Subjective:   Debbie [name], am serving as a scribe for Dr. Antoine Primas. This visit occurred during the SARS-CoV-2 public health emergency.  Safety protocols were in place, including screening questions prior to the visit, additional usage of staff PPE, and extensive cleaning of exam room while observing appropriate contact time as indicated for disinfecting solutions.   I'm seeing this patient by the request  of:  Sandford Craze, NP  CC: Low back pain follow-up  [id number]   05/02/2019 Sacral pain is still seems to proportion.  Possible coccydynia.  We discussed with patient about the possibility of Effexor at this time.  See how patient responds to this.  Patient had muscle spasms with meloxicam.      will try  Medication decrease  Pressure Total time with patient and review 45 minutes   Update 06/04/2019 Debbie [name] is a 34 y.o. female coming in with complaint of low back pain. Patient states that she is the same as last visit. No change in her pain after starting Effexor. Pain increases with sitting. Pain occurring in sacrum and glutes.      Past Medical History:  Diagnosis Date  . Abnormal heart rhythm   . History of chicken pox   . POTS (postural orthostatic tachycardia syndrome)    Past Surgical History:  Procedure Laterality Date  . TONSILLECTOMY AND ADENOIDECTOMY  1997  . WISDOM TOOTH EXTRACTION     all 4   Social History   Socioeconomic History  . Marital status: Single    Spouse name: Not on file  . Number of children: 0  . Years of education: Not on file  . Highest education level: Not on file  Occupational History  . Occupation: university professor  Tobacco Use  . Smoking status: Never Smoker  . Smokeless tobacco: Never Used  Substance and Sexual Activity  . Alcohol use: No  . Drug use: No  . Sexual activity: Yes    Birth  control/protection: None  Other Topics Concern  . 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 Difficulty of Paying Living Expenses: Not on file  Food Insecurity:   . Worried About Programme researcher, broadcasting/film/video in the Last Year: Not on file  . Ran Out of Food in the Last Year: Not on file  Transportation Needs:   . Lack of Transportation (Medical): Not on file  . Lack of Transportation (Non-Medical): Not on file  Physical Activity:   . Days of Exercise per Week: Not on file  . Minutes of Exercise per Session: Not on file  Stress:   . Feeling of Stress : Not on file  Social Connections:   . Frequency of Communication with Friends and Family: Not on file  . Frequency of Social Gatherings with Friends and Family: Not on file  . Attends Religious Services: Not on file  . Active Member of Clubs or Organizations: Not on file  . Attends Banker Meetings: Not on file  . Marital Status: Not on file   Allergies  Allergen Reactions  . Gluten Meal    Family History  Problem Relation Age of Onset  . Osteoporosis Mother   .  Hyperlipidemia Father   . Heart disease Father        CAD/defibrillator age 56  . Hypertension Father   . Diabetes Father        type 2  . Colon cancer Neg Hx   . Esophageal cancer Neg Hx   . Stomach cancer Neg Hx        Current Outpatient Medications (Analgesics):  .  meloxicam (MOBIC) 15 MG tablet, Take 1 tablet (15 mg total) by mouth daily.   Current Outpatient Medications (Other):  .  gabapentin (NEURONTIN) 100 MG capsule, Take 2 capsules (200 mg total) by mouth at bedtime. Marland Kitchen  venlafaxine XR (EFFEXOR XR) 37.5 MG 24 hr capsule, Take 1 capsule (37.5 mg total) by mouth daily with breakfast. .  venlafaxine XR (EFFEXOR XR) 75 MG 24 hr capsule, Take 1 capsule (75 mg total) by  mouth daily with breakfast.   Reviewed prior external information including notes and imaging from  primary care provider As well as notes that were available from care everywhere and other healthcare systems.  Past medical history, social, surgical and family history all reviewed in electronic medical record.  No pertanent information unless stated regarding to the chief complaint.   Review of Systems:  No headache, visual changes, nausea, vomiting, diarrhea, constipation, dizziness, abdominal pain, skin rash, fevers, chills, night sweats, weight loss, swollen lymph nodes, body aches, joint swelling, chest pain, shortness of breath, mood changes. POSITIVE muscle aches  Objective  Blood pressure 122/84, pulse 92, height 5\' 6"  (1.676 m), weight 123 lb (55.8 kg), SpO2 99 %.   General: No apparent distress alert and oriented x3 mood and affect normal, dressed appropriately.  HEENT: Pupils equal, extraocular movements intact  Respiratory: Patient's speak in full sentences and does not appear short of breath  Cardiovascular: No lower extremity edema, non tender, no erythema  Skin: Warm dry intact with no signs of infection or rash on extremities or on axial skeleton.  Abdomen: Soft nontender  Neuro: Cranial nerves II through XII are intact, neurovascularly intact in all extremities with 2+ DTRs and 2+ pulses.  Lymph: No lymphadenopathy of posterior or anterior cervical chain or axillae bilaterally.  Gait normal with good balance and coordination.  MSK:  Non tender with full range of motion and good stability and symmetric strength and tone of shoulders, elbows, wrist, hip, knee and ankles bilaterally.  Back exam does have loss of lordosis.  Patient is tender to palpation of the coccyx and mildly over the sacroiliac joint bilaterally right greater than left.  Mild positive Corky Sox.  No pain with extension of the back.  Osteopathic findings  C6 flexed rotated and side bent left T3 extended  rotated and side bent right inhaled third rib T9 extended rotated and side bent left L2 flexed rotated and side bent right Sacrum right on right    Impression and Recommendations:     This case required medical decision making of moderate complexity. The above documentation has been reviewed and is accurate and complete [name], DO       Note: This dictation was prepared with Dragon dictation along with smaller phrase technology. Any transcriptional errors that result from this process are unintentional.

## 2019-06-04 NOTE — Assessment & Plan Note (Signed)
Decision today to treat with OMT was based on Physical Exam  After verbal consent patient was treated with HVLA, ME, FPR techniques in cervical, thoracic, rib,  lumbar and sacral areas  Patient tolerated the procedure well with improvement in symptoms  Patient given exercises, stretches and lifestyle modifications  See medications in patient instructions if given  Patient will follow up in 4-8 weeks 

## 2019-06-20 ENCOUNTER — Other Ambulatory Visit: Payer: Self-pay | Admitting: Family Medicine

## 2019-06-20 NOTE — Telephone Encounter (Signed)
Left message for patient to call back to confirm she needs refill on 37.5 as we just increased medication to 75 mg.

## 2019-06-26 ENCOUNTER — Other Ambulatory Visit: Payer: Self-pay | Admitting: Family Medicine

## 2019-07-02 ENCOUNTER — Ambulatory Visit: Payer: PRIVATE HEALTH INSURANCE | Admitting: Family Medicine

## 2019-07-02 ENCOUNTER — Other Ambulatory Visit: Payer: Self-pay

## 2019-07-02 ENCOUNTER — Encounter: Payer: Self-pay | Admitting: Family Medicine

## 2019-07-02 VITALS — BP 110/72 | HR 82 | Ht 66.0 in | Wt 123.0 lb

## 2019-07-02 DIAGNOSIS — M999 Biomechanical lesion, unspecified: Secondary | ICD-10-CM | POA: Diagnosis not present

## 2019-07-02 DIAGNOSIS — M533 Sacrococcygeal disorders, not elsewhere classified: Secondary | ICD-10-CM | POA: Diagnosis not present

## 2019-07-02 NOTE — Patient Instructions (Signed)
See me again in 6 weeks 

## 2019-07-02 NOTE — Assessment & Plan Note (Signed)

## 2019-07-02 NOTE — Assessment & Plan Note (Signed)
Chronic problem : Mild but stable  interventions previously, including medication management: Patient is doing well with the Effexor can increase to 75 mg   Interventions this visit: Osteopathic manipulation, discussed medication management including the gabapentin slightly, meloxicam for breakthrough, and grossly Effexor. We discussed with patient the importance ergonomics, home exercises, icing regimen, and over-the-counter natural products.   Future considerations but will be based on evaluation and next visit: Increasing Effexor to 150    Return to clinic: 5 to 8 weeks

## 2019-07-02 NOTE — Progress Notes (Signed)
[facility] [place] Phone: [phone] Subjective:   Debbie [name], am serving as a scribe for Dr. Hulan Saas. This visit occurred during the SARS-CoV-2 public health emergency.  Safety protocols were in place, including screening questions prior to the visit, additional usage of staff PPE, and extensive cleaning of exam room while observing appropriate contact time as indicated for disinfecting solutions.   I'm seeing this patient by the request  of:  Debbie Alar, NP  CC: Low back pain follow-up  [id number]  Debbie [name] is a 34 y.o. female coming in with complaint of back pain. Last seen on 06/04/2019 for OMT. Patient states that she feels like she is having less pain. Sitting exacerbates her pain.  Patient states  The higher doses of the Effexor seems to be making improvement.  Stated that it started to work after about a week.  Doing relatively well.  Not looking forward though to a activity where she would have to sit for multiple hours.      Past Medical History:  Diagnosis Date  . Abnormal heart rhythm   . History of chicken pox   . POTS (postural orthostatic tachycardia syndrome)    Past Surgical History:  Procedure Laterality Date  . TONSILLECTOMY AND ADENOIDECTOMY  1997  . WISDOM TOOTH EXTRACTION     all 4   Social History   Socioeconomic History  . Marital status: Single    Spouse name: Not on file  . Number of children: 0  . Years of education: Not on file  . Highest education level: Not on file  Occupational History  . Occupation: university professor  Tobacco Use  . Smoking status: Never Smoker  . Smokeless tobacco: Never Used  Substance and Sexual Activity  . Alcohol use: [name]  . Drug use: [name]  . Sexual activity: Yes    Birth control/protection: None  Other Topics Concern  . 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 Difficulty of Paying Living Expenses:   Food Insecurity:   . Worried About Charity fundraiser in the Last Year:   . Arboriculturist in the Last Year:   Transportation Needs:   . Film/video editor (Medical):   Marland Kitchen Lack of Transportation (Non-Medical):   Physical Activity:   . Days of Exercise per Week:   . Minutes of Exercise per Session:   Stress:   . Feeling of Stress :   Social Connections:   . Frequency of Communication with Friends and Family:   . Frequency of Social Gatherings with Friends and Family:   . Attends Religious Services:   . Active Member of Clubs or Organizations:   . Attends Archivist Meetings:   Marland Kitchen Marital Status:    Allergies  Allergen Reactions  . Gluten Meal    Family History  Problem Relation Age of Onset  . Osteoporosis Mother   . Hyperlipidemia Father   . Heart disease Father        CAD/defibrillator age 60  . Hypertension Father   . Diabetes Father        type 2  . Colon cancer Neg Hx   . Esophageal cancer Neg Hx   .  Stomach cancer Neg Hx        Current Outpatient Medications (Analgesics):  .  meloxicam (MOBIC) 15 MG tablet, Take 1 tablet (15 mg total) by mouth daily.   Current Outpatient Medications (Other):  .  gabapentin (NEURONTIN) 100 MG capsule, Take 2 capsules (200 mg total) by mouth at bedtime. Marland Kitchen  venlafaxine XR (EFFEXOR XR) 37.5 MG 24 hr capsule, Take 1 capsule (37.5 mg total) by mouth daily with breakfast. .  venlafaxine XR (EFFEXOR-XR) 75 MG 24 hr capsule, TAKE 1 CAPSULE (75 MG TOTAL) BY MOUTH DAILY WITH BREAKFAST.   Reviewed prior external information including notes and imaging from  primary care provider As well as notes that were available from care everywhere and other healthcare systems.  Past medical history, social, surgical and family history all reviewed in electronic  medical record.  [name] pertanent information unless stated regarding to the chief complaint.   Review of Systems:  [name] headache, visual changes, nausea, vomiting, diarrhea, constipation, dizziness, abdominal pain, skin rash, fevers, chills, night sweats, weight loss, swollen lymph nodes, body aches, joint swelling, chest pain, shortness of breath, mood changes. POSITIVE muscle aches  Objective  Blood pressure 110/72, pulse 82, height 5\' 6"  (1.676 m), weight 123 lb (55.8 kg), SpO2 99 %.   General: [name] apparent distress alert and oriented x3 mood and affect normal, dressed appropriately.  HEENT: Pupils equal, extraocular movements intact  Respiratory: Patient's speak in full sentences and does not appear short of breath  Cardiovascular: [name] lower extremity edema, non tender, [name] erythema  Neuro: Cranial nerves II through XII are intact, neurovascularly intact in all extremities with 2+ DTRs and 2+ pulses.  Gait normal with good balance and coordination.  MSK:  Non tender with full range of motion and good stability and symmetric strength and tone of shoulders, elbows, wrist, hip, knee and ankles bilaterally.  Low back exam does have some mild loss of lordosis.  Tender to palpation still over the sacroiliac joint and the coccyx area.  Patient does have very mild tightness with test but improved.  Mild tightness of the left hip flexor.  Osteopathic findings C6 flexed rotated and side bent left T3 extended rotated and side bent right inhaled third rib T5 extended rotated and side bent left L1 flexed rotated and side bent left  Sacrum right on right    Impression and Recommendations:     This case required medical decision making of moderate complexity. The above documentation has been reviewed and is accurate and complete Pearlean Brownie, DO       Note: This dictation was prepared with Dragon dictation along with smaller phrase technology. Any transcriptional errors that result from this  process are unintentional.

## 2019-08-01 ENCOUNTER — Other Ambulatory Visit: Payer: Self-pay | Admitting: Family Medicine

## 2019-08-05 ENCOUNTER — Other Ambulatory Visit: Payer: Self-pay

## 2019-08-05 ENCOUNTER — Encounter: Payer: Self-pay | Admitting: Family Medicine

## 2019-08-05 ENCOUNTER — Ambulatory Visit: Payer: PRIVATE HEALTH INSURANCE | Admitting: Family Medicine

## 2019-08-05 ENCOUNTER — Other Ambulatory Visit (HOSPITAL_COMMUNITY)
Admission: RE | Admit: 2019-08-05 | Discharge: 2019-08-05 | Disposition: A | Discharge status: Home / Self Care | Payer: PRIVATE HEALTH INSURANCE | Source: Ambulatory Visit | Attending: Family Medicine | Admitting: Family Medicine

## 2019-08-05 VITALS — BP 110/80 | HR 67 | Temp 97.6°F | Resp 16 | Ht 66.0 in | Wt 116.0 lb

## 2019-08-05 DIAGNOSIS — N898 Other specified noninflammatory disorders of vagina: Secondary | ICD-10-CM | POA: Diagnosis not present

## 2019-08-05 DIAGNOSIS — L292 Pruritus vulvae: Secondary | ICD-10-CM | POA: Diagnosis not present

## 2019-08-05 DIAGNOSIS — Z124 Encounter for screening for malignant neoplasm of cervix: Secondary | ICD-10-CM | POA: Insufficient documentation

## 2019-08-05 MED ORDER — FLUCONAZOLE 150 MG PO TABS: 150.0000 mg | ORAL_TABLET | Freq: Once | ORAL | 0 refills | Status: AC

## 2019-08-05 NOTE — Patient Instructions (Addendum)
It was nice to see you today-I will be in touch with your labs as soon as possible  For the time being, we will treat you for presumed yeast infection with Diflucan by mouth.  Take 1 dose, repeat in 1 week if needed  You can also try Gynecort topically for any itching in the vulvar area Please let me know if you are getting worse or have any questions while labs are pending

## 2019-08-05 NOTE — Progress Notes (Addendum)
[facility] [address] [place], [place] [address] [phone]  Date:  08/05/2019   Name:  Debbie [name]   DOB:  [birth year]   MRN:  [id number]  PCP:  Sandford Craze, NP    Chief Complaint: Vaginal Pain (itching, vaginal and anal itching, off and on for months)   History of Present Illness:  Debbie [name] is a 34 y.o. very pleasant female patient who presents with the following:  Patient with history of POTS, here today with a vaginal concern Her primary care provider is Sandford Craze I have not seen her previously  Most recent Pap 2017-this was normal, can update for her today  She has taken a few medications recently for MSK issues She is using gabapentin and effexor per Dr [name] which does seem to be helping She has noted perineal itching and irritation for about 2 months Will wax and wane No unusual discharge No urinary sx  LMP was 07/13/19  No evidence of pinworms in her stool, she does not have exposure to any new or unhealthy animals No new sexual partners or concern of STI  No rectal bleeding or painful BM  Patient Active Problem List   Diagnosis Date Noted  . Coccydynia 06/04/2019  . Lumbar radiculopathy 04/09/2019  . 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 Nonallopathic lesion of sacral region 04/03/2019  . Nonallopathic lesion of lumbosacral region 04/03/2019  . Nonallopathic lesion of thoracic region 04/03/2019  . Nonallopathic lesion of cervical region 04/03/2019  . Nonallopathic lesion of rib cage 04/03/2019  . Atypical squamous cells of undetermined significance (ASCUS) on Papanicolaou smear of cervix 01/17/2014  . Routine general medical examination at a health care facility 01/28/2013  . POTS (postural orthostatic tachycardia syndrome) 01/28/2013    Past Medical History:  Diagnosis Date  . Abnormal heart rhythm   . History of chicken pox   . POTS  (postural orthostatic tachycardia syndrome)     Past Surgical History:  Procedure Laterality Date  . TONSILLECTOMY AND ADENOIDECTOMY  1997  . WISDOM TOOTH EXTRACTION     all 4    Social History   Tobacco Use  . Smoking status: Never Smoker  . Smokeless tobacco: Never Used  Substance Use Topics  . Alcohol use: No  . Drug use: No    Family History  Problem Relation Age of Onset  . Osteoporosis Mother   . Hyperlipidemia Father   . Heart disease Father        CAD/defibrillator age 101  . Hypertension Father   . Diabetes Father        type 2  . Colon cancer Neg Hx   . Esophageal cancer Neg Hx   . Stomach cancer Neg Hx     Allergies  Allergen Reactions  . Gluten Meal     Medication list has been reviewed and updated.  Current Outpatient Medications on File Prior to Visit  Medication Sig Dispense Refill  . venlafaxine XR (EFFEXOR-XR) 75 MG 24 hr capsule TAKE 1 CAPSULE (75 MG TOTAL) BY MOUTH DAILY WITH BREAKFAST. 90 capsule 1  . gabapentin (NEURONTIN) 100 MG capsule Take 2 capsules (200 mg total) by mouth at bedtime. (Patient not taking: Reported on 08/05/2019) 180 capsule 0   No current facility-administered medications on file prior to visit.    Review of Systems:  As per HPI- otherwise negative.   Physical Examination: Vitals:   08/05/19 1434  BP: Marland Kitchen)  141/98  Pulse: 67  Resp: 16  Temp: 97.6 F (36.4 C)  SpO2: 98%   Vitals:   08/05/19 1434  Weight: 116 lb (52.6 kg)  Height: 5\' 6"  (1.676 m)   Body mass index is 18.72 kg/m. Ideal Body Weight: Weight in (lb) to have BMI = 25: 154.6  GEN: no acute distress.  Slender build, looks well HEENT: Atraumatic, Normocephalic.  Ears and Nose: No external deformity. CV: RRR, No M/G/R. No JVD. No thrill. No extra heart sounds. PULM: CTA B, no wheezes, crackles, rhonchi. No retractions. No resp. distress. No accessory muscle use. ABD: S, NT, ND, +BS. No rebound. No HSM. EXTR: No c/c/e PSYCH: Normally interactive.  Conversant.  Normal vulva, vagina, cervix, adnexa.  No inflammation or redness is seen in the perineal area   Assessment and Plan: Vaginal itching - Plan: Cervicovaginal ancillary only( [facility]), fluconazole (DIFLUCAN) 150 MG tablet  Vulvar itching - Plan: Cervicovaginal ancillary only( [place])  Screening for cervical cancer - Plan: Cytology - PAP  Patient here today with concern of vulvar and perineal itching for couple of months.  This has waxed and waned Normal appearance on exam today Lab evaluation pending as above We will treat with Diflucan for presumed yeast infection Moderate medical decision making today  Moderate medical decision making today  This visit occurred during the SARS-CoV-2 public health emergency.  Safety protocols were in place, including screening questions prior to the visit, additional usage of staff PPE, and extensive cleaning of exam room while observing appropriate contact time as indicated for disinfecting solutions.    Signed Lamar Blinks, MD  Addendum 5/5, received her vaginal swab as below, message to patient  Results for orders placed or performed in visit on 08/05/19  Cervicovaginal ancillary only( [place])  Result Value Ref Range   Neisseria Gonorrhea Negative    Chlamydia Negative    Trichomonas Negative    Bacterial Vaginitis (gardnerella) Negative    Candida Vaginitis Positive (A)    Candida Glabrata Negative    Comment      Normal Reference Range Bacterial Vaginosis - Negative   Comment Normal Reference Range Candida Species - Negative    Comment Normal Reference Range Candida Galbrata - Negative    Comment Normal Reference Range Trichomonas - Negative    Comment Normal Reference [place] Chlamydia - Negative    Comment      Normal Reference Range Neisseria Gonorrhea - Negative

## 2019-08-07 ENCOUNTER — Encounter: Payer: Self-pay | Admitting: Family Medicine

## 2019-08-07 LAB — CERVICOVAGINAL ANCILLARY ONLY
Bacterial Vaginitis (gardnerella): NEGATIVE
Candida Glabrata: NEGATIVE
Candida Vaginitis: POSITIVE — AB
Chlamydia: NEGATIVE
Comment: NEGATIVE
Comment: NEGATIVE
Comment: NEGATIVE
Comment: NEGATIVE
Comment: NEGATIVE
Comment: NORMAL
Neisseria Gonorrhea: NEGATIVE
Trichomonas: NEGATIVE

## 2019-08-07 LAB — CYTOLOGY - PAP
Comment: NEGATIVE
Diagnosis: NEGATIVE
High risk HPV: NEGATIVE

## 2019-08-14 ENCOUNTER — Other Ambulatory Visit: Payer: Self-pay

## 2019-08-14 ENCOUNTER — Encounter: Payer: Self-pay | Admitting: Family Medicine

## 2019-08-14 ENCOUNTER — Ambulatory Visit: Payer: PRIVATE HEALTH INSURANCE | Admitting: Family Medicine

## 2019-08-14 VITALS — BP 114/72 | HR 72 | Ht 66.0 in | Wt 121.0 lb

## 2019-08-14 DIAGNOSIS — M533 Sacrococcygeal disorders, not elsewhere classified: Secondary | ICD-10-CM

## 2019-08-14 DIAGNOSIS — M999 Biomechanical lesion, unspecified: Secondary | ICD-10-CM | POA: Diagnosis not present

## 2019-08-14 NOTE — Patient Instructions (Signed)
Doing great Continue medication Ok to start working out but start slowly Ok to visit family See me in 7-8 weeks

## 2019-08-14 NOTE — Progress Notes (Signed)
Debbie [name] Debbie [name] Phone: [phone] Subjective:    I'm seeing this patient by the request  of:  Debbie Alar, NP  CC:  Low back pain   [id number]  Debbie [name] is a 34 y.o. female coming in with complaint of back pain. Last seen on 07/02/2019 for OMT. Patient states that she has been doing great since last visit. No issues with SI joint. Overall doing well with minimal pain.       Past Medical History:  Diagnosis Date  . Abnormal heart rhythm   . History of chicken pox   . POTS (postural orthostatic tachycardia syndrome)    Past Surgical History:  Procedure Laterality Date  . TONSILLECTOMY AND ADENOIDECTOMY  1997  . WISDOM TOOTH EXTRACTION     all 4   Social History   Socioeconomic History  . Marital status: Single    Spouse name: Not on file  . Number of children: 0  . Years of education: Not on file  . Highest education level: Not on file  Occupational History  . Occupation: university professor  Tobacco Use  . Smoking status: Never Smoker  . Smokeless tobacco: Never Used  Substance and Sexual Activity  . Alcohol use: No  . Drug use: No  . Sexual activity: Yes    Birth control/protection: None  Other Topics Concern  . 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 Difficulty of Paying Living Expenses:   Food Insecurity:   . Worried About Charity fundraiser in the Last Year:   . Arboriculturist in the Last Year:   Transportation Needs:   . Film/video editor (Medical):   Marland Kitchen Lack of Transportation (Non-Medical):   Physical Activity:   . Days of Exercise per Week:   . Minutes of Exercise per Session:   Stress:   . Feeling of Stress :   Social Connections:   . Frequency of Communication with  Friends and Family:   . Frequency of Social Gatherings with Friends and Family:   . Attends Religious Services:   . Active Member of Clubs or Organizations:   . Attends Archivist Meetings:   Marland Kitchen Marital Status:    Allergies  Allergen Reactions  . Gluten Meal    Family History  Problem Relation Age of Onset  . Osteoporosis Mother   . Hyperlipidemia Father   . Heart disease Father        CAD/defibrillator age 106  . Hypertension Father   . Diabetes Father        type 2  . Colon cancer Neg Hx   . Esophageal cancer Neg Hx   . Stomach cancer Neg Hx          Current Outpatient Medications (Other):  .  gabapentin (NEURONTIN) 100 MG capsule, Take 2 capsules (200 mg total) by mouth at bedtime. Marland Kitchen  venlafaxine XR (EFFEXOR-XR) 75 MG 24 hr capsule, TAKE 1 CAPSULE (75 MG TOTAL) BY MOUTH DAILY WITH BREAKFAST.   Reviewed prior external information including notes and imaging from  primary care provider As well as notes that were available from care everywhere and other healthcare systems.  Past medical history, social, surgical and family history  all reviewed in electronic medical record.  No pertanent information unless stated regarding to the chief complaint.   Review of Systems:  No headache, visual changes, nausea, vomiting, diarrhea, constipation, dizziness, abdominal pain, skin rash, fevers, chills, night sweats, weight loss, swollen lymph nodes, body aches, joint swelling, chest pain, shortness of breath, mood changes. POSITIVE muscle aches  Objective  Blood pressure 114/72, pulse 72, height 5\' 6"  (1.676 m), weight 121 lb (54.9 kg), SpO2 98 %.   General: No apparent distress alert and oriented x3 mood and affect normal, dressed appropriately.  HEENT: Pupils equal, extraocular movements intact  Respiratory: Patient's speak in full sentences and does not appear short of breath  Cardiovascular: No lower extremity edema, non tender, no erythema  Neuro: Cranial nerves II  through XII are intact, neurovascularly intact in all extremities with 2+ DTRs and 2+ pulses.  Gait normal with good balance and coordination.  MSK:  Non tender with full range of motion and good stability and symmetric strength and tone of shoulders, elbows, wrist, hip, knee and ankles bilaterally.  Back pain TTP over SI joint bilateral. improvement range of motion.   Osteopathic findings C2 flexed rotated and side bent right C7 flexed rotated and side bent left T5 extended rotated and side bent right inhaled rib T9 extended rotated and side bent left L2 flexed rotated and side bent right Sacrum right on right    Impression and Recommendations:     This case required medical decision making of moderate complexity. The above documentation has been reviewed and is accurate and complete , DO       Note: This dictation was prepared with Dragon dictation along with smaller phrase technology. Any transcriptional errors that result from this process are unintentional.

## 2019-08-14 NOTE — Assessment & Plan Note (Signed)

## 2019-08-14 NOTE — Assessment & Plan Note (Signed)
Low back pain that is chronic.  Mild exacerbation.  Patient has responded fairly well to the gabapentin and the Effexor.  We discussed these medications in great detail.  Discussed core strengthening, discussed avoiding direct impact in the area.  Responding well to manipulation.  Follow-up again in 6 to 8 weeks

## 2019-08-19 ENCOUNTER — Ambulatory Visit: Payer: PRIVATE HEALTH INSURANCE | Admitting: Family

## 2019-08-19 ENCOUNTER — Other Ambulatory Visit: Payer: Self-pay

## 2019-08-19 ENCOUNTER — Encounter: Payer: Self-pay | Admitting: Family

## 2019-08-19 VITALS — BP 129/87 | HR 77 | Temp 97.6°F | Resp 16 | Wt 119.0 lb

## 2019-08-19 DIAGNOSIS — N760 Acute vaginitis: Secondary | ICD-10-CM

## 2019-08-19 MED ORDER — FLUCONAZOLE 150 MG PO TABS: ORAL_TABLET | ORAL | 0 refills | Status: DC

## 2019-08-19 NOTE — Patient Instructions (Signed)
Please begin diflucan. Call if symptoms worsen or if symptoms do not improve in 1 week.

## 2019-08-19 NOTE — Progress Notes (Signed)
Subjective:    Patient ID: Debbie [name], female    DOB: [birth year], 34 y.o.   MRN: [id number]  HPI   Patient is a 34 yr old female who presents today to discuss ongoing vaginal discomfort.  She saw Jessica Copland on 5/5 and vaginal testing revealed yeast. She was treated with diflucan. Notes that she had little improvement after the first dose of diflucan.  Took second dose 3 days later and symptoms improved.  Today she feels like "symptoms are trying to come back."   Coccydynia- She continues to work with sports medicine and is currently being treated with effexor. Notes a significant improvement in her symptoms.   Review of Systems    see HPI  Past Medical History:  Diagnosis Date  . Abnormal heart rhythm   . History of chicken pox   . POTS (postural orthostatic tachycardia syndrome)      Social History   Socioeconomic History  . Marital status: Single    Spouse name: Not on file  . Number of children: 0  . Years of education: Not on file  . Highest education level: Not on file  Occupational History  . Occupation: university professor  Tobacco Use  . Smoking status: Never Smoker  . Smokeless tobacco: Never Used  Substance and Sexual Activity  . Alcohol use: No  . Drug use: No  . Sexual activity: Yes    Birth control/protection: None  Other Topics Concern  . 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 Difficulty of Paying Living Expenses:   Food Insecurity:   . Worried About Charity fundraiser in the Last Year:   . Arboriculturist in the Last Year:   Transportation Needs:   . Film/video editor (Medical):   Marland Kitchen Lack of Transportation (Non-Medical):   Physical Activity:   . Days of Exercise per Week:   . Minutes of Exercise per Session:   Stress:   . Feeling of Stress :    Social Connections:   . Frequency of Communication with Friends and Family:   . Frequency of Social Gatherings with Friends and Family:   . Attends Religious Services:   . Active Member of Clubs or Organizations:   . Attends Archivist Meetings:   Marland Kitchen Marital Status:   Intimate Partner Violence:   . Fear of Current or Ex-Partner:   . Emotionally Abused:   Marland Kitchen Physically Abused:   . Sexually Abused:     Past Surgical History:  Procedure Laterality Date  . TONSILLECTOMY AND ADENOIDECTOMY  1997  . WISDOM TOOTH EXTRACTION     all 4    Family History  Problem Relation Age of Onset  . Osteoporosis Mother   . Hyperlipidemia Father   . Heart disease Father        CAD/defibrillator age 4  . Hypertension Father   . Diabetes Father        type 2  . Colon cancer Neg Hx   . Esophageal cancer Neg Hx   . Stomach cancer Neg Hx     Allergies  Allergen Reactions  . Gluten Meal     Current Outpatient Medications on File Prior to Visit  Medication Sig Dispense Refill  . venlafaxine XR (EFFEXOR-XR)  75 MG 24 hr capsule TAKE 1 CAPSULE (75 MG TOTAL) BY MOUTH DAILY WITH BREAKFAST. 90 capsule 1  . gabapentin (NEURONTIN) 100 MG capsule Take 2 capsules (200 mg total) by mouth at bedtime. (Patient not taking: Reported on 08/19/2019) 180 capsule 0   No current facility-administered medications on file prior to visit.    BP 129/87 (BP Location: Right Arm, Patient Position: Sitting, Cuff Size: Small)   Pulse 77   Temp 97.6 F (36.4 C) (Temporal)   Resp 16   Wt 119 lb (54 kg)   SpO2 100%   BMI 19.21 kg/m    Objective:   Physical Exam Constitutional:      Appearance: She is well-developed.  Neck:     Thyroid: No thyromegaly.  Cardiovascular:     Rate and Rhythm: Normal rate and regular rhythm.     Heart sounds: Normal heart sounds. No murmur.  Pulmonary:     Effort: Pulmonary effort is normal. No respiratory distress.     Breath sounds: Normal breath sounds. No wheezing.    Musculoskeletal:     Cervical back: Neck supple.  Skin:    General: Skin is warm and dry.  Neurological:     Mental Status: She is alert and oriented to person, place, and time.  Psychiatric:        Behavior: Behavior normal.        Thought Content: Thought content normal.        Judgment: Judgment normal.           Assessment & Plan:  Vaginitis- will repeat diflucan. Pt will let me know if symptoms worsen or if symptoms fail to improve.  This visit occurred during the SARS-CoV-2 public health emergency.  Safety protocols were in place, including screening questions prior to the visit, additional usage of staff PPE, and extensive cleaning of exam room while observing appropriate contact time as indicated for disinfecting solutions.

## 2019-08-21 NOTE — Addendum Note (Signed)
Addended by: Sandford Craze on: 08/21/2019 07:10 AM   Modules accepted: Level of Service

## 2019-09-09 ENCOUNTER — Encounter: Payer: Self-pay | Admitting: Family

## 2019-09-09 ENCOUNTER — Ambulatory Visit: Payer: PRIVATE HEALTH INSURANCE | Admitting: Family

## 2019-09-09 ENCOUNTER — Other Ambulatory Visit: Payer: Self-pay

## 2019-09-09 VITALS — BP 138/95 | HR 75 | Temp 98.4°F | Resp 16 | Ht 66.0 in | Wt 121.0 lb

## 2019-09-09 DIAGNOSIS — B373 Candidiasis of vulva and vagina: Secondary | ICD-10-CM | POA: Diagnosis not present

## 2019-09-09 DIAGNOSIS — B3731 Acute candidiasis of vulva and vagina: Secondary | ICD-10-CM

## 2019-09-09 MED ORDER — FLUCONAZOLE 150 MG PO TABS: ORAL_TABLET | ORAL | 0 refills | Status: DC

## 2019-09-09 MED ORDER — CVS CLOTRIMAZOLE 3 2 % VA CREA: 1.0000 | TOPICAL_CREAM | VAGINAL | 0 refills | Status: DC

## 2019-09-09 NOTE — Progress Notes (Signed)
Subjective:    Patient ID: Debbie [name], female    DOB: 12/04/1985, 34 y.o.   MRN: [id number]  HPI  Patient is a 34 yr old female who presents today to discuss recurrent vaginal itching. Last visit she noted the same symptoms and we treated her with diflucan. (vaginal swab + candida).  She reports that symptoms improved with diflucan but never completely resolved. She then did an OTC treatment with monistat which helped when she was using then immediately returned when she discontinued treatment. She also has itching in the rectal area.   Review of Systems See HPI  Past Medical History:  Diagnosis Date  . Abnormal heart rhythm   . History of chicken pox   . POTS (postural orthostatic tachycardia syndrome)      Social History   Socioeconomic History  . Marital status: Single    Spouse name: Not on file  . Number of children: 0  . Years of education: Not on file  . Highest education level: Not on file  Occupational History  . Occupation: university professor  Tobacco Use  . Smoking status: Never Smoker  . Smokeless tobacco: Never Used  Substance and Sexual Activity  . Alcohol use: No  . Drug use: No  . Sexual activity: Yes    Birth control/protection: None  Other Topics Concern  . 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 Difficulty of Paying Living Expenses:   Food Insecurity:   . Worried About Programme researcher, broadcasting/film/video in the Last Year:   . Barista in the Last Year:   Transportation Needs:   . Freight forwarder (Medical):   Marland Kitchen Lack of Transportation (Non-Medical):   Physical Activity:   . Days of Exercise per Week:   . Minutes of Exercise per Session:   Stress:   . Feeling of Stress :   Social Connections:   . Frequency of Communication with Friends and Family:   .  Frequency of Social Gatherings with Friends and Family:   . Attends Religious Services:   . Active Member of Clubs or Organizations:   . Attends Banker Meetings:   Marland Kitchen Marital Status:   Intimate Partner Violence:   . Fear of Current or Ex-Partner:   . Emotionally Abused:   Marland Kitchen Physically Abused:   . Sexually Abused:     Past Surgical History:  Procedure Laterality Date  . TONSILLECTOMY AND ADENOIDECTOMY  1997  . WISDOM TOOTH EXTRACTION     all 4    Family History  Problem Relation Age of Onset  . Osteoporosis Mother   . Hyperlipidemia Father   . Heart disease Father        CAD/defibrillator age 43  . Hypertension Father   . Diabetes Father        type 2  . Colon cancer Neg Hx   . Esophageal cancer Neg Hx   . Stomach cancer Neg Hx     Allergies  Allergen Reactions  . Gluten Meal     Current Outpatient Medications on File Prior to Visit  Medication Sig Dispense Refill  . venlafaxine XR (EFFEXOR-XR) 75 MG 24 hr capsule TAKE 1 CAPSULE (75 MG TOTAL) BY MOUTH DAILY WITH BREAKFAST. 90 capsule 1  No current facility-administered medications on file prior to visit.    BP (!) 138/95 (BP Location: Right Arm, Patient Position: Sitting, Cuff Size: Small)   Pulse 75   Temp 98.4 F (36.9 C) (Temporal)   Resp 16   Ht 5\' 6"  (1.676 m)   Wt 121 lb (54.9 kg)   SpO2 100%   BMI 19.53 kg/m       Objective:   Physical Exam Constitutional:      Appearance: She is well-developed.  Cardiovascular:     Rate and Rhythm: Normal rate and regular rhythm.     Heart sounds: Normal heart sounds. No murmur.  Pulmonary:     Effort: Pulmonary effort is normal. No respiratory distress.     Breath sounds: Normal breath sounds. No wheezing.  Psychiatric:        Behavior: Behavior normal.        Thought Content: Thought content normal.        Judgment: Judgment normal.           Assessment & Plan:  Vaginal candidiasis-  Uncontrolled.   Advised pt as  follows:  Restart diflucan. Use vaginal clotrimazole twice weekly x 6 months. Call if new/worsening symptoms or if symptoms fail to improve. Add a daily probiotic.   This visit occurred during the SARS-CoV-2 public health emergency.  Safety protocols were in place, including screening questions prior to the visit, additional usage of staff PPE, and extensive cleaning of exam room while observing appropriate contact time as indicated for disinfecting solutions.

## 2019-09-09 NOTE — Patient Instructions (Signed)
Restart diflucan. Use vaginal clotrimazole twice weekly x 6 months. Call if new/worsening symptoms or if symptoms fail to improve. Add a daily probiotic.

## 2019-09-12 ENCOUNTER — Encounter: Payer: Self-pay | Admitting: Family Medicine

## 2019-09-12 MED ORDER — VENLAFAXINE HCL ER 150 MG PO CP24
150.0000 mg | ORAL_CAPSULE | Freq: Every day | ORAL | 3 refills | Status: DC
Start: 2019-09-12 — End: 2019-10-08

## 2019-09-30 ENCOUNTER — Other Ambulatory Visit (HOSPITAL_COMMUNITY)
Admission: RE | Admit: 2019-09-30 | Discharge: 2019-09-30 | Disposition: A | Discharge status: Home / Self Care | Payer: PRIVATE HEALTH INSURANCE | Source: Ambulatory Visit | Attending: Family Medicine | Admitting: Family Medicine

## 2019-09-30 ENCOUNTER — Ambulatory Visit: Payer: PRIVATE HEALTH INSURANCE | Admitting: Family Medicine

## 2019-09-30 ENCOUNTER — Encounter: Payer: Self-pay | Admitting: Family Medicine

## 2019-09-30 ENCOUNTER — Other Ambulatory Visit: Payer: Self-pay

## 2019-09-30 VITALS — BP 98/78 | HR 107 | Temp 97.6°F | Resp 18 | Ht 66.0 in | Wt 120.0 lb

## 2019-09-30 DIAGNOSIS — B373 Candidiasis of vulva and vagina: Secondary | ICD-10-CM | POA: Insufficient documentation

## 2019-09-30 DIAGNOSIS — N760 Acute vaginitis: Secondary | ICD-10-CM | POA: Insufficient documentation

## 2019-09-30 DIAGNOSIS — Z833 Family history of diabetes mellitus: Secondary | ICD-10-CM | POA: Insufficient documentation

## 2019-09-30 DIAGNOSIS — B3731 Acute candidiasis of vulva and vagina: Secondary | ICD-10-CM

## 2019-09-30 LAB — POC URINALSYSI DIPSTICK (AUTOMATED)
Bilirubin, UA: NEGATIVE
Blood, UA: NEGATIVE
Glucose, UA: POSITIVE — AB
Ketones, UA: NEGATIVE
Leukocytes, UA: NEGATIVE
Nitrite, UA: NEGATIVE
Protein, UA: NEGATIVE
Spec Grav, UA: >=1.03 — AB (ref 1.010–1.025)
Urobilinogen, UA: 0.2 E.U./dL
pH, UA: 5.5 (ref 5.0–8.0)

## 2019-09-30 MED ORDER — FLUCONAZOLE 150 MG PO TABS: ORAL_TABLET | ORAL | 0 refills | Status: DC

## 2019-09-30 NOTE — Progress Notes (Signed)
Patient ID: Debbie [name], female    DOB: 09/23/1985  Age: 34 y.o. MRN: [id number]    Subjective:  Subjective  HPI Debbie [name] presents for recurrent vaginitis --- the diflucan helps for a few days but it comes right back.  No d/c , no odor Just vaginal itching   Review of Systems  Constitutional: Negative for appetite change, diaphoresis, fatigue and unexpected weight change.  Eyes: Negative for pain, redness and visual disturbance.  Respiratory: Negative for cough, chest tightness, shortness of breath and wheezing.   Cardiovascular: Negative for chest pain, palpitations and leg swelling.  Endocrine: Negative for cold intolerance, heat intolerance, polydipsia, polyphagia and polyuria.  Genitourinary: Negative for difficulty urinating, dysuria and frequency.       Vaginal itching  Neurological: Negative for dizziness, light-headedness, numbness and headaches.    History Past Medical History:  Diagnosis Date  . Abnormal heart rhythm   . History of chicken pox   . POTS (postural orthostatic tachycardia syndrome)     She has a past surgical history that includes Tonsillectomy and adenoidectomy (1997) and Wisdom tooth extraction.   Her family history includes Diabetes in her father; Heart disease in her father; Hyperlipidemia in her father; Hypertension in her father; Osteoporosis in her mother.She reports that she has never smoked. She has never used smokeless tobacco. She reports that she does not drink alcohol and does not use drugs.  Current Outpatient Medications on File Prior to Visit  Medication Sig Dispense Refill  . venlafaxine XR (EFFEXOR XR) 150 MG 24 hr capsule Take 1 capsule (150 mg total) by mouth daily with breakfast. 30 capsule 3   No current facility-administered medications on file prior to visit.     Objective:  Objective  Physical Exam Vitals and nursing note reviewed.  Constitutional:      Appearance: She is well-developed.  HENT:      Head: Normocephalic and atraumatic.  Eyes:     Conjunctiva/sclera: Conjunctivae normal.  Neck:     Thyroid: No thyromegaly.     Vascular: No carotid bruit or JVD.  Cardiovascular:     Rate and Rhythm: Normal rate and regular rhythm.     Heart sounds: Normal heart sounds. No murmur heard.   Pulmonary:     Effort: Pulmonary effort is normal. No respiratory distress.     Breath sounds: Normal breath sounds. No wheezing or rales.  Chest:     Chest wall: No tenderness.  Musculoskeletal:     Cervical back: Normal range of motion and neck supple.  Neurological:     Mental Status: She is alert and oriented to person, place, and time.    BP 98/78 (BP Location: Left Arm, Patient Position: Sitting, Cuff Size: Normal)   Pulse (!) 107   Temp 97.6 F (36.4 C) (Temporal)   Resp 18   Ht 5\' 6"  (1.676 m)   Wt 120 lb (54.4 kg)   SpO2 99%   BMI 19.37 kg/m  Wt Readings from Last 3 Encounters:  09/30/19 120 lb (54.4 kg)  09/09/19 121 lb (54.9 kg)  08/19/19 119 lb (54 kg)     Lab Results  Component Value Date   WBC 6.0 04/03/2019   HGB 14.5 04/03/2019   HCT 43.0 04/03/2019   PLT 206.0 04/03/2019   GLUCOSE 86 04/03/2019   CHOL 221 (H) 11/02/2015   TRIG 67.0 11/02/2015   HDL 88.40 11/02/2015   LDLCALC 120 (H) 11/02/2015   ALT 14 04/03/2019   AST 19 04/03/2019  NA 138 04/03/2019   K 4.0 04/03/2019   CL 104 04/03/2019   CREATININE 0.70 04/03/2019   BUN 22 04/03/2019   CO2 25 04/03/2019   TSH 1.55 04/03/2019    No results found.   Assessment & Plan:  Plan  I have discontinued Debbie [name]'s fluconazole and [facility] Clotrimazole 3. I am also having her start on fluconazole. Additionally, I am having her maintain her venlafaxine XR.  Meds ordered this encounter  Medications  . fluconazole (DIFLUCAN) 150 MG tablet    Sig: 1 po q72 hours x 3 doses than 1 po q week x 6 months    Dispense:  30 tablet    Refill:  0    Problem List Items Addressed This Visit       Unprioritized   Acute vaginitis    Check urine ancillary Check labs Diflucan for 6 months       Relevant Orders   POCT Urinalysis Dipstick (Automated) (Completed)   Family history of diabetes mellitus (DM)    Check labs        Relevant Orders   Urine cytology ancillary only([place])   Basic metabolic panel   Hemoglobin A1c   POCT Urinalysis Dipstick (Automated) (Completed)    Other Visit Diagnoses    Vaginal yeast infection    -  Primary   Relevant Medications   fluconazole (DIFLUCAN) 150 MG tablet   Other Relevant Orders   Urine cytology ancillary only([place])   Basic metabolic panel   Hemoglobin A1c      Follow-up: Return if symptoms worsen or fail to improve.  Donato Schultz, DO

## 2019-09-30 NOTE — Assessment & Plan Note (Signed)
Check labs 

## 2019-09-30 NOTE — Assessment & Plan Note (Signed)
Check urine ancillary Check labs Diflucan for 6 months

## 2019-09-30 NOTE — Patient Instructions (Signed)
Vaginitis Vaginitis is a condition in which the vaginal tissue swells and becomes red (inflamed). This condition is most often caused by a change in the normal balance of bacteria and yeast that live in the vagina. This change causes an overgrowth of certain bacteria or yeast, which causes the inflammation. There are different types of vaginitis, but the most common types are:  Bacterial vaginosis.  Yeast infection (candidiasis).  Trichomoniasis vaginitis. This is a sexually transmitted disease (STD).  Viral vaginitis.  Atrophic vaginitis.  Allergic vaginitis. What are the causes? The cause of this condition depends on the type of vaginitis. It can be caused by:  Bacteria (bacterial vaginosis).  Yeast, which is a fungus (yeast infection).  A parasite (trichomoniasis vaginitis).  A virus (viral vaginitis).  Low hormone levels (atrophic vaginitis). Low hormone levels can occur during pregnancy, breastfeeding, or after menopause.  Irritants, such as bubble baths, scented tampons, and feminine sprays (allergic vaginitis). Other factors can change the normal balance of the yeast and bacteria that live in the vagina. These include:  Antibiotic medicines.  Poor hygiene.  Diaphragms, vaginal sponges, spermicides, birth control pills, and intrauterine devices (IUD).  Sex.  Infection.  Uncontrolled diabetes.  A weakened defense (immune) system. What increases the risk? This condition is more likely to develop in women who:  Smoke.  Use vaginal douches, scented tampons, or scented sanitary pads.  Wear tight-fitting pants.  Wear thong underwear.  Use oral birth control pills or an IUD.  Have sex without a condom.  Have multiple sex partners.  Have an STD.  Frequently use the spermicide nonoxynol-9.  Eat lots of foods high in sugar.  Have uncontrolled diabetes.  Have low estrogen levels.  Have a weakened immune system from an immune disorder or medical  treatment.  Are pregnant or breastfeeding. What are the signs or symptoms? Symptoms vary depending on the cause of the vaginitis. Common symptoms include:  Abnormal vaginal discharge. ? The discharge is white, gray, or yellow with bacterial vaginosis. ? The discharge is thick, white, and cheesy with a yeast infection. ? The discharge is frothy and yellow or greenish with trichomoniasis.  A bad vaginal smell. The smell is fishy with bacterial vaginosis.  Vaginal itching, pain, or swelling.  Sex that is painful.  Pain or burning when urinating. Sometimes there are no symptoms. How is this diagnosed? This condition is diagnosed based on your symptoms and medical history. A physical exam, including a pelvic exam, will also be done. You may also have other tests, including:  Tests to determine the pH level (acidity or alkalinity) of your vagina.  A whiff test, to assess the odor that results when a sample of your vaginal discharge is mixed with a potassium hydroxide solution.  Tests of vaginal fluid. A sample will be examined under a microscope. How is this treated? Treatment varies depending on the type of vaginitis you have. Your treatment may include:  Antibiotic creams or pills to treat bacterial vaginosis and trichomoniasis.  Antifungal medicines, such as vaginal creams or suppositories, to treat a yeast infection.  Medicine to ease discomfort if you have viral vaginitis. Your sexual partner should also be treated.  Estrogen delivered in a cream, pill, suppository, or vaginal ring to treat atrophic vaginitis. If vaginal dryness occurs, lubricants and moisturizing creams may help. You may need to avoid scented soaps, sprays, or douches.  Stopping use of a product that is causing allergic vaginitis. Then using a vaginal cream to treat the symptoms. Follow   these instructions at home: Lifestyle  Keep your genital area clean and dry. Avoid soap, and only rinse the area with  water.  Do not douche or use tampons until your health care provider says it is okay to do so. Use sanitary pads, if needed.  Do not have sex until your health care provider approves. When you can return to sex, practice safe sex and use condoms.  Wipe from front to back. This avoids the spread of bacteria from the rectum to the vagina. General instructions  Take over-the-counter and prescription medicines only as told by your health care provider.  If you were prescribed an antibiotic medicine, take or use it as told by your health care provider. Do not stop taking or using the antibiotic even if you start to feel better.  Keep all follow-up visits as told by your health care provider. This is important. How is this prevented?  Use mild, non-scented products. Do not use things that can irritate the vagina, such as fabric softeners. Avoid the following products if they are scented: ? Feminine sprays. ? Detergents. ? Tampons. ? Feminine hygiene products. ? Soaps or bubble baths.  Let air reach your genital area. ? Wear cotton underwear to reduce moisture buildup. ? Avoid wearing underwear while you sleep. ? Avoid wearing tight pants and underwear or nylons without a cotton panel. ? Avoid wearing thong underwear.  Take off any wet clothing, such as bathing suits, as soon as possible.  Practice safe sex and use condoms. Contact a health care provider if:  You have abdominal pain.  You have a fever.  You have symptoms that last for more than 2-3 days. Get help right away if:  You have a fever and your symptoms suddenly get worse. Summary  Vaginitis is a condition in which the vaginal tissue becomes inflamed.This condition is most often caused by a change in the normal balance of bacteria and yeast that live in the vagina.  Treatment varies depending on the type of vaginitis you have.  Do not douche, use tampons , or have sex until your health care provider approves. When  you can return to sex, practice safe sex and use condoms. This information is not intended to replace advice given to you by your health care provider. Make sure you discuss any questions you have with your health care provider. Document Revised: 03/03/2017 Document Reviewed: 04/26/2016 Elsevier Patient Education  2020 [employer]  

## 2019-10-01 LAB — BASIC METABOLIC PANEL
BUN: 13 mg/dL (ref 6–23)
CO2: 25 mEq/L (ref 19–32)
Calcium: 9.5 mg/dL (ref 8.4–10.5)
Chloride: 103 mEq/L (ref 96–112)
Creatinine, Ser: 0.76 mg/dL (ref 0.40–1.20)
GFR: 86.07 mL/min (ref >60.00)
Glucose, Bld: 97 mg/dL (ref 70–99)
Potassium: 4.2 mEq/L (ref 3.5–5.1)
Sodium: 138 mEq/L (ref 135–145)

## 2019-10-01 LAB — HEMOGLOBIN A1C: Hgb A1c MFr Bld: 5.4 % (ref 4.6–6.5)

## 2019-10-02 ENCOUNTER — Other Ambulatory Visit: Payer: Self-pay

## 2019-10-02 ENCOUNTER — Ambulatory Visit: Payer: PRIVATE HEALTH INSURANCE | Admitting: Family Medicine

## 2019-10-02 ENCOUNTER — Encounter: Payer: Self-pay | Admitting: Family Medicine

## 2019-10-02 VITALS — BP 116/84 | HR 91 | Ht 66.0 in | Wt 120.0 lb

## 2019-10-02 DIAGNOSIS — M533 Sacrococcygeal disorders, not elsewhere classified: Secondary | ICD-10-CM | POA: Diagnosis not present

## 2019-10-02 DIAGNOSIS — M999 Biomechanical lesion, unspecified: Secondary | ICD-10-CM

## 2019-10-02 NOTE — Assessment & Plan Note (Signed)
Patient has made significant progress.  Does have some of the sacroiliac dysfunction but seems to be improving overall.  Has started swimming which I think will be beneficial.  Increase activity slowly otherwise.  Patient is to encourage her to eat a little more protein.  Continue the Effexor at the dose.  Encouraged her to take the Diflucan that was prescribed by other provider.  Follow-up with me again 8 to 12 weeks

## 2019-10-02 NOTE — Patient Instructions (Signed)
Good to see you Continue effexor Continue swim I think it is helping See me again in 6 weeks

## 2019-10-02 NOTE — Progress Notes (Signed)
[name] [address] [place] [address] Phone: [phone] Subjective:   I Debbie [name] am serving as a Neurosurgeon for Dr. Antoine Primas.  This visit occurred during the SARS-CoV-2 public health emergency.  Safety protocols were in place, including screening questions prior to the visit, additional usage of staff PPE, and extensive cleaning of exam room while observing appropriate contact time as indicated for disinfecting solutions.   I'm seeing this patient by the request  of:  Sandford Craze, NP  CC: Low back exam with follow-up  [id number]  Chrisa Dallaire is a 34 y.o. female coming in with complaint of back and neck pain. OMT 08/14/2019. Patient states she is doing well. Recently increased effexor which has helped. Can't drive 30 mins without pain. Currently being treated to reoccurring yeast infection that she believes could be secondary to the Effexor but wanting to rest.  Overall though does feel that the medication has helped significantly with the pain as well as some underlying anxiety.  Medications patient has been prescribed: Effexor 150 mg daily          Reviewed prior external information including notes and imaging from previsou exam, outside providers and external EMR if available.   As well as notes that were available from care everywhere and other healthcare systems.  Past medical history, social, surgical and family history all reviewed in electronic medical record.  No pertanent information unless stated regarding to the chief complaint.   Past Medical History:  Diagnosis Date  . Abnormal heart rhythm   . History of chicken pox   . POTS (postural orthostatic tachycardia syndrome)     Allergies  Allergen Reactions  . Gluten Meal      Review of Systems:  No headache, visual changes, nausea, vomiting, diarrhea, constipation, dizziness, abdominal pain, skin rash, fevers, chills, night sweats, weight  loss, swollen lymph nodes, body aches, joint swelling, chest pain, shortness of breath, mood changes. POSITIVE muscle aches  Objective  Blood pressure 116/84, pulse 91, height 5\' 6"  (1.676 m), weight 120 lb (54.4 kg), SpO2 98 %.   General: No apparent distress alert and oriented x3 mood and affect normal, dressed appropriately.  HEENT: Pupils equal, extraocular movements intact  Respiratory: Patient's speak in full sentences and does not appear short of breath  Cardiovascular: No lower extremity edema, non tender, no erythema  Neuro: Cranial nerves II through XII are intact, neurovascularly intact in all extremities with 2+ DTRs and 2+ pulses.  Gait normal with good balance and coordination.  MSK:  Non tender with full range of motion and good stability and symmetric strength and tone of shoulders, elbows, wrist, hip, knee and ankles bilaterally.  Back - Normal skin, Spine with normal alignment and no deformity.  No tenderness to vertebral process palpation.  Paraspinous muscles are not tender and without spasm.   Range of motion is full at neck and lumbar sacral regions  Osteopathic findings  C2 flexed rotated and side bent right T3 extended rotated and side bent right inhaled rib T9 extended rotated and side bent left L1 flexed rotated and side bent right Sacrum right on right       Assessment and Plan:  Coccydynia Patient has made significant progress.  Does have some of the sacroiliac dysfunction but seems to be improving overall.  Has started swimming which I think will be beneficial.  Increase activity slowly otherwise.  Patient is to encourage her to eat a little more protein.  Continue the Effexor at the dose.  Encouraged her to take the Diflucan that was prescribed by other provider.  Follow-up with me again 8 to 12 weeks    Nonallopathic problems  Decision today to treat with OMT was based on Physical Exam  After verbal consent patient was treated with HVLA, ME, FPR  techniques in cervical, rib, thoracic, lumbar, and sacral  areas  Patient tolerated the procedure well with improvement in symptoms  Patient given exercises, stretches and lifestyle modifications  See medications in patient instructions if given  Patient will follow up in 8-12 weeks      The above documentation has been reviewed and is accurate and complete Judi Saa, DO       Note: This dictation was prepared with Dragon dictation along with smaller phrase technology. Any transcriptional errors that result from this process are unintentional.

## 2019-10-04 ENCOUNTER — Other Ambulatory Visit: Payer: Self-pay | Admitting: Family Medicine

## 2019-10-04 LAB — URINE CYTOLOGY ANCILLARY ONLY
Bacterial Vaginitis-Urine: NEGATIVE
Candida Urine: NEGATIVE
Chlamydia: NEGATIVE
Comment: NEGATIVE
Comment: NEGATIVE
Comment: NORMAL
Neisseria Gonorrhea: NEGATIVE
Trichomonas: NEGATIVE

## 2019-11-06 ENCOUNTER — Other Ambulatory Visit: Payer: Self-pay

## 2019-11-06 ENCOUNTER — Encounter: Payer: Self-pay | Admitting: Family Medicine

## 2019-11-06 ENCOUNTER — Ambulatory Visit: Payer: PRIVATE HEALTH INSURANCE | Admitting: Family Medicine

## 2019-11-06 VITALS — BP 112/82 | HR 96 | Ht 66.0 in | Wt 121.0 lb

## 2019-11-06 DIAGNOSIS — M533 Sacrococcygeal disorders, not elsewhere classified: Secondary | ICD-10-CM

## 2019-11-06 DIAGNOSIS — M999 Biomechanical lesion, unspecified: Secondary | ICD-10-CM | POA: Diagnosis not present

## 2019-11-06 NOTE — Progress Notes (Signed)
[name] [address] [place] [address] Phone: [phone] Subjective:   I Debbie [name] am serving as a Neurosurgeon for Dr. Antoine Primas.  This visit occurred during the SARS-CoV-2 public health emergency.  Safety protocols were in place, including screening questions prior to the visit, additional usage of staff PPE, and extensive cleaning of exam room while observing appropriate contact time as indicated for disinfecting solutions.   I'm seeing this patient by the request  of:  Sandford Craze, NP  CC: Low back pain follow-up  [id number]  Kory Casares is a 34 y.o. female coming in with complaint of back and neck pain. OMT 10/02/2019. Patient states she is progressing slowly.  Patient does have still significant improvement from previously.  Is been swimming in the pool.  Taking the Effexor.  Feels like it has been helpful.  Never stopping her from activity at this time.  And increasing activity slowly  Medications patient has been prescribed: Effexor  Taking: Yes         Reviewed prior external information including notes and imaging from previsou exam, outside providers and external EMR if available.   As well as notes that were available from care everywhere and other healthcare systems.  Past medical history, social, surgical and family history all reviewed in electronic medical record.  No pertanent information unless stated regarding to the chief complaint.   Past Medical History:  Diagnosis Date  . Abnormal heart rhythm   . History of chicken pox   . POTS (postural orthostatic tachycardia syndrome)     Allergies  Allergen Reactions  . Gluten Meal      Review of Systems:  No headache, visual changes, nausea, vomiting, diarrhea, constipation, dizziness, abdominal pain, skin rash, fevers, chills, night sweats, weight loss, swollen lymph nodes, body aches, joint swelling, chest pain, shortness of breath, mood  changes. POSITIVE muscle aches  Objective  There were no vitals taken for this visit.   General: No apparent distress alert and oriented x3 mood and affect normal, dressed appropriately.  HEENT: Pupils equal, extraocular movements intact  Respiratory: Patient's speak in full sentences and does not appear short of breath  Cardiovascular: No lower extremity edema, non tender, no erythema  Neuro: Cranial nerves II through XII are intact, neurovascularly intact in all extremities with 2+ DTRs and 2+ pulses.  Gait normal with good balance and coordination.  MSK:  Non tender with full range of motion and good stability and symmetric strength and tone of shoulders, elbows, wrist, hip, knee and ankles bilaterally.  Back -low back exam does show some mild loss of lordosis.  Patient does still have some mild pain over the sacroiliac joint right greater than left.  Osteopathic findings  C2 flexed rotated and side bent right C6 flexed rotated and side bent left T3 extended rotated and side bent right inhaled rib T9 extended rotated and side bent left L2 flexed rotated and side bent right Sacrum right on right       Assessment and Plan:  SI (sacroiliac) joint dysfunction Stable.  Doing really well at this time.  Continue the Effexor.  We will not increase any of the dose.  We may need to try to titrate off when we do decide to do so patient is also doing much better with the medication at this time patient will continue the home exercises and icing regimen.  Follow-up again 8 to 12 weeks    Nonallopathic problems  Decision today  to treat with OMT was based on Physical Exam  After verbal consent patient was treated with HVLA, ME, FPR techniques in cervical, rib, thoracic, lumbar, and sacral  areas  Patient tolerated the procedure well with improvement in symptoms  Patient given exercises, stretches and lifestyle modifications  See medications in patient instructions if given  Patient  will follow up in 4-8 weeks      The above documentation has been reviewed and is accurate and complete Judi Saa, DO       Note: This dictation was prepared with Dragon dictation along with smaller phrase technology. Any transcriptional errors that result from this process are unintentional.

## 2019-11-06 NOTE — Patient Instructions (Addendum)
Good to see you Scapular exercises Be aware of posture See me again in 7-8 weeks

## 2019-11-06 NOTE — Assessment & Plan Note (Signed)
Stable.  Doing really well at this time.  Continue the Effexor.  We will not increase any of the dose.  We may need to try to titrate off when we do decide to do so patient is also doing much better with the medication at this time patient will continue the home exercises and icing regimen.  Follow-up again 8 to 12 weeks

## 2019-11-14 ENCOUNTER — Encounter: Payer: Self-pay | Admitting: Family Medicine

## 2019-12-26 ENCOUNTER — Ambulatory Visit: Payer: PRIVATE HEALTH INSURANCE | Admitting: Family Medicine

## 2019-12-26 ENCOUNTER — Other Ambulatory Visit: Payer: Self-pay

## 2019-12-26 ENCOUNTER — Encounter: Payer: Self-pay | Admitting: Family Medicine

## 2019-12-26 VITALS — BP 116/76 | HR 82 | Ht 66.0 in | Wt 123.0 lb

## 2019-12-26 DIAGNOSIS — M5416 Radiculopathy, lumbar region: Secondary | ICD-10-CM | POA: Diagnosis not present

## 2019-12-26 DIAGNOSIS — M999 Biomechanical lesion, unspecified: Secondary | ICD-10-CM | POA: Diagnosis not present

## 2019-12-26 MED ORDER — GABAPENTIN 100 MG PO CAPS
200.0000 mg | ORAL_CAPSULE | Freq: Every day | ORAL | 0 refills | Status: DC
Start: 2019-12-26 — End: 2020-06-18

## 2019-12-26 NOTE — Patient Instructions (Signed)
[place]  Keep doing exercises See me 6-7 weeks

## 2019-12-26 NOTE — Assessment & Plan Note (Signed)
Likely doing very well with the medication and the Effexor.  No exacerbations.  Responding well to manipulation.  Patient does have hypermobility as well.  Discussed with today which wants to avoid.  Increase activity as tolerated.  Follow-up again in 4 to 8 weeks

## 2019-12-26 NOTE — Progress Notes (Signed)
[name] [address] [place] [address] Phone: [phone] Subjective:   Debbie [name], am serving as a scribe for Dr. Antoine Primas. This visit occurred during the SARS-CoV-2 public health emergency.  Safety protocols were in place, including screening questions prior to the visit, additional usage of staff PPE, and extensive cleaning of exam room while observing appropriate contact time as indicated for disinfecting solutions.   I'm seeing this patient by the request  of:  Sandford Craze, NP  CC: Back and neck pain  [id number]  Charon Camba is a 34 y.o. female coming in with complaint of back and neck pain. OMT 11/06/2019. Patient states that sitting seems to cause a mild exacerbation.  Nothing no severe.  Still doing significantly better overall.  Has been put on some mild limitations at work which has been helpful.  Medications patient has been prescribed: Effexor  Taking: Yes         Reviewed prior external information including notes and imaging from previsou exam, outside providers and external EMR if available.   As well as notes that were available from care everywhere and other healthcare systems.  Past medical history, social, surgical and family history all reviewed in electronic medical record.  No pertanent information unless stated regarding to the chief complaint.   Past Medical History:  Diagnosis Date  . Abnormal heart rhythm   . History of chicken pox   . POTS (postural orthostatic tachycardia syndrome)     Allergies  Allergen Reactions  . Gluten Meal      Review of Systems:  No headache, visual changes, nausea, vomiting, diarrhea, constipation, dizziness, abdominal pain, skin rash, fevers, chills, night sweats, weight loss, swollen lymph nodes, body aches, joint swelling, chest pain, shortness of breath, mood changes. POSITIVE muscle aches  Objective  Blood pressure 116/76, pulse 82, height  5\' 6"  (1.676 m), weight 123 lb (55.8 kg), SpO2 99 %.   General: No apparent distress alert and oriented x3 mood and affect normal, dressed appropriately.  HEENT: Pupils equal, extraocular movements intact  Respiratory: Patient's speak in full sentences and does not appear short of breath  Cardiovascular: No lower extremity edema, non tender, no erythema  Neuro: Cranial nerves II through XII are intact, neurovascularly intact in all extremities with 2+ DTRs and 2+ pulses.  Gait normal with good balance and coordination.  MSK:    Hypermobility noted. Back -low back does have some tenderness to palpation more around the sacroiliac joint.  Seems to be more left than right at this time.  Patient does have some very mild FABER test.  Negative straight leg test.  Osteopathic findings  C7 flexed rotated and side bent left T3 extended rotated and side bent right inhaled rib T9 extended rotated and side bent left L4 flexed rotated and side bent right Sacrum right on right       Assessment and Plan:  Lumbar radiculopathy Likely doing very well with the medication and the Effexor.  No exacerbations.  Responding well to manipulation.  Patient does have hypermobility as well.  Discussed with today which wants to avoid.  Increase activity as tolerated.  Follow-up again in 4 to 8 weeks    Nonallopathic problems  Decision today to treat with OMT was based on Physical Exam  After verbal consent patient was treated with HVLA, ME, FPR techniques in cervical, rib, thoracic, lumbar, and sacral  areas  Patient tolerated the procedure well with improvement in symptoms  Patient given exercises, stretches and lifestyle modifications  See medications in patient instructions if given  Patient will follow up in 4-8 weeks      The above documentation has been reviewed and is accurate and complete Judi Saa, DO       Note: This dictation was prepared with Dragon dictation along with smaller  phrase technology. Any transcriptional errors that result from this process are unintentional.

## 2020-02-06 ENCOUNTER — Encounter: Payer: Self-pay | Admitting: Family Medicine

## 2020-02-06 ENCOUNTER — Other Ambulatory Visit: Payer: Self-pay

## 2020-02-06 ENCOUNTER — Ambulatory Visit: Payer: PRIVATE HEALTH INSURANCE | Admitting: Family Medicine

## 2020-02-06 VITALS — BP 122/82 | HR 86 | Ht 66.0 in | Wt 126.0 lb

## 2020-02-06 DIAGNOSIS — M533 Sacrococcygeal disorders, not elsewhere classified: Secondary | ICD-10-CM | POA: Diagnosis not present

## 2020-02-06 DIAGNOSIS — M999 Biomechanical lesion, unspecified: Secondary | ICD-10-CM

## 2020-02-06 NOTE — Progress Notes (Signed)
[name] [address] [place] [address] Phone: [phone] Subjective:   [name], am serving as a scribe for Dr. Antoine Primas. This visit occurred during the SARS-CoV-2 public health emergency.  Safety protocols were in place, including screening questions prior to the visit, additional usage of staff PPE, and extensive cleaning of exam room while observing appropriate contact time as indicated for disinfecting solutions.   I'm seeing this patient by the request  of:  Sandford Craze, NP  CC: Neck and back pain follow-up  [id number]  Debbie [name] is a 34 y.o. female coming in with complaint of back and neck pain. OMT 12/26/2019. Patient states that her pain continues to improve. Pain can radiate from right to left SI joints. Using Effexor and gabapentin 100mg .   Medications patient has been prescribed: Effexor, Gabapentin  Taking: Yes         Reviewed prior external information including notes and imaging from previsou exam, outside providers and external EMR if available.   As well as notes that were available from care everywhere and other healthcare systems.  Past medical history, social, surgical and family history all reviewed in electronic medical record.  No pertanent information unless stated regarding to the chief complaint.   Past Medical History:  Diagnosis Date   Abnormal heart rhythm    History of chicken pox    POTS (postural orthostatic tachycardia syndrome)     Allergies  Allergen Reactions   Gluten Meal      Review of Systems:  No headache, visual changes, nausea, vomiting, diarrhea, constipation, dizziness, abdominal pain, skin rash, fevers, chills, night sweats, weight loss, swollen lymph nodes, body aches, joint swelling, chest pain, shortness of breath, mood changes. POSITIVE muscle aches  Objective  Blood pressure 122/82, pulse 86, height 5\' 6"  (1.676 m), weight 126 lb (57.2 kg),  SpO2 98 %.   General: No apparent distress alert and oriented x3 mood and affect normal, dressed appropriately.  HEENT: Pupils equal, extraocular movements intact  Respiratory: Patient's speak in full sentences and does not appear short of breath  Cardiovascular: No lower extremity edema, non tender, no erythema  Neuro: Cranial nerves II through XII are intact, neurovascularly intact in all extremities with 2+ DTRs and 2+ pulses.  Gait normal with good balance and coordination.  MSK:  Non tender with full range of motion and good stability and symmetric strength and tone of shoulders, elbows, wrist, hip, knee and ankles bilaterally.  Back -low back exam does have some very mild tightness in the paraspinal musculature.  Patient does have some very mild positive right greater than left FABER test.  Patient does have good range of motion and improvement in core strength.  Osteopathic findings  C2 flexed rotated and side bent right C6 flexed rotated and side bent left T3 extended rotated and side bent right inhaled rib T8 extended rotated and side bent left L4 flexed rotated and side bent left  Sacrum right on right       Assessment and Plan:  SI (sacroiliac) joint dysfunction Patient is doing exceptionally better at this time.  We discussed no change in medication and continue the Effexor and the gabapentin at this time.  Patient has made significant progress.  Follow-up again in 7 to 8 weeks    Nonallopathic problems  Decision today to treat with OMT was based on Physical Exam  After verbal consent patient was treated with HVLA, ME, FPR techniques in cervical, rib,  thoracic, lumbar, and sacral  areas  Patient tolerated the procedure well with improvement in symptoms  Patient given exercises, stretches and lifestyle modifications  See medications in patient instructions if given  Patient will follow up in 4-8 weeks      The above documentation has been reviewed and is  accurate and complete Judi Saa, DO       Note: This dictation was prepared with Dragon dictation along with smaller phrase technology. Any transcriptional errors that result from this process are unintentional.

## 2020-02-06 NOTE — Assessment & Plan Note (Signed)
Patient is doing exceptionally better at this time.  We discussed no change in medication and continue the Effexor and the gabapentin at this time.  Patient has made significant progress.  Follow-up again in 7 to 8 weeks

## 2020-02-06 NOTE — Patient Instructions (Signed)
Doing exceptionally better Keep it up See me in 7-8 weeks

## 2020-03-25 NOTE — Progress Notes (Signed)
[name] [address] [place] [address] Phone: [phone] Subjective:   Debbie [name], am serving as a scribe for Dr. Antoine Primas. This visit occurred during the SARS-CoV-2 public health emergency.  Safety protocols were in place, including screening questions prior to the visit, additional usage of staff PPE, and extensive cleaning of exam room while observing appropriate contact time as indicated for disinfecting solutions.   I'm seeing this patient by the request  of:  Sandford Craze, NP  CC: back pain follow up   [id number]  Debbie [name] is a 34 y.o. female coming in with complaint of back and neck pain. OMT 02/06/2020. Patient states that her pain is not any better but not any worse. Patient states that she is still not without pain but is not having any significant discomfort at the time. Has been able to continue to swim 3-4 times a week. Still avoiding sitting on hard surfaces  Medications patient has been prescribed: Gabapentin, Effexor  Taking: Yes         Reviewed prior external information including notes and imaging from previsou exam, outside providers and external EMR if available.   As well as notes that were available from care everywhere and other healthcare systems.  Past medical history, social, surgical and family history all reviewed in electronic medical record.  No pertanent information unless stated regarding to the chief complaint.   Past Medical History:  Diagnosis Date  . Abnormal heart rhythm   . History of chicken pox   . POTS (postural orthostatic tachycardia syndrome)     Allergies  Allergen Reactions  . Gluten Meal      Review of Systems:  No headache, visual changes, nausea, vomiting, diarrhea, constipation, dizziness, abdominal pain, skin rash, fevers, chills, night sweats, weight loss, swollen lymph nodes, body aches, joint swelling, chest pain, shortness of breath, mood  changes. POSITIVE muscle aches  Objective  Blood pressure 118/82, pulse 90, height 5\' 6"  (1.676 m), weight 124 lb (56.2 kg), SpO2 98 %.   General: No apparent distress alert and oriented x3 mood and affect normal, dressed appropriately.  HEENT: Pupils equal, extraocular movements intact  Respiratory: Patient's speak in full sentences and does not appear short of breath  Cardiovascular: No lower extremity edema, non tender, no erythema  Neuro: Cranial nerves II through XII are intact, neurovascularly intact in all extremities with 2+ DTRs and 2+ pulses.  Gait normal with good balance and coordination.  MSK:  Non tender with full range of motion and good stability and symmetric strength and tone of shoulders, elbows, wrist, hip, knee and ankles bilaterally.  Back -patient does have some mild loss of lordosis. Still some mild tenderness over the sacroiliac joint. Patient still has some mild discomfort over the coccyx itself. Tightness of the hamstring on the right greater than left. Negative FABER test today though.  Osteopathic findings  C7 flexed rotated and side bent left T3 extended rotated and side bent right inhaled rib T8 extended rotated and side bent left L4 flexed rotated and side bent left  Sacrum right on right       Assessment and Plan:  Coccydynia Patient is still having some signs and symptoms of the coccydynia.  Patient has responded fairly well to the Effexor and the gabapentin.  We discussed anti-inflammatories for short course as well.  Discussed icing regimen.  Patient is going to continue to monitor.  See me again in 6 to 8 weeks  SI (sacroiliac) joint dysfunction Still mild chronic problem.  Has responded well to manipulation.  Discussed posture and ergonomics.  Increase activity slowly.  Discussed the medications for this chronic problem with mild exacerbation.  Follow-up again 6 to 8 weeks   Nonallopathic problems  Decision today to treat with OMT was based on  Physical Exam  After verbal consent patient was treated with HVLA, ME, FPR techniques in cervical, rib, thoracic, lumbar, and sacral  areas  Patient tolerated the procedure well with improvement in symptoms  Patient given exercises, stretches and lifestyle modifications  See medications in patient instructions if given  Patient will follow up in 4-8 weeks      The above documentation has been reviewed and is accurate and complete Judi Saa, DO       Note: This dictation was prepared with Dragon dictation along with smaller phrase technology. Any transcriptional errors that result from this process are unintentional.

## 2020-03-26 ENCOUNTER — Ambulatory Visit: Payer: PRIVATE HEALTH INSURANCE | Admitting: Family Medicine

## 2020-03-26 ENCOUNTER — Encounter: Payer: Self-pay | Admitting: Family Medicine

## 2020-03-26 ENCOUNTER — Other Ambulatory Visit: Payer: Self-pay

## 2020-03-26 VITALS — BP 118/82 | HR 90 | Ht 66.0 in | Wt 124.0 lb

## 2020-03-26 DIAGNOSIS — M533 Sacrococcygeal disorders, not elsewhere classified: Secondary | ICD-10-CM

## 2020-03-26 DIAGNOSIS — M999 Biomechanical lesion, unspecified: Secondary | ICD-10-CM | POA: Diagnosis not present

## 2020-03-26 NOTE — Assessment & Plan Note (Signed)
Still mild chronic problem.  Has responded well to manipulation.  Discussed posture and ergonomics.  Increase activity slowly.  Discussed the medications for this chronic problem with mild exacerbation.  Follow-up again 6 to 8 weeks

## 2020-03-26 NOTE — Patient Instructions (Signed)
Remember 3 pills 3 times a day for 3 days for the ibuprofen  Continue other meds Love the swimming but could add a little more activity if you want See me again in 6-8 weeks

## 2020-03-26 NOTE — Assessment & Plan Note (Signed)
Patient is still having some signs and symptoms of the coccydynia.  Patient has responded fairly well to the Effexor and the gabapentin.  We discussed anti-inflammatories for short course as well.  Discussed icing regimen.  Patient is going to continue to monitor.  See me again in 6 to 8 weeks

## 2020-04-07 ENCOUNTER — Encounter: Payer: Self-pay | Admitting: Family Medicine

## 2020-04-18 ENCOUNTER — Other Ambulatory Visit: Payer: PRIVATE HEALTH INSURANCE

## 2020-04-18 ENCOUNTER — Other Ambulatory Visit: Payer: Self-pay

## 2020-04-18 DIAGNOSIS — Z20822 Contact with and (suspected) exposure to covid-19: Secondary | ICD-10-CM

## 2020-04-21 LAB — NOVEL CORONAVIRUS, NAA: SARS-CoV-2, NAA: DETECTED — AB

## 2020-05-13 NOTE — Progress Notes (Signed)
[name] [address] [place] [address] Phone: [phone] Subjective:   Debbie [name], am serving as a scribe for Dr. Antoine Primas.  This visit occurred during the SARS-CoV-2 public health emergency.  Safety protocols were in place, including screening questions prior to the visit, additional usage of staff PPE, and extensive cleaning of exam room while observing appropriate contact time as indicated for disinfecting solutions.   I'm seeing this patient by the request  of:  Sandford Craze, NP  CC: Low back pain follow-up  [id number]  Olia Schendel is a 35 y.o. female coming in with complaint of back and neck pain. OMT 03/26/2020. Patient states that her back is bothering her and thinks that when she had COVID and she was laid up in bed it caused her back to hurt more than usual.   Medications patient has been prescribed: Gabapentin, Effexor  Taking: Yes         Reviewed prior external information including notes and imaging from previsou exam, outside providers and external EMR if available.   As well as notes that were available from care everywhere and other healthcare systems.  Past medical history, social, surgical and family history all reviewed in electronic medical record.  No pertanent information unless stated regarding to the chief complaint.   Past Medical History:  Diagnosis Date  . Abnormal heart rhythm   . History of chicken pox   . POTS (postural orthostatic tachycardia syndrome)     Allergies  Allergen Reactions  . Gluten Meal      Review of Systems:  No headache, visual changes, nausea, vomiting, diarrhea, constipation, dizziness, abdominal pain, skin rash, fevers, chills, night sweats, weight loss, swollen lymph nodes, body aches, joint swelling, chest pain, shortness of breath, mood changes. POSITIVE muscle aches  Objective  Blood pressure 110/60, pulse 88, height 5\' 6"  (1.676 m), weight  113 lb (51.3 kg), SpO2 99 %.   General: No apparent distress alert and oriented x3 mood and affect normal, dressed appropriately.  HEENT: Pupils equal, extraocular movements intact  Respiratory: Patient's speak in full sentences and does not appear short of breath  Cardiovascular: No lower extremity edema, non tender, no erythema  Gait normal with good balance and coordination.  MSK:  Non tender with full range of motion and good stability and symmetric strength and tone of shoulders, elbows, wrist, hip, knee and ankles bilaterally.  Hypermobility noted. Back -low back exam does have some mild loss of lordosis.  Still mild tenderness over the sacroiliac joints bilaterally right greater than left.  Negative Faber negative straight leg test.  Mild pain over the coccyx still noted as well. Very mild scapular dyskinesis noted of the inferior aspect. Patient does have some mild posterior lymphadenopathy still noted.  Osteopathic findings  C2 flexed rotated and side bent right T3 extended rotated and side bent right inhaled rib L2 flexed rotated and side bent right L4 flexed rotated and side bent left Sacrum right on right        Assessment and Plan:  Lumbar radiculopathy Patient did have Covid and decrease the amount of activity she was doing and starting to have some mild increase in radicular symptoms again.  Discussed with patient in great length about icing regimen and home exercises.  Continue the medications.  Discussed vitamin supplementations.  Follow-up with me again in 4 to 5 weeks    Nonallopathic problems  Decision today to treat with OMT was based on Physical  Exam  After verbal consent patient was treated with HVLA, ME, FPR techniques in cervical, rib, thoracic, lumbar, and sacral  areas  Patient tolerated the procedure well with improvement in symptoms  Patient given exercises, stretches and lifestyle modifications  See medications in patient instructions if  given  Patient will follow up in 4-8 weeks      The above documentation has been reviewed and is accurate and complete Judi Saa, DO       Note: This dictation was prepared with Dragon dictation along with smaller phrase technology. Any transcriptional errors that result from this process are unintentional.

## 2020-05-14 ENCOUNTER — Ambulatory Visit: Payer: PRIVATE HEALTH INSURANCE | Admitting: Family Medicine

## 2020-05-14 ENCOUNTER — Encounter: Payer: Self-pay | Admitting: Family Medicine

## 2020-05-14 ENCOUNTER — Other Ambulatory Visit: Payer: Self-pay

## 2020-05-14 VITALS — BP 110/60 | HR 88 | Ht 66.0 in | Wt 113.0 lb

## 2020-05-14 DIAGNOSIS — M999 Biomechanical lesion, unspecified: Secondary | ICD-10-CM | POA: Diagnosis not present

## 2020-05-14 DIAGNOSIS — M5416 Radiculopathy, lumbar region: Secondary | ICD-10-CM

## 2020-05-14 NOTE — Assessment & Plan Note (Signed)
Patient did have Covid and decrease the amount of activity she was doing and starting to have some mild increase in radicular symptoms again.  Discussed with patient in great length about icing regimen and home exercises.  Continue the medications.  Discussed vitamin supplementations.  Follow-up with me again in 4 to 5 weeks

## 2020-05-14 NOTE — Patient Instructions (Addendum)
Good to see you  CoQ 100mg  daily Vitamin D 2,000 daily Continue the exercises when you have energy See me again in 4-5 weeks

## 2020-06-01 ENCOUNTER — Encounter: Payer: Self-pay | Admitting: Family Medicine

## 2020-06-02 ENCOUNTER — Other Ambulatory Visit: Payer: Self-pay

## 2020-06-02 MED ORDER — MELOXICAM 15 MG PO TABS
15.0000 mg | ORAL_TABLET | Freq: Every day | ORAL | 0 refills | Status: DC
Start: 2020-06-02 — End: 2020-06-25

## 2020-06-17 NOTE — Progress Notes (Unsigned)
[name] [address] [place] [address] Phone: [phone] Subjective:   I Debbie [name] am serving as a Neurosurgeon for Dr. Antoine Primas.  This visit occurred during the SARS-CoV-2 public health emergency.  Safety protocols were in place, including screening questions prior to the visit, additional usage of staff PPE, and extensive cleaning of exam room while observing appropriate contact time as indicated for disinfecting solutions.   I'm seeing this patient by the request  of:  Sandford Craze, NP  CC: Low back pain follow-up  [id number]  Debbie [name] is a 35 y.o. female coming in with complaint of back and neck pain. OMT 05/14/2020.  Patient was having worsening pain and we did send in a meloxicam prescription and the end of February.  Patient states she feels about the same. 2 more weeks of meloxicam helped some. States she is not sure that she is progressing at this point.   Medications patient has been prescribed: Mobic, Effexor, gabapentin  Taking: Yes     Patient's MRI of the lumbar spine from January 2021 was completely unremarkable.    Reviewed prior external information including notes and imaging from previsou exam, outside providers and external EMR if available.   As well as notes that were available from care everywhere and other healthcare systems.  Past medical history, social, surgical and family history all reviewed in electronic medical record.  No pertanent information unless stated regarding to the chief complaint.   Past Medical History:  Diagnosis Date  . Abnormal heart rhythm   . History of chicken pox   . POTS (postural orthostatic tachycardia syndrome)     Allergies  Allergen Reactions  . Gluten Meal      Review of Systems:  No headache, visual changes, nausea, vomiting, diarrhea, constipation, dizziness, abdominal pain, skin rash, fevers, chills, night sweats, weight loss, swollen lymph  nodes, body aches, joint swelling, chest pain, shortness of breath, mood changes. POSITIVE muscle aches  Objective  Blood pressure 112/82, pulse 83, height 5\' 6"  (1.676 m), weight 124 lb (56.2 kg), SpO2 99 %.   General: No apparent distress alert and oriented x3 mood and affect normal, dressed appropriately.  HEENT: Pupils equal, extraocular movements intact  Respiratory: Patient's speak in full sentences and does not appear short of breath  Cardiovascular: No lower extremity edema, non tender, no erythema  Gait normal with good balance and coordination.  MSK:  Non tender with full range of motion and good stability and symmetric strength and tone of shoulders, elbows, wrist, hip, knee and ankles bilaterally.  Back low back exam does have some mild loss of lordosis.  Still some tenderness over the sacroiliac joints bilaterally right greater than left.  Mild increase in tightness with FABER test on the right.  Negative straight leg test bilaterally.  Still some pain over the coccyx area itself Mild to moderate scapular dyskinesis noted right greater than left.  Osteopathic findings  C2 flexed rotated and side bent right T4 extended rotated and side bent right inhaled rib L2 flexed rotated and side bent right L5 flexed rotated and side bent left Sacrum right on right We did manipulate patient's coccyx and caused from going from flexion to extension.  Athletic trainer was at bedside      Assessment and Plan:  Coccydynia Worsening pain in the coccydynia.  We discussed prednisone and given a prescription refill for when patient is going to be traveling next month.  Attempted osteopathic manipulation of  the coccyx.  Did have athletic trainer in the room.  Hopefully this will make some relief.  Toradol and Depo-Medrol given today as well to see if this will help.  Patient did respond well to this for the last year.  Patient is on Effexor.  We have done such things as advanced imaging of the MRI  of the lumbar but could potentially consider the MRI of the pelvis.  Follow-up with me again in 4 to 6 weeks    Nonallopathic problems  Decision today to treat with OMT was based on Physical Exam  After verbal consent patient was treated with HVLA, ME, FPR techniques in cervical, rib, thoracic, lumbar, and sacral  areas  Patient tolerated the procedure well with improvement in symptoms  Patient given exercises, stretches and lifestyle modifications  See medications in patient instructions if given  Patient will follow up in 4-8 weeks      The above documentation has been reviewed and is accurate and complete Judi Saa, DO       Note: This dictation was prepared with Dragon dictation along with smaller phrase technology. Any transcriptional errors that result from this process are unintentional.

## 2020-06-18 ENCOUNTER — Ambulatory Visit: Payer: PRIVATE HEALTH INSURANCE | Admitting: Family Medicine

## 2020-06-18 ENCOUNTER — Encounter: Payer: Self-pay | Admitting: Family Medicine

## 2020-06-18 ENCOUNTER — Other Ambulatory Visit: Payer: Self-pay

## 2020-06-18 VITALS — BP 112/82 | HR 83 | Ht 66.0 in | Wt 124.0 lb

## 2020-06-18 DIAGNOSIS — M533 Sacrococcygeal disorders, not elsewhere classified: Secondary | ICD-10-CM

## 2020-06-18 DIAGNOSIS — M999 Biomechanical lesion, unspecified: Secondary | ICD-10-CM

## 2020-06-18 MED ORDER — KETOROLAC TROMETHAMINE 60 MG/2ML IM SOLN
60.0000 mg | Freq: Once | INTRAMUSCULAR | Status: AC
  Administered 2020-06-18: 60 mg via INTRAMUSCULAR

## 2020-06-18 MED ORDER — GABAPENTIN 100 MG PO CAPS: 200.0000 mg | ORAL_CAPSULE | Freq: Every day | ORAL | 0 refills | Status: DC

## 2020-06-18 MED ORDER — PREDNISONE 20 MG PO TABS: 40.0000 mg | ORAL_TABLET | Freq: Every day | ORAL | 0 refills | Status: DC

## 2020-06-18 MED ORDER — METHYLPREDNISOLONE ACETATE 80 MG/ML IJ SUSP
80.0000 mg | Freq: Once | INTRAMUSCULAR | Status: AC
  Administered 2020-06-18: 80 mg via INTRAMUSCULAR

## 2020-06-18 NOTE — Patient Instructions (Addendum)
Good to see you Prednisone 40 mg for your trip 2 injections today Tried to manipulate to coccyx  Refilled gabapentin See me again in 5-6 weeks

## 2020-06-18 NOTE — Assessment & Plan Note (Signed)
Worsening pain in the coccydynia.  We discussed prednisone and given a prescription refill for when patient is going to be traveling next month.  Attempted osteopathic manipulation of the coccyx.  Did have athletic trainer in the room.  Hopefully this will make some relief.  Toradol and Depo-Medrol given today as well to see if this will help.  Patient did respond well to this for the last year.  Patient is on Effexor.  We have done such things as advanced imaging of the MRI of the lumbar but could potentially consider the MRI of the pelvis.  Follow-up with me again in 4 to 6 weeks

## 2020-06-25 ENCOUNTER — Other Ambulatory Visit: Payer: Self-pay | Admitting: Family Medicine

## 2020-06-30 ENCOUNTER — Other Ambulatory Visit: Payer: Self-pay | Admitting: Family Medicine

## 2020-07-22 NOTE — Progress Notes (Signed)
[name] [address] [place] [address] Phone: [phone] Subjective:   I Debbie [name] am serving as a Neurosurgeon for Dr. Antoine Primas.  This visit occurred during the SARS-CoV-2 public health emergency.  Safety protocols were in place, including screening questions prior to the visit, additional usage of staff PPE, and extensive cleaning of exam room while observing appropriate contact time as indicated for disinfecting solutions.   I'm seeing this patient by the request  of:  Sandford Craze, NP  CC: Neck and back pain follow-up  [id number]  Debbie [name] is a 35 y.o. female coming in with complaint of back and neck pain. OMT 06/18/2020. Patient states she recently flew to [place]. Had a steroid injection and pills for the trip. Pain during plane ride. After her trip she is back to where she was without much pain.   Medications patient has been prescribed: Gabapentin, Meloxicam, Prednisone, Effexor         Reviewed prior external information including notes and imaging from previsou exam, outside providers and external EMR if available.   As well as notes that were available from care everywhere and other healthcare systems.  Past medical history, social, surgical and family history all reviewed in electronic medical record.  No pertanent information unless stated regarding to the chief complaint.   Past Medical History:  Diagnosis Date  . Abnormal heart rhythm   . History of chicken pox   . POTS (postural orthostatic tachycardia syndrome)     Allergies  Allergen Reactions  . Gluten Meal      Review of Systems:  No headache, visual changes, nausea, vomiting, diarrhea, constipation, dizziness, abdominal pain, skin rash, fevers, chills, night sweats, weight loss, swollen lymph nodes, body aches, joint swelling, chest pain, shortness of breath, mood changes. POSITIVE muscle aches  Objective  Blood pressure 110/84,  pulse 75, height 5\' 6"  (1.676 m), weight 122 lb (55.3 kg), SpO2 100 %.   General: No apparent distress alert and oriented x3 mood and affect normal, dressed appropriately.  HEENT: Pupils equal, extraocular movements intact  Respiratory: Patient's speak in full sentences and does not appear short of breath  Cardiovascular: No lower extremity edema, non tender, no erythema  Gait normal with good balance and coordination.  MSK:  Non tender with full range of motion and good stability and symmetric strength and tone of shoulders, elbows, wrist, hip, knee and ankles bilaterally.  Back -low back exam very mild loss of lordosis.  Patient does have some mild hypermobility of other joints though noted.  Patient has a negative straight leg test.  Negative Faber.  Still more tenderness over the coccyx than anywhere else.  Osteopathic findings C6 flexed rotated and side bent left T9 extended rotated and side bent left with inhaled rib L2 flexed rotated and side bent right L5 flexed rotated left on left Sacrum right on right       Assessment and Plan:  Coccydynia Continues to give patient some trouble.  Likely no significant exacerbation at this time.  Has the gabapentin, Effexor, discussed with patient about icing regimen and home exercises.  Increase activity slowly.  Patient will follow up with me again in 6 weeks.    Nonallopathic problems  Decision today to treat with OMT was based on Physical Exam  After verbal consent patient was treated with HVLA, ME, FPR techniques in cervical, rib, thoracic, lumbar, and sacral  areas  Patient tolerated the procedure well with improvement in symptoms  Patient given exercises, stretches and lifestyle modifications  See medications in patient instructions if given  Patient will follow up in 4-8 weeks      The above documentation has been reviewed and is accurate and complete Judi Saa, DO       Note: This dictation was prepared with  Dragon dictation along with smaller phrase technology. Any transcriptional errors that result from this process are unintentional.

## 2020-07-23 ENCOUNTER — Ambulatory Visit: Payer: PRIVATE HEALTH INSURANCE | Admitting: Family Medicine

## 2020-07-23 ENCOUNTER — Other Ambulatory Visit: Payer: Self-pay

## 2020-07-23 ENCOUNTER — Encounter: Payer: Self-pay | Admitting: Family Medicine

## 2020-07-23 VITALS — BP 110/84 | HR 75 | Ht 66.0 in | Wt 122.0 lb

## 2020-07-23 DIAGNOSIS — M9908 Segmental and somatic dysfunction of rib cage: Secondary | ICD-10-CM | POA: Diagnosis not present

## 2020-07-23 DIAGNOSIS — M533 Sacrococcygeal disorders, not elsewhere classified: Secondary | ICD-10-CM

## 2020-07-23 DIAGNOSIS — M9902 Segmental and somatic dysfunction of thoracic region: Secondary | ICD-10-CM

## 2020-07-23 DIAGNOSIS — M9904 Segmental and somatic dysfunction of sacral region: Secondary | ICD-10-CM

## 2020-07-23 DIAGNOSIS — M9901 Segmental and somatic dysfunction of cervical region: Secondary | ICD-10-CM | POA: Diagnosis not present

## 2020-07-23 DIAGNOSIS — M9903 Segmental and somatic dysfunction of lumbar region: Secondary | ICD-10-CM | POA: Diagnosis not present

## 2020-07-23 NOTE — Assessment & Plan Note (Signed)
Continues to give patient some trouble.  Likely no significant exacerbation at this time.  Has the gabapentin, Effexor, discussed with patient about icing regimen and home exercises.  Increase activity slowly.  Patient will follow up with me again in 6 weeks.

## 2020-07-23 NOTE — Patient Instructions (Addendum)
Good to see you Try zyrtec at night for the next week Think you are doing better See me again in 6 weeks

## 2020-09-02 NOTE — Progress Notes (Signed)
[name] [address] [place] [address] Phone: [phone] Subjective:   [name], am serving as a scribe for Dr. Antoine Primas. This visit occurred during the SARS-CoV-2 public health emergency.  Safety protocols were in place, including screening questions prior to the visit, additional usage of staff PPE, and extensive cleaning of exam room while observing appropriate contact time as indicated for disinfecting solutions.   I'm seeing this patient by the request  of:  Sandford Craze, NP  CC: Back and neck pain follow-up  [id number]  Kalasia Gao is a 35 y.o. female coming in with complaint of back and neck pain. OMT 07/23/2020. Patient states that she is somewhat better than last visit. Is no longer using meloxicam. Would like to wean off of gabapentin.   Medications patient has been prescribed: Gabapentin, Meloxicam, Effexor  Taking: Yes to all of them         Reviewed prior external information including notes and imaging from previsou exam, outside providers and external EMR if available.   As well as notes that were available from care everywhere and other healthcare systems.  Past medical history, social, surgical and family history all reviewed in electronic medical record.  No pertanent information unless stated regarding to the chief complaint.   Past Medical History:  Diagnosis Date  . Abnormal heart rhythm   . History of chicken pox   . POTS (postural orthostatic tachycardia syndrome)     Allergies  Allergen Reactions  . Gluten Meal      Review of Systems:  No headache, visual changes, nausea, vomiting, diarrhea, constipation, dizziness, abdominal pain, skin rash, fevers, chills, night sweats, weight loss, swollen lymph nodes, body aches, joint swelling, chest pain, shortness of breath, mood changes. POSITIVE muscle aches  Objective  Blood pressure 100/74, pulse 94, height 5\' 8"  (1.727 m),  weight 122 lb (55.3 kg), SpO2 98 %.   General: No apparent distress alert and oriented x3 mood and affect normal, dressed appropriately.  HEENT: Pupils equal, extraocular movements intact  Respiratory: Patient's speak in full sentences and does not appear short of breath  Cardiovascular: No lower extremity edema, non tender, no erythema  Gait normal with good balance and coordination.  MSK:  Non tender with full range of motion and good stability and symmetric strength and tone of shoulders, elbows, wrist, hip, knee and ankles bilaterally.  Back -continued discomfort more over the right sacroiliac joint.  Mild tightness noted on the right side.  Coccydynia still noted  Osteopathic findings  C6 flexed rotated and side bent left T3 extended rotated and side bent left inhaled rib L5 flexed rotated and side bent left Sacrum right on right       Assessment and Plan:  SI (sacroiliac) joint dysfunction Chronic, with mild exacerbation.  Patient was to try to decrease the gabapentin and we will try to discontinue at this time.  Discussed continuing as needed though if any worsening pain again.  Patient will start a running progression.  We discussed with her to continue this on a slow basis.  Increase activity slowly.  Follow-up with me again in 6 to 8 weeks continue the Effexor.    Nonallopathic problems  Decision today to treat with OMT was based on Physical Exam  After verbal consent patient was treated with HVLA, ME, FPR techniques in cervical, rib, thoracic, lumbar, and sacral  areas  Patient tolerated the procedure well with improvement in symptoms  Patient given exercises,  stretches and lifestyle modifications  See medications in patient instructions if given  Patient will follow up in 4-8 weeks      The above documentation has been reviewed and is accurate and complete Judi Saa, DO       Note: This dictation was prepared with Dragon dictation along with smaller  phrase technology. Any transcriptional errors that result from this process are unintentional.

## 2020-09-03 ENCOUNTER — Other Ambulatory Visit: Payer: Self-pay

## 2020-09-03 ENCOUNTER — Encounter: Payer: Self-pay | Admitting: Family Medicine

## 2020-09-03 ENCOUNTER — Ambulatory Visit: Payer: PRIVATE HEALTH INSURANCE | Admitting: Family Medicine

## 2020-09-03 VITALS — BP 100/74 | HR 94 | Ht 68.0 in | Wt 122.0 lb

## 2020-09-03 DIAGNOSIS — M9904 Segmental and somatic dysfunction of sacral region: Secondary | ICD-10-CM | POA: Diagnosis not present

## 2020-09-03 DIAGNOSIS — M533 Sacrococcygeal disorders, not elsewhere classified: Secondary | ICD-10-CM

## 2020-09-03 DIAGNOSIS — M9902 Segmental and somatic dysfunction of thoracic region: Secondary | ICD-10-CM | POA: Diagnosis not present

## 2020-09-03 DIAGNOSIS — M9903 Segmental and somatic dysfunction of lumbar region: Secondary | ICD-10-CM | POA: Diagnosis not present

## 2020-09-03 DIAGNOSIS — M9901 Segmental and somatic dysfunction of cervical region: Secondary | ICD-10-CM | POA: Diagnosis not present

## 2020-09-03 DIAGNOSIS — M9908 Segmental and somatic dysfunction of rib cage: Secondary | ICD-10-CM

## 2020-09-03 NOTE — Patient Instructions (Addendum)
Ok to discontinue gabapentin Ok to try running: 1 min jog, 1 min walk for first week, 3x a week max Increase slowly! See me in 6-8 weeks

## 2020-09-03 NOTE — Assessment & Plan Note (Signed)
Chronic, with mild exacerbation.  Patient was to try to decrease the gabapentin and we will try to discontinue at this time.  Discussed continuing as needed though if any worsening pain again.  Patient will start a running progression.  We discussed with her to continue this on a slow basis.  Increase activity slowly.  Follow-up with me again in 6 to 8 weeks continue the Effexor.

## 2020-09-27 ENCOUNTER — Encounter: Payer: Self-pay | Admitting: Family Medicine

## 2020-09-28 MED ORDER — PREDNISONE 20 MG PO TABS: 40.0000 mg | ORAL_TABLET | Freq: Every day | ORAL | 0 refills | Status: DC

## 2020-09-29 ENCOUNTER — Other Ambulatory Visit: Payer: Self-pay

## 2020-09-29 MED ORDER — PREDNISONE 20 MG PO TABS: 40.0000 mg | ORAL_TABLET | Freq: Every day | ORAL | 0 refills | Status: DC

## 2020-09-30 NOTE — Progress Notes (Signed)
[name] [address] [place] [address] Phone: [phone] Subjective:   I Debbie [name] am serving as a Neurosurgeon for Dr. Antoine Primas.  This visit occurred during the SARS-CoV-2 public health emergency.  Safety protocols were in place, including screening questions prior to the visit, additional usage of staff PPE, and extensive cleaning of exam room while observing appropriate contact time as indicated for disinfecting solutions.   I'm seeing this patient by the request  of:  Sandford Craze, NP  CC: Lumbar and coccyx pain  [id number]  Debbie [name] is a 35 y.o. female coming in with complaint of back and neck pain. OMT 09/03/2020. Patient states she has been taking prednisone which has helped her pain.  Patient states that it is getting better since she has been on the prednisone but was having significant amount of pain that was stopping her from any activity.  Patient was only able to run 1 time before having the severe pain.  Starting having more pain with anything such as even daily activities.  Medications patient has been prescribed: Effexor          Reviewed prior external information including notes and imaging from previsou exam, outside providers and external EMR if available.   As well as notes that were available from care everywhere and other healthcare systems.  Past medical history, social, surgical and family history all reviewed in electronic medical record.  No pertanent information unless stated regarding to the chief complaint.   Past Medical History:  Diagnosis Date   Abnormal heart rhythm    History of chicken pox    POTS (postural orthostatic tachycardia syndrome)     Allergies  Allergen Reactions   Gluten Meal      Review of Systems:  No headache, visual changes, nausea, vomiting, diarrhea, constipation, dizziness, abdominal pain, skin rash, fevers, chills, night sweats, weight loss, swollen  lymph nodes,  joint swelling, chest pain, shortness of breath, mood changes. POSITIVE muscle aches, body aches  Objective  Blood pressure 124/80, pulse 69, height 5\' 8"  (1.727 m), weight 123 lb (55.8 kg), SpO2 99 %.   General: No apparent distress alert and oriented x3 mood and affect normal, dressed appropriately.  HEENT: Pupils equal, extraocular movements intact  Respiratory: Patient's speak in full sentences and does not appear short of breath  Cardiovascular: No lower extremity edema, non tender, no erythema  Patient's low back severe tightness noted in the paraspinal musculature of the lumbar spine.  And severe pain also noted over the piriformis muscles bilaterally.  Patient has even more intense pain with involuntary guarding over the coccyx area.  Tightness of the hamstrings bilaterally.  No significant change from previous exam.  Osteopathic findings  C2 flexed rotated and side bent right T9 extended rotated and side bent left L2 flexed rotated and side bent right Sacrum right on right Pelvic shear right      Assessment and Plan: Coccydynia Patient is having worsening pain again.  Seems to be somewhat out of proportion.  Patient did have the lumbar spine MRI that was fairly unremarkable but do feel an MRI of the pelvis to further evaluate the sacrum and the coccyx would be more beneficial at this time.  Discussed home exercises and icing regimen.  Discussed which activities to do which wants to avoid.  Icing regimen encouraged and patient encouraged to continue the prednisone for now.  Patient will follow-up after imaging and will discuss further.  Nonallopathic problems  Decision today to treat with OMT was based on Physical Exam  After verbal consent patient was treated with HVLA, ME, FPR techniques in cervical,  pelvis thoracic, lumbar, and sacral  areas  Patient tolerated the procedure well with improvement in symptoms  Patient given exercises, stretches and  lifestyle modifications  See medications in patient instructions if given  Patient has had worsening pain and we are awaiting imaging.     The above documentation has been reviewed and is accurate and complete Judi Saa, DO        Note: This dictation was prepared with Dragon dictation along with smaller phrase technology. Any transcriptional errors that result from this process are unintentional.

## 2020-10-01 ENCOUNTER — Ambulatory Visit: Payer: No Typology Code available for payment source | Admitting: Family Medicine

## 2020-10-01 ENCOUNTER — Other Ambulatory Visit: Payer: Self-pay

## 2020-10-01 ENCOUNTER — Encounter: Payer: Self-pay | Admitting: Family Medicine

## 2020-10-01 VITALS — BP 124/80 | HR 69 | Ht 68.0 in | Wt 123.0 lb

## 2020-10-01 DIAGNOSIS — M9902 Segmental and somatic dysfunction of thoracic region: Secondary | ICD-10-CM

## 2020-10-01 DIAGNOSIS — M9908 Segmental and somatic dysfunction of rib cage: Secondary | ICD-10-CM | POA: Diagnosis not present

## 2020-10-01 DIAGNOSIS — M9903 Segmental and somatic dysfunction of lumbar region: Secondary | ICD-10-CM

## 2020-10-01 DIAGNOSIS — M9904 Segmental and somatic dysfunction of sacral region: Secondary | ICD-10-CM | POA: Diagnosis not present

## 2020-10-01 DIAGNOSIS — M9901 Segmental and somatic dysfunction of cervical region: Secondary | ICD-10-CM | POA: Diagnosis not present

## 2020-10-01 DIAGNOSIS — M533 Sacrococcygeal disorders, not elsewhere classified: Secondary | ICD-10-CM | POA: Diagnosis not present

## 2020-10-01 NOTE — Patient Instructions (Addendum)
Good to see you  Finish up prednisone then back to meloxicam  MRI pelvis without to evaluate sacrum and coccyx  See me again in 5 weeks

## 2020-10-01 NOTE — Assessment & Plan Note (Signed)
Patient is having worsening pain again.  Seems to be somewhat out of proportion.  Patient did have the lumbar spine MRI that was fairly unremarkable but do feel an MRI of the pelvis to further evaluate the sacrum and the coccyx would be more beneficial at this time.  Discussed home exercises and icing regimen.  Discussed which activities to do which wants to avoid.  Icing regimen encouraged and patient encouraged to continue the prednisone for now.  Patient will follow-up after imaging and will discuss further.

## 2020-10-07 ENCOUNTER — Other Ambulatory Visit: Payer: Self-pay | Admitting: Family Medicine

## 2020-10-07 ENCOUNTER — Encounter: Payer: Self-pay | Admitting: Family Medicine

## 2020-10-08 ENCOUNTER — Other Ambulatory Visit: Payer: Self-pay

## 2020-10-08 MED ORDER — TIZANIDINE HCL 2 MG PO TABS: 2.0000 mg | ORAL_TABLET | Freq: Every day | ORAL | 0 refills | Status: DC

## 2020-10-09 ENCOUNTER — Ambulatory Visit (INDEPENDENT_AMBULATORY_CARE_PROVIDER_SITE_OTHER): Payer: No Typology Code available for payment source | Admitting: Family

## 2020-10-09 ENCOUNTER — Other Ambulatory Visit: Payer: Self-pay

## 2020-10-09 ENCOUNTER — Other Ambulatory Visit: Payer: Self-pay | Admitting: Family Medicine

## 2020-10-09 VITALS — BP 136/96 | HR 69 | Temp 98.6°F | Resp 16 | Wt 122.0 lb

## 2020-10-09 DIAGNOSIS — M546 Pain in thoracic spine: Secondary | ICD-10-CM | POA: Insufficient documentation

## 2020-10-09 DIAGNOSIS — Z23 Encounter for immunization: Secondary | ICD-10-CM

## 2020-10-09 MED ORDER — DICLOFENAC SODIUM 75 MG PO TBEC: 75.0000 mg | DELAYED_RELEASE_TABLET | Freq: Two times a day (BID) | ORAL | 1 refills | Status: DC | PRN

## 2020-10-09 NOTE — Patient Instructions (Signed)
Stop meloxicam, instead you may use diclofenac twice daily as needed. Call if symptoms worsen or if symptoms do not continue to improve.

## 2020-10-09 NOTE — Assessment & Plan Note (Signed)
Seems to be improving. She notes eye twitch when she takes meloxicam multiple days in a row and is inquiring about another NSAID. Will give trial of diclofenac 75mg  twice daily.  She will use sparingly and let know if her pain worsens or if it does not continue to improve.

## 2020-10-09 NOTE — Progress Notes (Signed)
Subjective:   By signing my name below, I, Shehryar Baig, attest that this documentation has been prepared under the direction and in the presence of Sandford Craze NP. 10/09/2020    Patient ID: Debbie [name], female    DOB: January 18, 1986, 35 y.o.   MRN: [id number]  Chief Complaint  Patient presents with   Back Pain    New upper back pain     HPI Patient is in today for a office visit. She complains of burning nerve pain in her left shoulder that goes down into her back. Her pain started after she tuned while laying down in bed before sleeping. Her pain has improved since is started. Her burning pain went away but her pain feels tight now. She let Dr. [name] know of her pain and he recommend she see her PCP. She has muscle relaxer medication but has not used it because it makes her drowsy. She has 100 mg gabapentin PO but only takes 1 tablet because 2 will make her too drowsy throughout the day.   Immunizations- Her tetanus vaccine is due this upcomming October and she is requesting to get it early. She has 3 moderna Covid-19 vaccines. Exercise- She started to swim instead of running for the past year and reports doing well.  Pelvis pain- She continues seeing Dr. [name] to manage her pelvic pain and has a pelvic MRI scheduled.   Health Maintenance Due  Topic Date Due   Hepatitis C Screening  Never done   COVID-19 Vaccine (3 - Booster for Moderna series) 12/08/2019    Past Medical History:  Diagnosis Date   Abnormal heart rhythm    History of chicken pox    POTS (postural orthostatic tachycardia syndrome)     Past Surgical History:  Procedure Laterality Date   TONSILLECTOMY AND ADENOIDECTOMY  1997   WISDOM TOOTH EXTRACTION     all 4    Family History  Problem Relation Age of Onset   Osteoporosis Mother    Hyperlipidemia Father    Heart disease Father        CAD/defibrillator age 61   Hypertension Father    Diabetes Father        type 2   Colon cancer Neg Hx     Esophageal cancer Neg Hx    Stomach cancer Neg Hx     Social History   Socioeconomic History   Marital status: Single    Spouse name: Not on file   Number of children: 0   Years of education: Not on file   Highest education level: Not on file  Occupational History   Occupation: university professor  Tobacco Use   Smoking status: Never   Smokeless tobacco: Never  Vaping Use   Vaping Use: Never used  Substance and Sexual Activity   Alcohol use: No   Drug use: No   Sexual activity: Yes    Birth control/protection: None  Other Topics Concern   Not on file  Social History Narrative   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 180 capsule 0   tiZANidine (ZANAFLEX) 2 MG tablet Take 1 tablet (2 mg total) by mouth at bedtime. 30 tablet 0   venlafaxine XR (EFFEXOR-XR) 150 MG 24 hr capsule TAKE 1 CAPSULE (150 MG TOTAL) BY MOUTH DAILY WITH BREAKFAST. 90 capsule 2   meloxicam (MOBIC) 15 MG tablet TAKE 1 TABLET (15 MG TOTAL) BY MOUTH DAILY. 30 tablet 0   predniSONE (DELTASONE) 20 MG tablet Take 2 tablets (40 mg total) by mouth daily with breakfast. 10 tablet 0   No facility-administered medications prior to visit.    Allergies  Allergen Reactions   Gluten Meal     Review of Systems  Musculoskeletal:  Positive for back pain and joint pain (Shoulder).      Objective:    Physical Exam Constitutional:      General: She is not in acute distress.    Appearance: Normal  appearance. She is not ill-appearing.  HENT:     Head: Normocephalic and atraumatic.     Right Ear: External ear normal.     Left Ear: External ear normal.  Eyes:     Extraocular Movements: Extraocular movements intact.     Pupils: Pupils are equal, round, and reactive to light.  Cardiovascular:     Rate and Rhythm: Normal rate and regular rhythm.     Pulses: Normal pulses.     Heart sounds: Normal heart sounds. No murmur heard.   No gallop.  Pulmonary:     Effort: Pulmonary effort is normal. No respiratory distress.     Breath sounds: Normal breath sounds. No wheezing, rhonchi or rales.  Musculoskeletal:     Thoracic back: Tenderness (Tenderness to palpation left and right mid thoracic) present.  Skin:    General: Skin is warm and dry.  Neurological:     Mental Status: She is alert and oriented to person, place, and time.  Psychiatric:        Behavior: Behavior normal.    BP (!) 136/96 (BP Location: Right Arm, Patient Position: Sitting, Cuff Size: Small)   Pulse 69   Temp 98.6 F (37 C) (Oral)   Resp 16   Wt 122 lb (55.3 kg)   SpO2 100%   BMI 18.55 kg/m  Wt Readings from Last 3 Encounters:  10/09/20 122 lb (55.3 kg)  10/01/20 123 lb (55.8 kg)  09/03/20 122 lb (55.3 kg)       Assessment & Plan:   Problem List Items Addressed This Visit       Unprioritized   Acute thoracic back pain - Primary    Seems to be improving. She notes eye twitch when she takes meloxicam multiple days in a row and is inquiring about another NSAID. Will give trial of diclofenac 75mg  twice daily.  She will use sparingly and let know if her pain worsens or if it does not continue to improve.       Relevant Medications   diclofenac (VOLTAREN) 75 MG EC tablet   Other Visit Diagnoses     Need for prophylactic vaccination with tetanus-diphtheria (Td)       Relevant Orders   Td : Tetanus/diphtheria >7yo Preservative  free (Completed)        Meds ordered this encounter  Medications    diclofenac (VOLTAREN) 75 MG EC tablet    Sig: Take 1 tablet (75 mg total) by mouth  2 (two) times daily as needed.    Dispense:  30 tablet    Refill:  1    Order Specific Question:   Supervising Provider    Answer:   Danise Edge A [[id number]]    I, Sandford Craze NP, personally preformed the services described in this documentation.  All medical record entries made by the scribe were at my direction and in my presence.  I have reviewed the chart and discharge instructions (if applicable) and agree that the record reflects my personal performance and is accurate and complete. 10/09/2020   I,Shehryar Baig,acting as a scribe for Lemont Fillers, NP.,have documented all relevant documentation on the behalf of Lemont Fillers, NP,as directed by  Lemont Fillers, NP while in the presence of Lemont Fillers, NP.   Lemont Fillers, NP

## 2020-10-12 ENCOUNTER — Other Ambulatory Visit: Payer: Self-pay

## 2020-10-12 ENCOUNTER — Ambulatory Visit
Admission: RE | Admit: 2020-10-12 | Discharge: 2020-10-12 | Disposition: A | Discharge status: Home / Self Care | Payer: No Typology Code available for payment source | Source: Ambulatory Visit | Attending: Family Medicine | Admitting: Family Medicine

## 2020-10-12 DIAGNOSIS — M533 Sacrococcygeal disorders, not elsewhere classified: Secondary | ICD-10-CM

## 2020-10-12 IMAGING — MR MR PELVIS W/O CM
4 of 5 series · 30 of 48 positions shown · non-contrast
Comparison: Sacrum and coccyx x-rays dated [DATE].

CLINICAL DATA: Chronic posterior pelvic and sacral pain. No injury
or prior surgery.

EXAM:
MRI PELVIS WITHOUT CONTRAST
TECHNIQUE: Multiplanar multisequence MR imaging of the pelvis was performed. No
intravenous contrast was administered.

[Series 3: T2 fat-sat · sagittal · 4.0mm · 0.47mm/px · 8 of 63 slices shown]
[im 1/63]
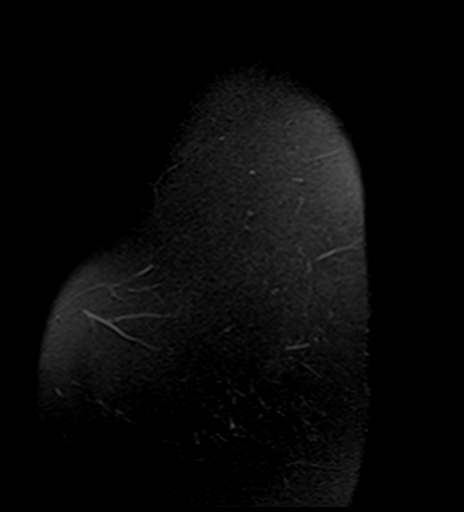
[im 10/63]
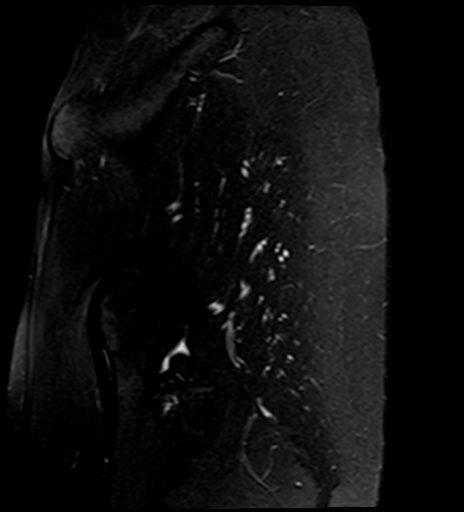
[im 20/63]
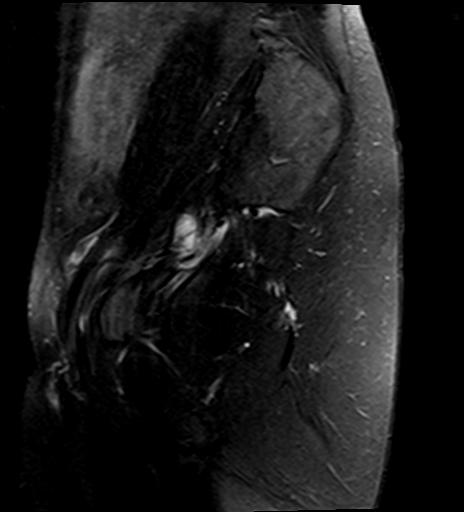
[im 29/63]
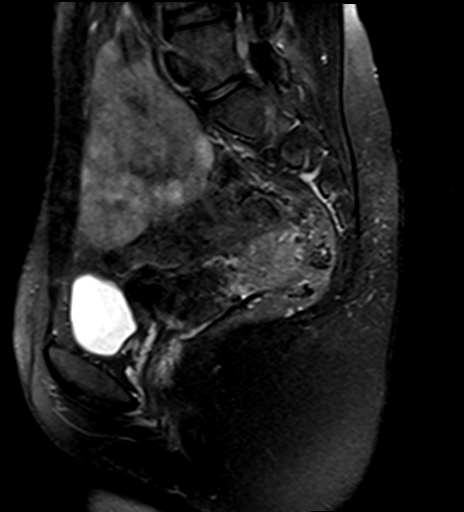
[im 34/63]
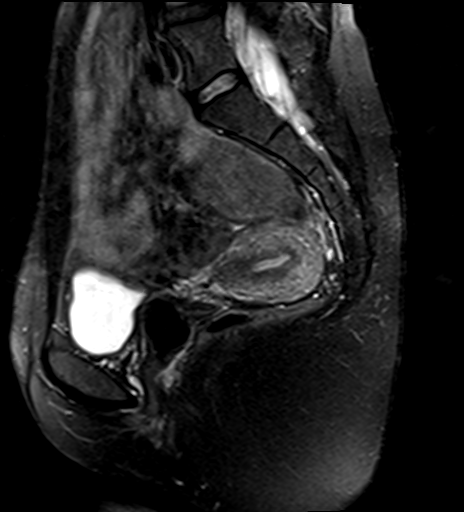
[im 43/63]
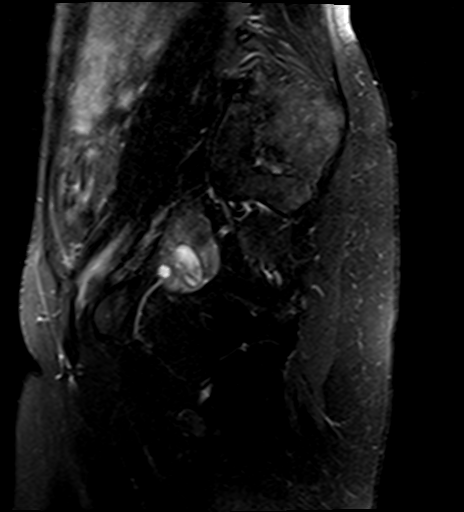
[im 53/63]
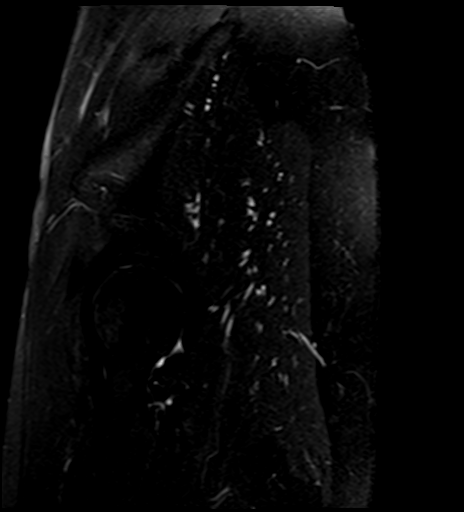
[im 63/63]
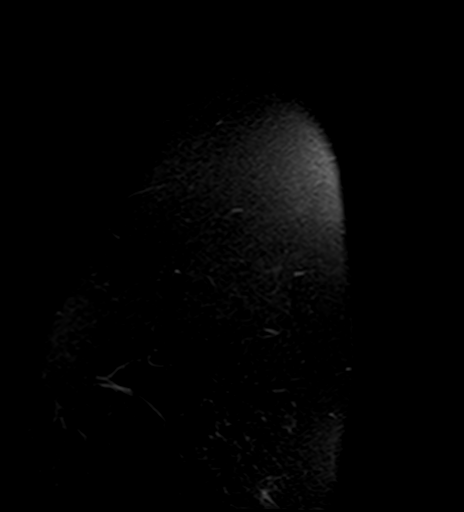

[Series 4: T1 · coronal · 4.0mm · 1.56mm/px · 7 of 34 slices shown (1 of 2)]
[im 1/34]
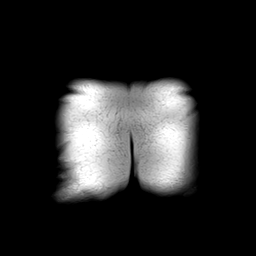
[im 6/34]
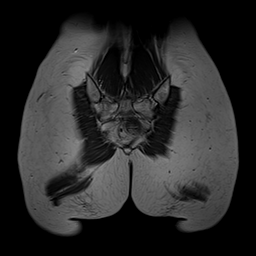
[im 12/34]
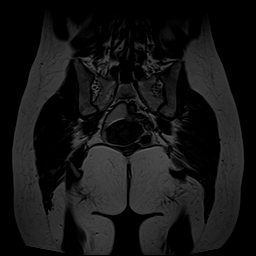
[im 17/34]
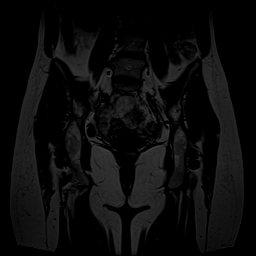
[im 23/34]
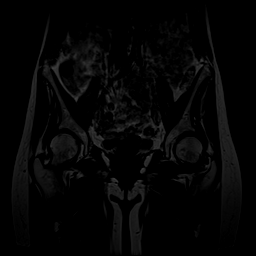
[im 28/34]
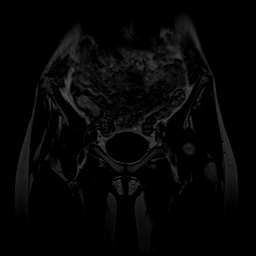
[im 34/34]
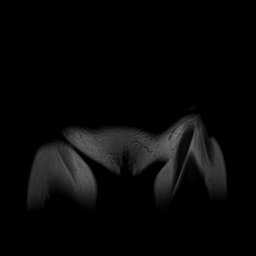

[Series 5: STIR · coronal · 4.0mm · 1.56mm/px · 7 of 34 slices shown]
[im 1/34]
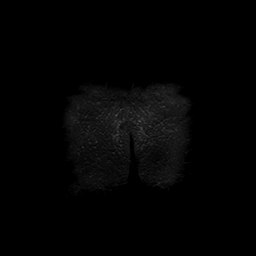
[im 6/34]
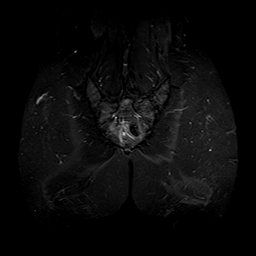
[im 12/34]
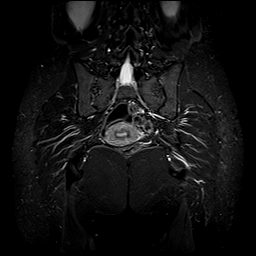
[im 17/34]
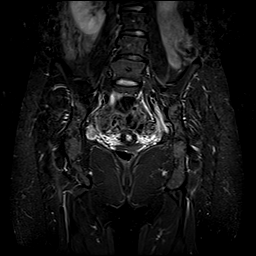
[im 23/34]
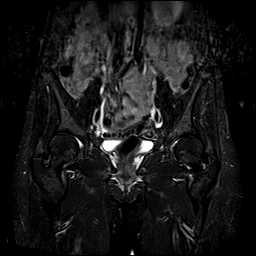
[im 28/34]
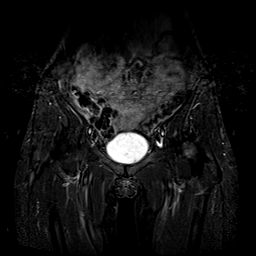
[im 34/34]
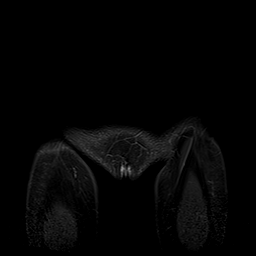

[Series 6: T1 · axial · 4.0mm · 0.62mm/px · z∈[-139,+96]mm · 8 of 48 slices shown (2 of 2)]
[im 1/48]
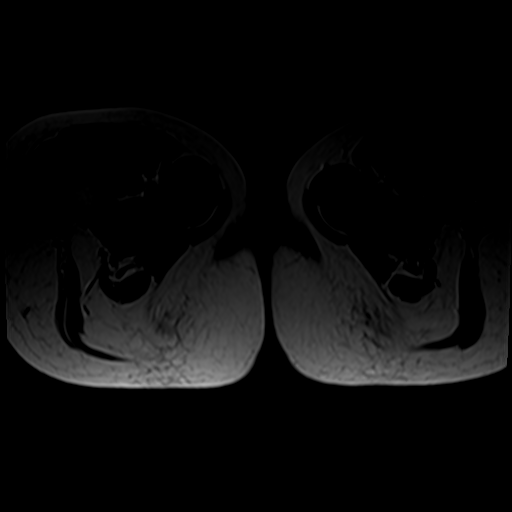
[im 6/48]
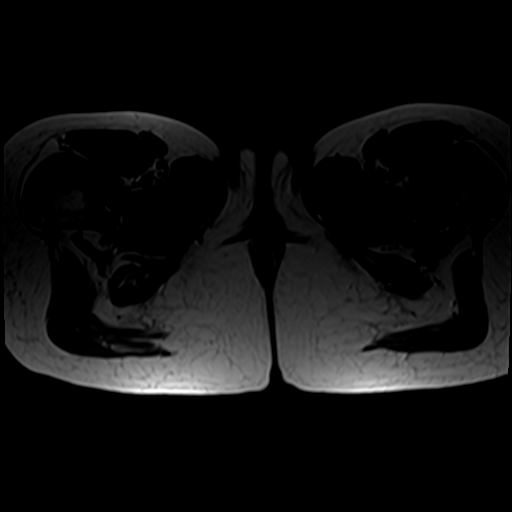
[im 16/48]
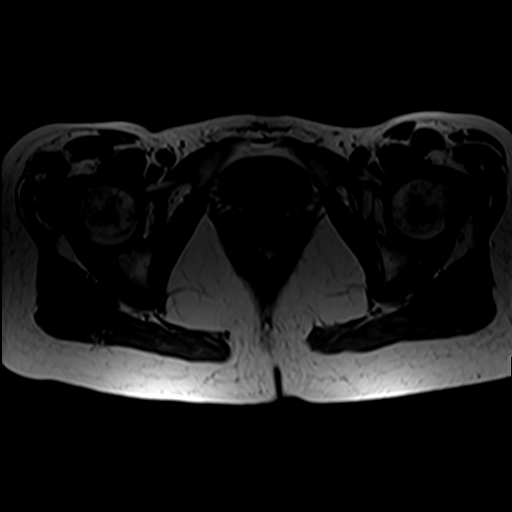
[im 21/48]
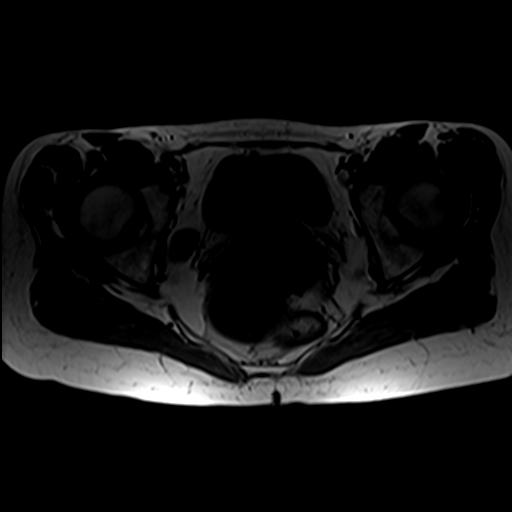
[im 27/48]
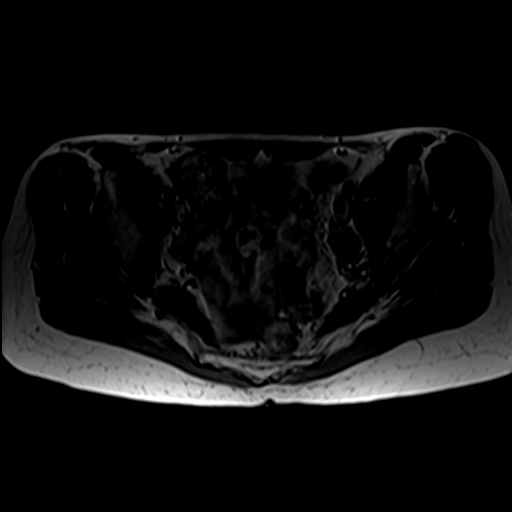
[im 32/48]
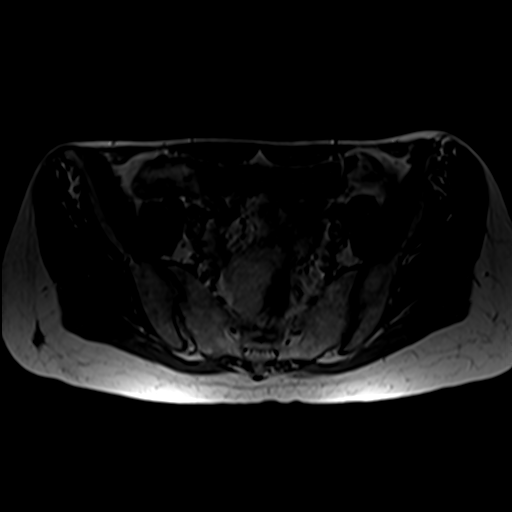
[im 42/48]
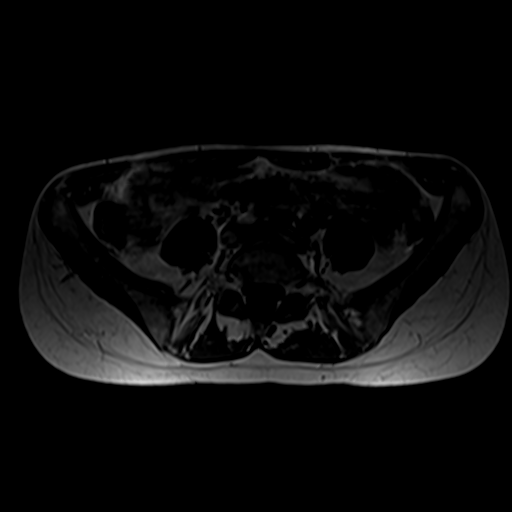
[im 48/48]
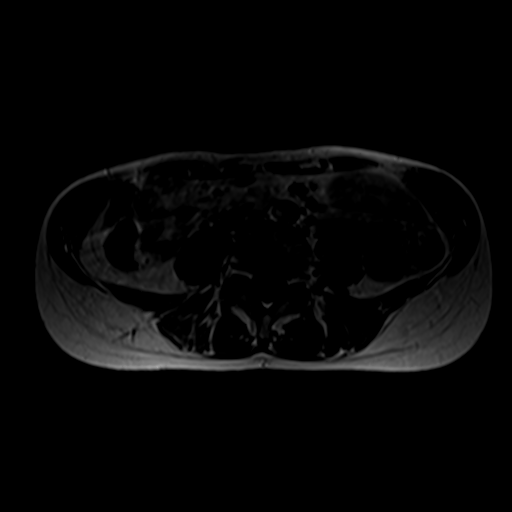

[30 of 48 positions shown; findings below may reference images not displayed]

FINDINGS: Bones: There is no evidence of acute fracture, dislocation or
avascular necrosis. No focal bone lesion. The visualized sacroiliac
joints and symphysis pubis appear normal.

Articular cartilage and labrum

Articular cartilage: No focal chondral defect or subchondral signal
abnormality identified.

Labrum: Grossly intact, although evaluation is limited due to lack
of intra-articular fluid. No paralabral abnormality.

Joint or bursal effusion

Joint effusion: No significant hip joint effusion.

Bursae: No focal periarticular fluid collection.

Muscles and tendons

Muscles and tendons: The visualized gluteus, hamstring and iliopsoas
tendons appear normal. No muscle edema or atrophy.

Other findings

Miscellaneous: The visualized internal pelvic contents appear
unremarkable. Tampon in the vagina. Trace free fluid in the pelvis
is likely physiologic.
IMPRESSION: 1. Normal MRI of the pelvis. No explanation for the patient's
symptoms.

## 2020-10-16 ENCOUNTER — Other Ambulatory Visit: Payer: Self-pay

## 2020-10-16 ENCOUNTER — Encounter: Payer: Self-pay | Admitting: Family Medicine

## 2020-10-16 ENCOUNTER — Ambulatory Visit: Payer: No Typology Code available for payment source | Admitting: Family Medicine

## 2020-10-16 VITALS — BP 108/78 | HR 105 | Ht 68.0 in | Wt 122.0 lb

## 2020-10-16 DIAGNOSIS — M546 Pain in thoracic spine: Secondary | ICD-10-CM

## 2020-10-16 DIAGNOSIS — M9903 Segmental and somatic dysfunction of lumbar region: Secondary | ICD-10-CM | POA: Diagnosis not present

## 2020-10-16 DIAGNOSIS — M533 Sacrococcygeal disorders, not elsewhere classified: Secondary | ICD-10-CM | POA: Diagnosis not present

## 2020-10-16 DIAGNOSIS — M9901 Segmental and somatic dysfunction of cervical region: Secondary | ICD-10-CM | POA: Diagnosis not present

## 2020-10-16 DIAGNOSIS — M9902 Segmental and somatic dysfunction of thoracic region: Secondary | ICD-10-CM

## 2020-10-16 DIAGNOSIS — M9908 Segmental and somatic dysfunction of rib cage: Secondary | ICD-10-CM | POA: Diagnosis not present

## 2020-10-16 DIAGNOSIS — M9904 Segmental and somatic dysfunction of sacral region: Secondary | ICD-10-CM

## 2020-10-16 NOTE — Progress Notes (Signed)
[name] [address] [place] [address] Phone: [phone] Subjective:   Debbie [name], am serving as a scribe for Dr. Antoine Primas.   I'm seeing this patient by the request  of:  Sandford Craze, NP  CC:   [id number]  Debbie [name] is a 35 y.o. female coming in with complaint of back and neck pain. OMT 10/01/2020. Patient states week or two ago has had some nerve pain in the right shoulder and seen her PCP for the pain, has stopped the meloxicam was given the diclofenac but wanted to talk about it before starting it.   Medications patient has been prescribed: Zanaflex, Effexor, Gabapentin  Taking:         Reviewed prior external information including notes and imaging from previsou exam, outside providers and external EMR if available.   As well as notes that were available from care everywhere and other healthcare systems.  Past medical history, social, surgical and family history all reviewed in electronic medical record.  No pertanent information unless stated regarding to the chief complaint.   Past Medical History:  Diagnosis Date   Abnormal heart rhythm    History of chicken pox    POTS (postural orthostatic tachycardia syndrome)     Allergies  Allergen Reactions   Gluten Meal      Review of Systems:  No headache, visual changes, nausea, vomiting, diarrhea, constipation, dizziness, abdominal pain, skin rash, fevers, chills, night sweats, weight loss, swollen lymph nodes, body aches, joint swelling, chest pain, shortness of breath, mood changes. POSITIVE muscle aches  Objective  There were no vitals taken for this visit.   General: No apparent distress alert and oriented x3 mood and affect normal, dressed appropriately.  HEENT: Pupils equal, extraocular movements intact  Respiratory: Patient's speak in full sentences and does not appear short of breath  Cardiovascular: No lower extremity edema, non  tender, no erythema  Neuro: Cranial nerves II through XII are intact, neurovascularly intact in all extremities with 2+ DTRs and 2+ pulses.  Gait normal with good balance and coordination.  MSK:  Non tender with full range of motion and good stability and symmetric strength and tone of shoulders, elbows, wrist, hip, knee and ankles bilaterally.  Back - Normal skin, Spine with normal alignment and no deformity.  No tenderness to vertebral process palpation.  Paraspinous muscles are not tender and without spasm.   Range of motion is full at neck and lumbar sacral regions  Osteopathic findings  C2 flexed rotated and side bent right C6 flexed rotated and side bent left T3 extended rotated and side bent right inhaled rib T9 extended rotated and side bent left L2 flexed rotated and side bent right Sacrum right on right       Assessment and Plan:    Nonallopathic problems  Decision today to treat with OMT was based on Physical Exam  After verbal consent patient was treated with HVLA, ME, FPR techniques in cervical, rib, thoracic, lumbar, and sacral  areas  Patient tolerated the procedure well with improvement in symptoms  Patient given exercises, stretches and lifestyle modifications  See medications in patient instructions if given  Patient will follow up in 4-8 weeks      The above documentation has been reviewed and is accurate and complete Wilford Grist       Note: This dictation was prepared with Dragon dictation along with smaller phrase technology. Any transcriptional errors that result from this process are  unintentional.

## 2020-10-16 NOTE — Patient Instructions (Addendum)
Good to see you  Swim MWF Machine weights Tuesday for chest shoulders biceps and triceps  Machine weights Thursday for back and lower body 3 exercises each  keep the weight low at first See me again in 5-6 weeks

## 2020-10-16 NOTE — Assessment & Plan Note (Signed)
Acute thoracic pain.  Was found to the provider previously has been slowly improving but did respond extremely well to the manipulation.  Has the diclofenac that she can continue if necessary.  Continuing to stay active otherwise.  Follow-up with me again as scheduled in 4 to 6 weeks

## 2020-10-16 NOTE — Progress Notes (Signed)
[name] [address] [place] [address] Phone: [phone] Subjective:    I'm seeing this patient by the request  of:  Sandford Craze, NP  CC: Back and neck pain follow-up  [id number]  Debbie [name] is a 35 y.o. female coming in with complaint of back and neck pain Patient states more about upper back pain in the lower back pain at this time.  Patient feels that the manipulation this time seem to be a little bit better.  Discussed with patient icing regimen and home exercises.  Continue to stay active otherwise.           Reviewed prior external information including notes and imaging from previsou exam, outside providers and external EMR if available.  This includes patient's most recent imaging which includes an MRI of the pelvis without contrast.  Patient's MRI of the pelvis was independently visualized by me showing no significant arthritic changes noted.  No musculoskeletal findings that would be consistent with patient's pain.  As well as notes that were available from care everywhere and other healthcare systems.  Past medical history, social, surgical and family history all reviewed in electronic medical record.  No pertanent information unless stated regarding to the chief complaint.   Past Medical History:  Diagnosis Date   Abnormal heart rhythm    History of chicken pox    POTS (postural orthostatic tachycardia syndrome)     Allergies  Allergen Reactions   Gluten Meal      Review of Systems:  No headache, visual changes, nausea, vomiting, diarrhea, constipation, dizziness, abdominal pain, skin rash, fevers, chills, night sweats, weight loss, swollen lymph nodes, body aches, joint swelling, chest pain, shortness of breath, mood changes. POSITIVE muscle aches  Objective  Blood pressure 108/78, pulse (!) 105, height 5\' 8"  (1.727 m), weight 122 lb (55.3 kg), SpO2 99 %.   General: No apparent distress alert  and oriented x3 mood and affect normal, dressed appropriately.  HEENT: Pupils equal, extraocular movements intact  Respiratory: Patient's speak in full sentences and does not appear short of breath  Cardiovascular: No lower extremity edema, non tender, no erythema  Upper back on the left side does have some mild discomfort impingement noted.  Seems to be more of a deep musculature.  Seems to be acute in onset.  Continued tightness still noted in the paraspinal musculature of the thoracolumbar area as well as the inferior aspect of the inferior scapula.  Hypermobility noted to multiple joints  Osteopathic findings  C6 flexed rotated and side bent left T3 extended rotated and side bent left inhaled rib T9 extended rotated and side bent left L1 flexed rotated and side bent left Sacrum right on right      Assessment and Plan:  Coccydynia Imaging has been unremarkable which is a good thing.  I do think it is more nerve impingement.  Seems to be lower intractable.  If continuing to have pain we will consider the possibility of advanced manipulation techniques were warned of the potential for this.  In addition of this we discussed with patient about home exercises, icing regimen, we discussed the possibility of injections as well.  We will consider at follow-up in 6 weeks.  Acute thoracic back pain Acute thoracic pain.  Was found to the provider previously has been slowly improving but did respond extremely well to the manipulation.  Has the diclofenac that she can continue if necessary.  Continuing to stay active otherwise.  Follow-up  with me again as scheduled in 4 to 6 weeks   Nonallopathic problems  Decision today to treat with OMT was based on Physical Exam  After verbal consent patient was treated with HVLA, ME, FPR techniques in cervical, rib, thoracic, lumbar, and sacral  areas  Patient tolerated the procedure well with improvement in symptoms  Patient given exercises, stretches  and lifestyle modifications  See medications in patient instructions if given  Patient will follow up in 4-8 weeks      The above documentation has been reviewed and is accurate and complete Judi Saa, DO'        Note: This dictation was prepared with Dragon dictation along with smaller phrase technology. Any transcriptional errors that result from this process are unintentional.

## 2020-10-16 NOTE — Assessment & Plan Note (Signed)
Imaging has been unremarkable which is a good thing.  I do think it is more nerve impingement.  Seems to be lower intractable.  If continuing to have pain we will consider the possibility of advanced manipulation techniques were warned of the potential for this.  In addition of this we discussed with patient about home exercises, icing regimen, we discussed the possibility of injections as well.  We will consider at follow-up in 6 weeks.

## 2020-10-21 ENCOUNTER — Ambulatory Visit: Payer: PRIVATE HEALTH INSURANCE | Admitting: Family Medicine

## 2020-10-26 ENCOUNTER — Encounter: Payer: Self-pay | Admitting: Family Medicine

## 2020-10-26 ENCOUNTER — Ambulatory Visit: Payer: No Typology Code available for payment source | Admitting: Family Medicine

## 2020-10-26 ENCOUNTER — Ambulatory Visit (INDEPENDENT_AMBULATORY_CARE_PROVIDER_SITE_OTHER): Payer: No Typology Code available for payment source

## 2020-10-26 ENCOUNTER — Other Ambulatory Visit: Payer: Self-pay

## 2020-10-26 VITALS — BP 104/80 | HR 87 | Ht 68.0 in | Wt 122.0 lb

## 2020-10-26 DIAGNOSIS — R0781 Pleurodynia: Secondary | ICD-10-CM | POA: Diagnosis not present

## 2020-10-26 DIAGNOSIS — M9902 Segmental and somatic dysfunction of thoracic region: Secondary | ICD-10-CM

## 2020-10-26 DIAGNOSIS — M9908 Segmental and somatic dysfunction of rib cage: Secondary | ICD-10-CM | POA: Diagnosis not present

## 2020-10-26 DIAGNOSIS — M546 Pain in thoracic spine: Secondary | ICD-10-CM

## 2020-10-26 IMAGING — DX DG RIBS W/ CHEST 3+V*L*
3 series · 3 of 3 positions shown · non-contrast
Comparison: No prior.

CLINICAL DATA: Left rib pain.  No known injury.

EXAM:
LEFT RIBS AND CHEST - 3+ VIEW

[chest pa]
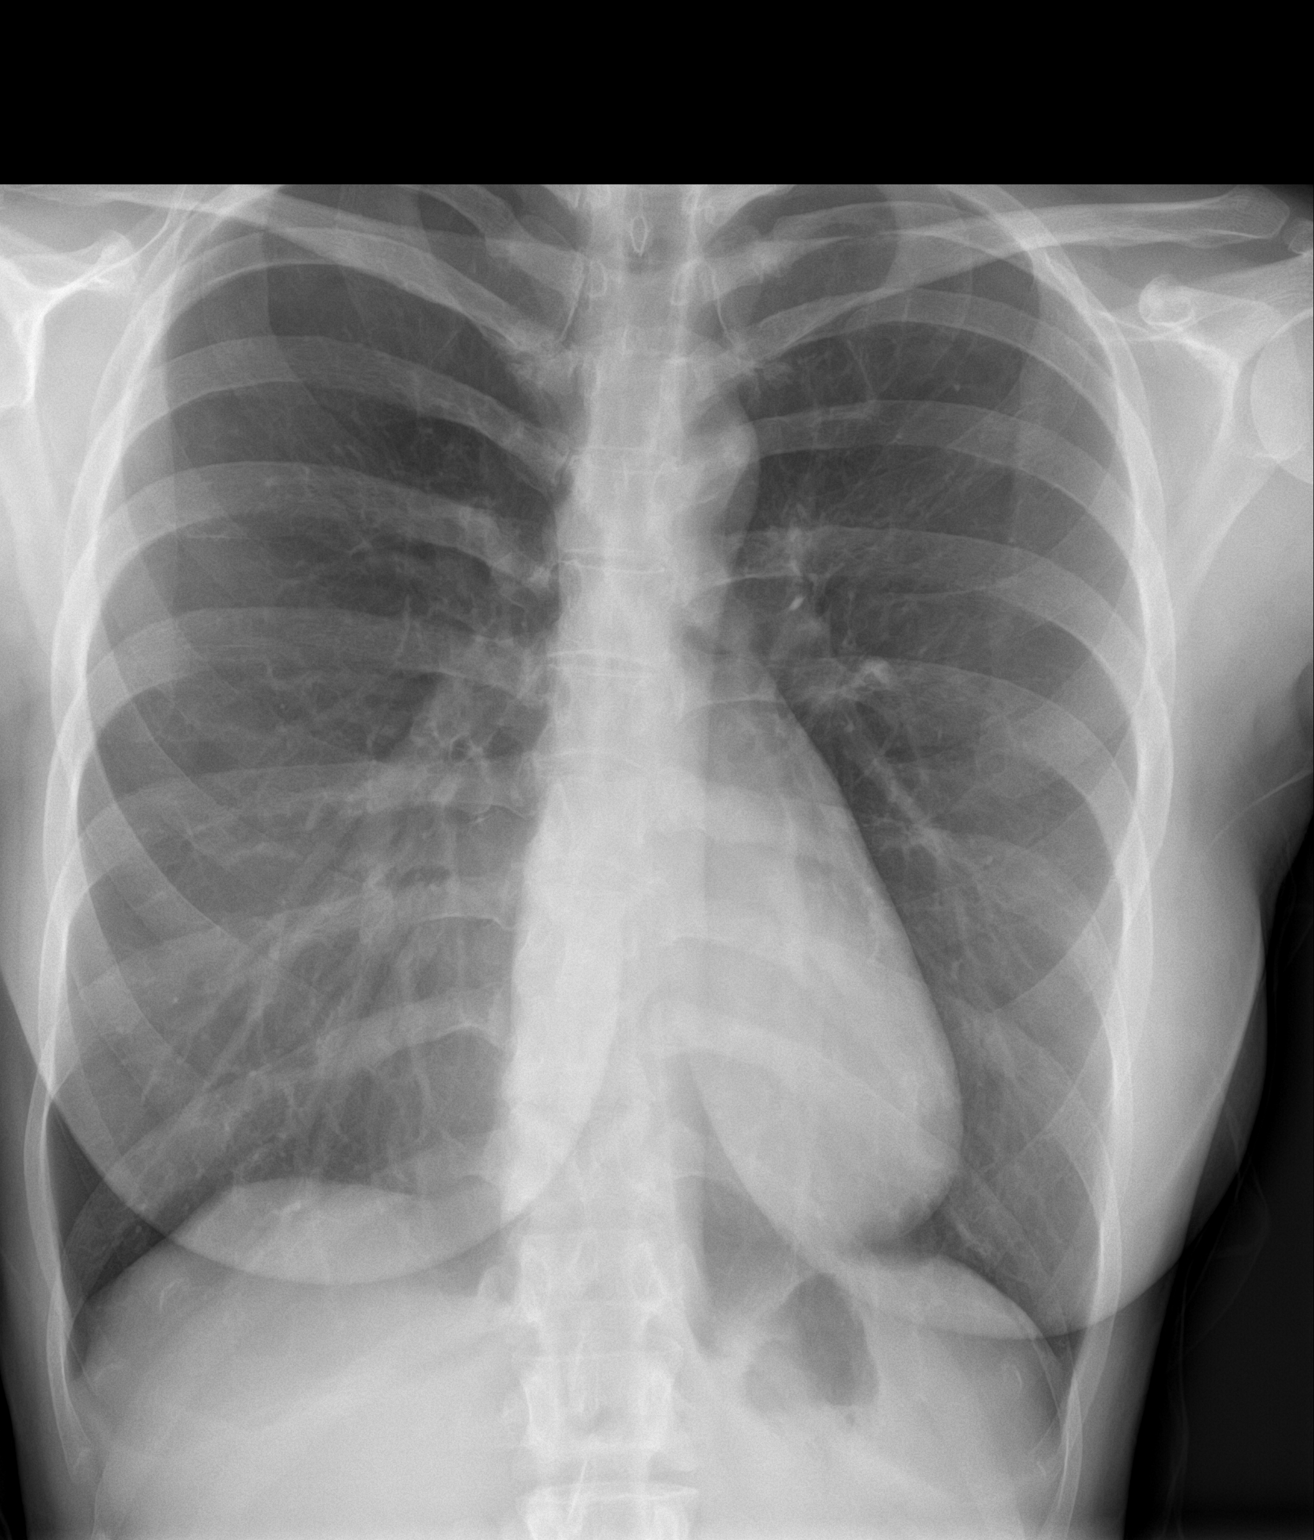

[rib ap]
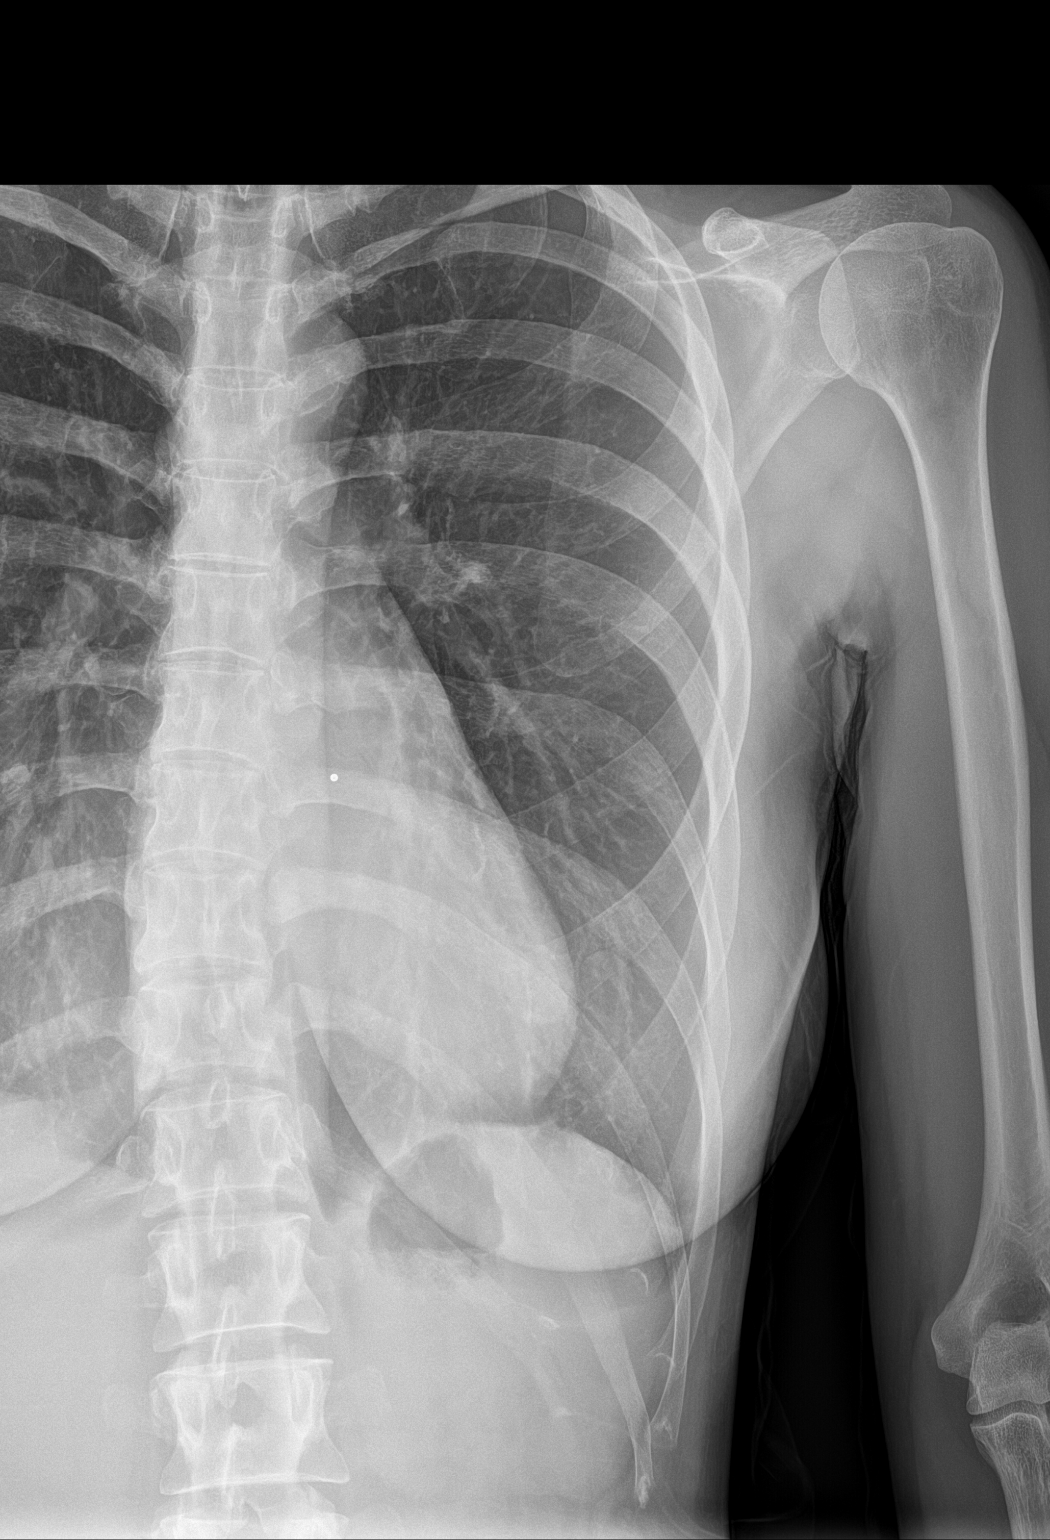

[rib obl]
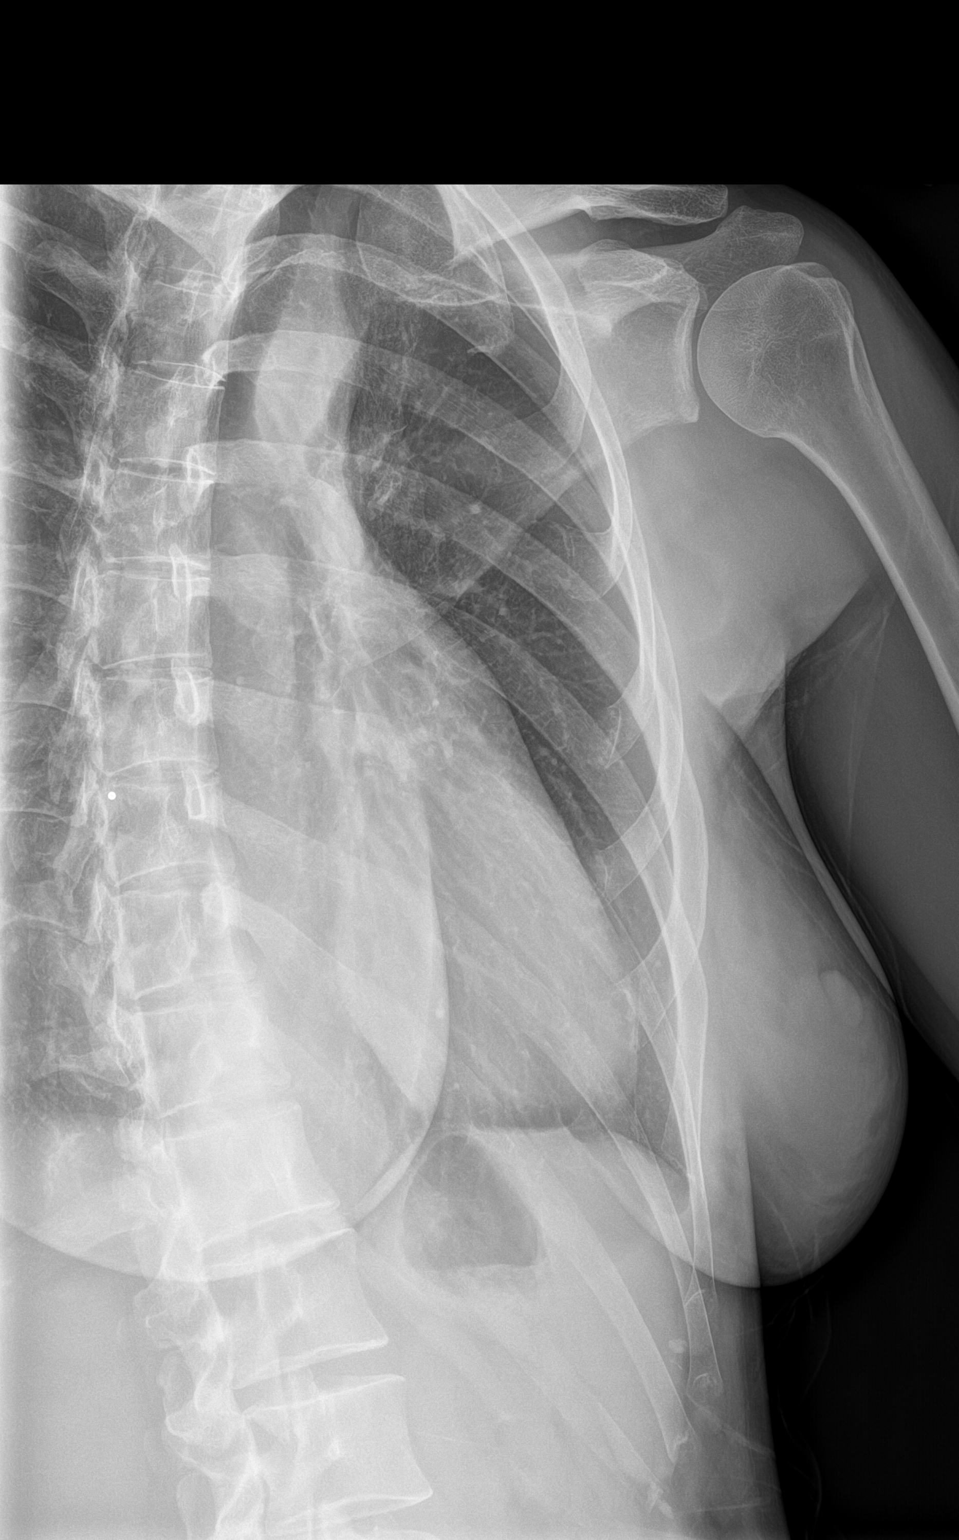

[3 of 3 positions shown; findings below may reference images not displayed]

FINDINGS: No acute or focal bony abnormality. No evidence of fracture. No
acute cardiopulmonary disease. No pneumothorax.
IMPRESSION: No acute or focal bony abnormality.  No pneumothorax.

## 2020-10-26 NOTE — Progress Notes (Signed)
[name] [address] [place] [address] Phone: [phone] Subjective:   [name], am serving as a scribe for Dr. Antoine Primas. This visit occurred during the SARS-CoV-2 public health emergency.  Safety protocols were in place, including screening questions prior to the visit, additional usage of staff PPE, and extensive cleaning of exam room while observing appropriate contact time as indicated for disinfecting solutions.    I'm seeing this patient by the request  of:  Sandford Craze, NP  CC: Left scapular pain  [id number]  Debbie [name] is a 35 y.o. female coming in with complaint of L scapular pain. Patient waited 4 days before swimming per recommendation. Experienced burning pain in thoracic spine. Patient is not wanting to lift yet due to pain.  Patient is concerned because when she starts to increase activity since having more discomfort and pain.  Patient has not responded well little bit to the Zanaflex but also is taking the diclofenac that does help when she takes it but would like to not have to take as much.  Low back pain seems to be doing relatively well overall.  No significant worsening of the pain.      Past Medical History:  Diagnosis Date   Abnormal heart rhythm    History of chicken pox    POTS (postural orthostatic tachycardia syndrome)    Past Surgical History:  Procedure Laterality Date   TONSILLECTOMY AND ADENOIDECTOMY  1997   WISDOM TOOTH EXTRACTION     all 4   Social History   Socioeconomic History   Marital status: Single    Spouse name: Not on file   Number of children: 0   Years of education: Not on file   Highest education level: Not on file  Occupational History   Occupation: university professor  Tobacco Use   Smoking status: Never   Smokeless tobacco: Never  Vaping Use   Vaping Use: Never used  Substance and Sexual Activity   Alcohol use: No   Drug use: No   Sexual  activity: Yes    Birth control/protection: None  Other Topics Concern   Not on file  Social History Narrative   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   Single   Lives in apartment   Enjoys resting, running, reading      Social Determinants of Health   Financial Resource Strain: Not on file  Food Insecurity: Not on file  Transportation Needs: Not on file  Physical Activity: Not on file  Stress: Not on file  Social Connections: Not on file   Allergies  Allergen Reactions   Gluten Meal    Family History  Problem Relation Age of Onset   Osteoporosis Mother    Hyperlipidemia Father    Heart disease Father        CAD/defibrillator age 12   Hypertension Father    Diabetes Father        type 2   Colon cancer Neg Hx    Esophageal cancer Neg Hx    Stomach cancer Neg Hx        Current Outpatient Medications (Analgesics):    diclofenac (VOLTAREN) 75 MG EC tablet, Take 1 tablet (75 mg total) by mouth 2 (two) times daily as needed.   Current Outpatient Medications (Other):    gabapentin (NEURONTIN) 100 MG capsule, Take 2 capsules (200 mg total) by mouth at bedtime.   tiZANidine (ZANAFLEX) 2 MG tablet, Take 1 tablet (2  mg total) by mouth at bedtime.   venlafaxine XR (EFFEXOR-XR) 150 MG 24 hr capsule, TAKE 1 CAPSULE (150 MG TOTAL) BY MOUTH DAILY WITH BREAKFAST.   Reviewed prior external information including notes and imaging from  primary care provider As well as notes that were available from care everywhere and other healthcare systems.  Past medical history, social, surgical and family history all reviewed in electronic medical record.  No pertanent information unless stated regarding to the chief complaint.   Review of Systems:  No headache, visual changes, nausea, vomiting, diarrhea, constipation, dizziness, abdominal pain, skin rash, fevers, chills, night sweats, weight loss, swollen lymph nodes, body aches, joint swelling, chest pain, shortness of breath, mood  changes. POSITIVE muscle aches  Objective  Blood pressure 104/80, pulse 87, height 5\' 8"  (1.727 m), weight 122 lb (55.3 kg), last menstrual period 10/09/2020, SpO2 98 %.   General: No apparent distress alert and oriented x3 mood and affect normal, dressed appropriately.  HEENT: Pupils equal, extraocular movements intact  Respiratory: Patient's speak in full sentences and does not appear short of breath  Cardiovascular: No lower extremity edema, non tender, no erythema  Gait normal with good balance and coordination.  MSK: Low back exam there is very mild loss of lordosis.  Tightness noted in the parascapular region bilaterally right greater than left.  Patient has some very mild scapular dyskinesis noted. Hypermobility noted.  Osteopathic findings T5 extended rotated and side bent right with inhaled rib T7 extended rotated and side bent left with inhaled rib   Impression and Recommendations:    The above documentation has been reviewed and is accurate and complete 12/10/2020, DO

## 2020-10-26 NOTE — Assessment & Plan Note (Signed)
The patient is responding to the conservative therapy we will get an x-ray to make sure there is no other bony abnormality that could be potentially contributing.  Do think is more muscle related.  Does respond well to manipulation.  Discussed icing regimen and home exercises.  Increase activity slowly.  Follow-up with me again in 3 to 4 weeks

## 2020-10-26 NOTE — Patient Instructions (Signed)
Diclofenac 2x a day for 5 days See me as needed Xray on way out

## 2020-10-30 ENCOUNTER — Other Ambulatory Visit: Payer: Self-pay | Admitting: Family Medicine

## 2020-11-04 ENCOUNTER — Ambulatory Visit: Payer: No Typology Code available for payment source | Admitting: Family Medicine

## 2020-11-09 ENCOUNTER — Ambulatory Visit: Payer: No Typology Code available for payment source | Admitting: Family Medicine

## 2020-11-09 ENCOUNTER — Other Ambulatory Visit: Payer: Self-pay

## 2020-11-09 ENCOUNTER — Encounter: Payer: Self-pay | Admitting: Family Medicine

## 2020-11-09 ENCOUNTER — Ambulatory Visit (INDEPENDENT_AMBULATORY_CARE_PROVIDER_SITE_OTHER): Payer: No Typology Code available for payment source

## 2020-11-09 VITALS — BP 116/84 | HR 88 | Ht 68.0 in | Wt 122.0 lb

## 2020-11-09 DIAGNOSIS — M546 Pain in thoracic spine: Secondary | ICD-10-CM

## 2020-11-09 DIAGNOSIS — M9901 Segmental and somatic dysfunction of cervical region: Secondary | ICD-10-CM | POA: Diagnosis not present

## 2020-11-09 DIAGNOSIS — M533 Sacrococcygeal disorders, not elsewhere classified: Secondary | ICD-10-CM

## 2020-11-09 DIAGNOSIS — M9902 Segmental and somatic dysfunction of thoracic region: Secondary | ICD-10-CM

## 2020-11-09 DIAGNOSIS — M9903 Segmental and somatic dysfunction of lumbar region: Secondary | ICD-10-CM

## 2020-11-09 DIAGNOSIS — M9908 Segmental and somatic dysfunction of rib cage: Secondary | ICD-10-CM

## 2020-11-09 DIAGNOSIS — M9904 Segmental and somatic dysfunction of sacral region: Secondary | ICD-10-CM | POA: Diagnosis not present

## 2020-11-09 IMAGING — DX DG THORACIC SPINE 2V
3 series · 3 of 3 positions shown · non-contrast
Comparison: None.

CLINICAL DATA: Mid back pain for 6-8 weeks

EXAM:
THORACIC SPINE 2 VIEWS

[t-spine ap]
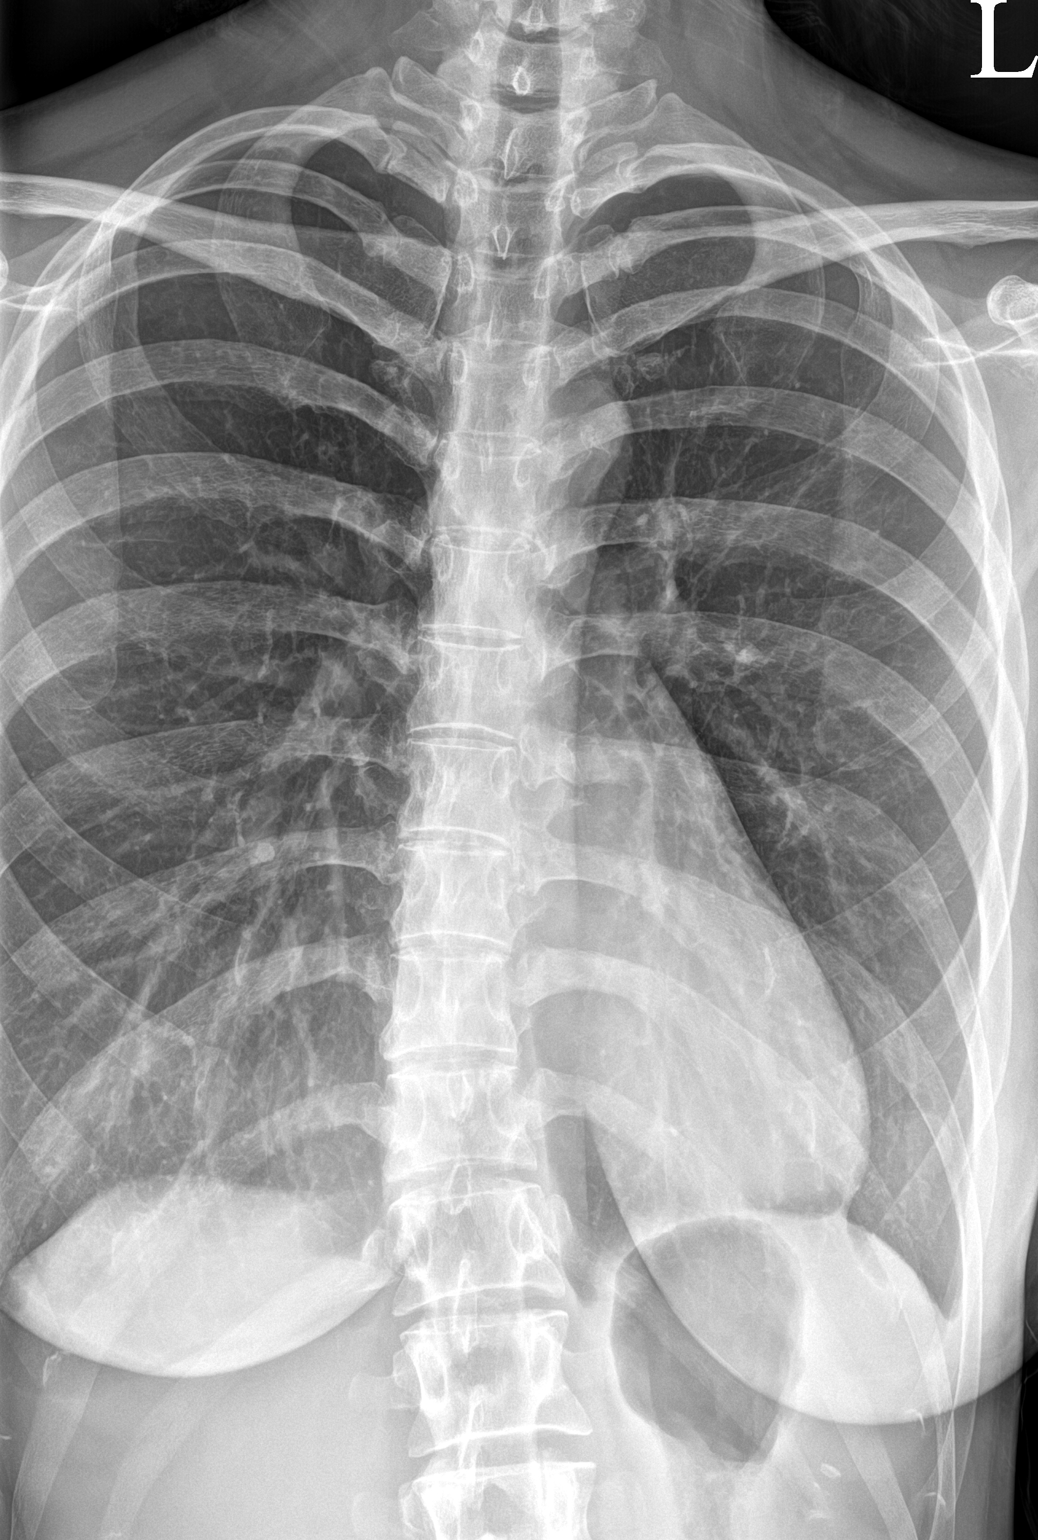

[t-spine lat]
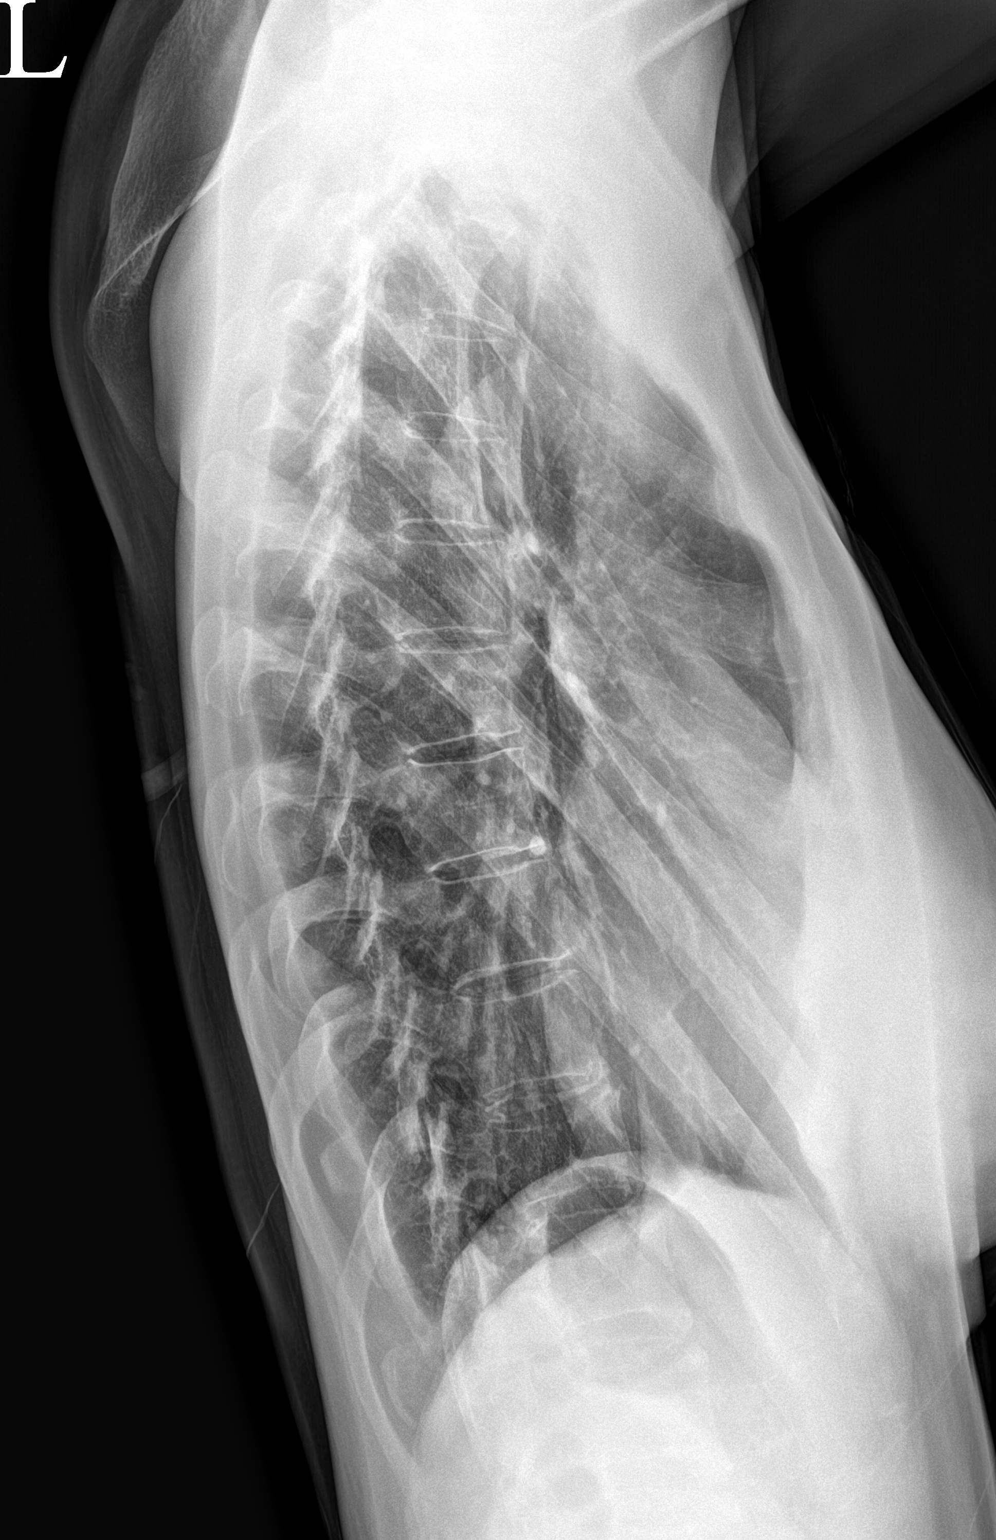

[ct-spine swimmers]
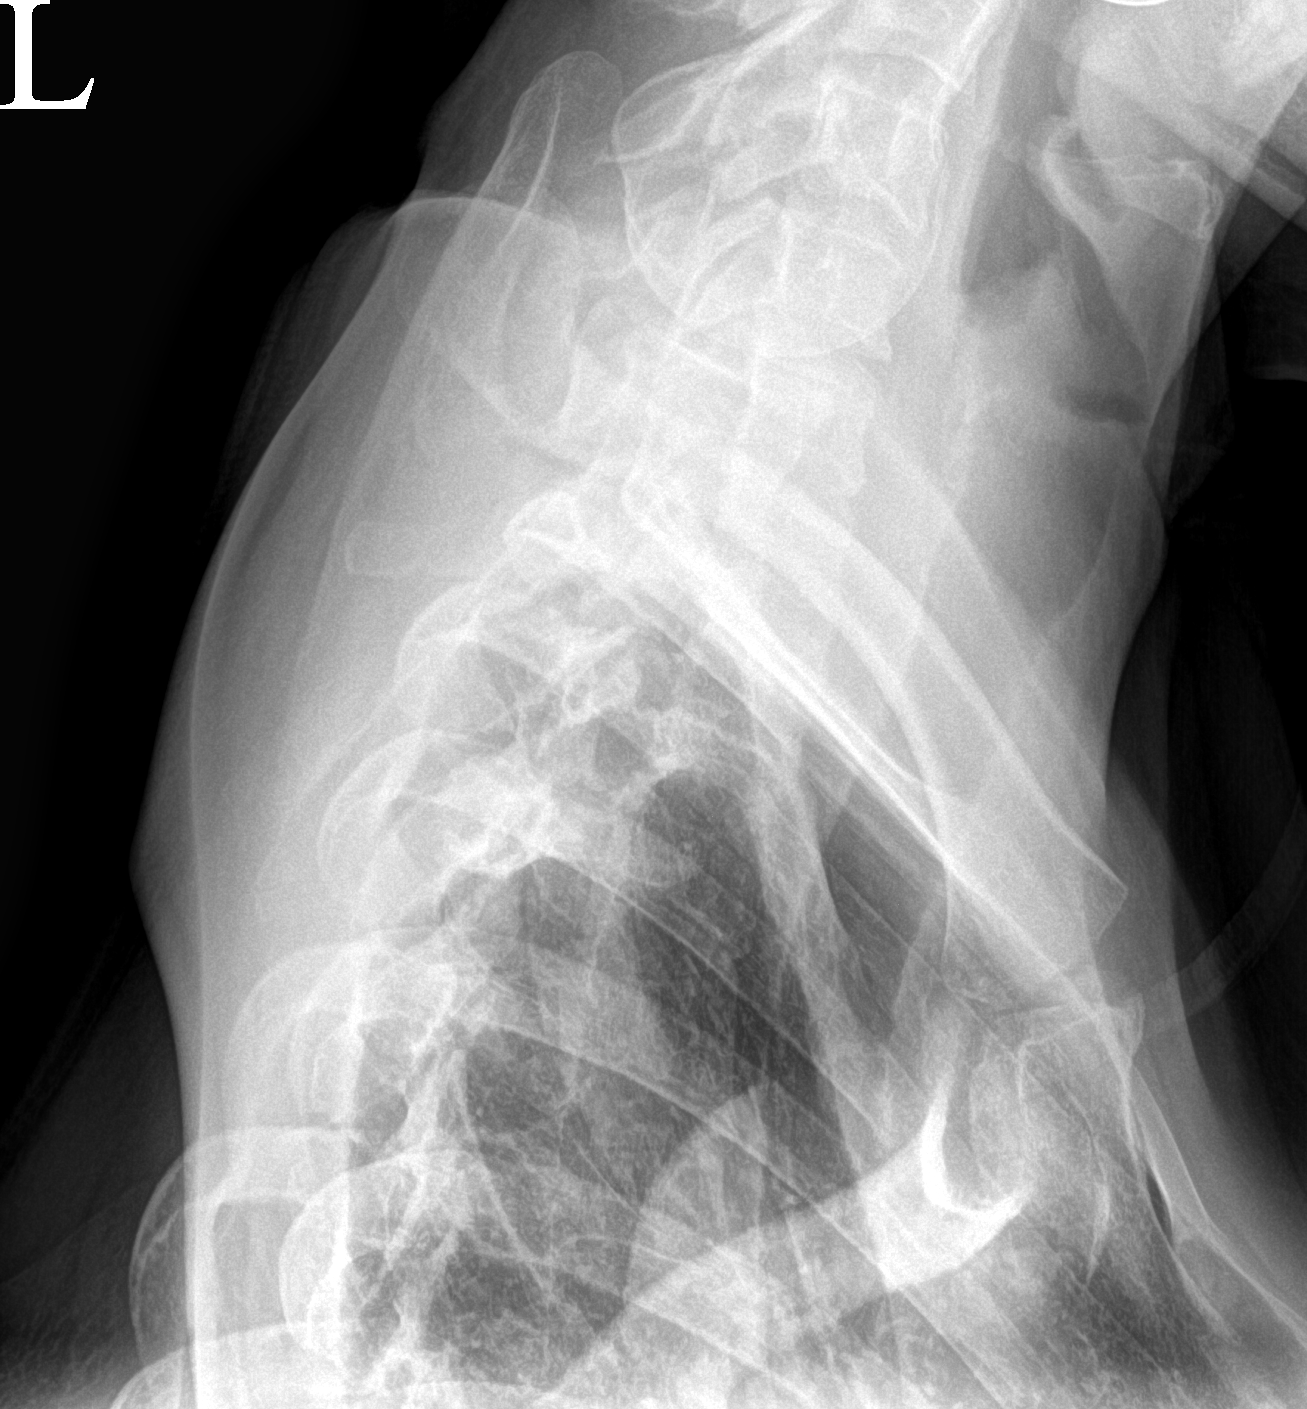

[3 of 3 positions shown; findings below may reference images not displayed]

FINDINGS: Frontal and lateral views of the thoracic spine are obtained. There
is mild right convex scoliosis centered at T11. Otherwise alignment
is anatomic. There are no acute or destructive bony lesions.
Paraspinal soft tissues are unremarkable.
IMPRESSION: 1. Mild right convex lower thoracic scoliosis. Otherwise
unremarkable exam.

## 2020-11-09 NOTE — Assessment & Plan Note (Signed)
Improvement noted recently.  Seems to be stable at the moment.  We will not make any other significant changes.  Follow-up again 6 weeks.

## 2020-11-09 NOTE — Patient Instructions (Addendum)
Good to see you  Xray today MRI [facility] [phone] MM relaxer at night One diclofenac instead of 2 See me as scheduled

## 2020-11-09 NOTE — Progress Notes (Signed)
[name] [address] [place] [address] Phone: [phone] Subjective:   [name], am serving as a scribe for Dr. Antoine Primas.  This visit occurred during the SARS-CoV-2 public health emergency.  Safety protocols were in place, including screening questions prior to the visit, additional usage of staff PPE, and extensive cleaning of exam room while observing appropriate contact time as indicated for disinfecting solutions.   I'm seeing this patient by the request  of:  Sandford Craze, NP  CC: Back pain follow-up  [id number]  Debbie [name] is a 35 y.o. female coming in with complaint of back and neck pain. OMT 10/26/2020. Patient states that she continues to have pain since last visit. Has been swimming 2x since last visit. Continued pain in thoracic spine.   Medications patient has been prescribed: Effexor, Zanaflex  Taking:          Past Medical History:  Diagnosis Date   Abnormal heart rhythm    History of chicken pox    POTS (postural orthostatic tachycardia syndrome)     Allergies  Allergen Reactions   Gluten Meal      Review of Systems:  No headache, visual changes, nausea, vomiting, diarrhea, constipation, dizziness, abdominal pain, skin rash, fevers, chills, night sweats, weight loss, swollen lymph nodes, body aches, joint swelling, chest pain, shortness of breath, mood changes. POSITIVE muscle aches  Objective  Blood pressure 116/84, pulse 88, height 5\' 8"  (1.727 m), weight 122 lb (55.3 kg), last menstrual period 11/06/2020.   General: No apparent distress alert and oriented x3 mood and affect normal, dressed appropriately.  HEENT: Pupils equal, extraocular movements intact  Respiratory: Patient's speak in full sentences and does not appear short of breath  Cardiovascular: No lower extremity edema, non tender, no erythema  Continues to have pain out of proportion in the thoracic spine.  Seems to  be though just to the right and left of midline around the T8-T9 area.  Patient has some mild increased discomfort over the rib on the right side on T7.  Osteopathic findings  C6 flexed rotated and side bent left T7 extended rotated and side bent right inhaled rib T9 extended rotated and side bent left L2 flexed rotated and side bent right Sacrum right on right       Assessment and Plan:  Acute thoracic back pain Patient continues to have pain in the mid back.  Seems to be out of proportion to the amount of palpation.  No sign of any type of bony abnormality noted on x-ray but unfortunately patient is unable to do any activities other than her daily activities.  Waking her up at night.  Do feel advanced imaging is warranted at this time we will get an MRI to further evaluate.  Coccydynia Improvement noted recently.  Seems to be stable at the moment.  We will not make any other significant changes.  Follow-up again 6 weeks.   Nonallopathic problems  Decision today to treat with OMT was based on Physical Exam  After verbal consent patient was treated with  ME, FPR techniques in cervical, rib, thoracic, lumbar, and sacral  areas avoided HVLA especially in the thoracic area today secondary to the discomfort and pain  Patient tolerated the procedure well with improvement in symptoms  Patient given exercises, stretches and lifestyle modifications  See medications in patient instructions if given  Patient will follow up in 4-8 weeks      The above documentation has  been reviewed and is accurate and complete [name], DO       Note: This dictation was prepared with Dragon dictation along with smaller phrase technology. Any transcriptional errors that result from this process are unintentional.

## 2020-11-09 NOTE — Assessment & Plan Note (Signed)
Patient continues to have pain in the mid back.  Seems to be out of proportion to the amount of palpation.  No sign of any type of bony abnormality noted on x-ray but unfortunately patient is unable to do any activities other than her daily activities.  Waking her up at night.  Do feel advanced imaging is warranted at this time we will get an MRI to further evaluate.

## 2020-11-14 ENCOUNTER — Ambulatory Visit
Admission: RE | Admit: 2020-11-14 | Discharge: 2020-11-14 | Disposition: A | Discharge status: Home / Self Care | Payer: No Typology Code available for payment source | Source: Ambulatory Visit | Attending: Family Medicine | Admitting: Family Medicine

## 2020-11-14 ENCOUNTER — Other Ambulatory Visit: Payer: Self-pay

## 2020-11-14 DIAGNOSIS — M546 Pain in thoracic spine: Secondary | ICD-10-CM

## 2020-11-14 IMAGING — MR MR THORACIC SPINE W/O CM
4 of 5 series · 18 of 48 positions shown · non-contrast
Comparison: Thoracic spine radiographs [DATE].

CLINICAL DATA: Midthoracic back pain. Bilateral scapular pain for 1
month.

EXAM:
MRI THORACIC SPINE WITHOUT CONTRAST
TECHNIQUE: Multiplanar, multisequence MR imaging of the thoracic spine was
performed. No intravenous contrast was administered.

[Series 4: STIR · sagittal · 3.0mm · 1.09mm/px · 3 of 17 slices shown]
[im 4/17]
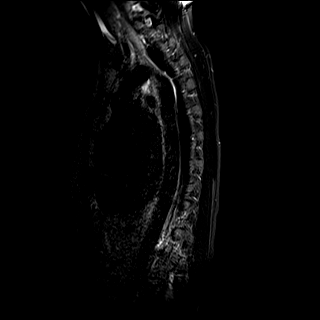
[im 10/17]
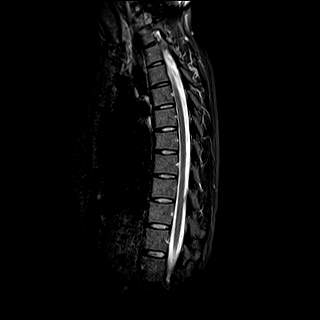
[im 17/17]
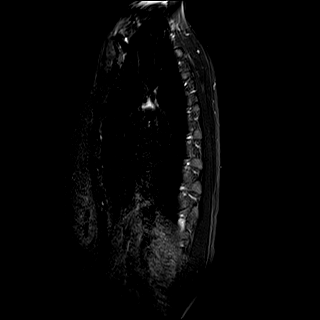

[Series 5: T2 · sagittal · 3.0mm · 0.55mm/px · 6 of 17 slices shown (1 of 2)]
[im 1/17]
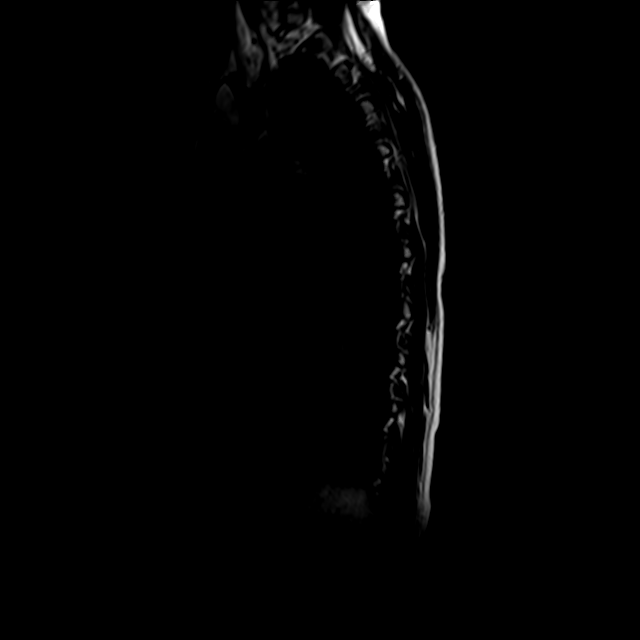
[im 4/17]
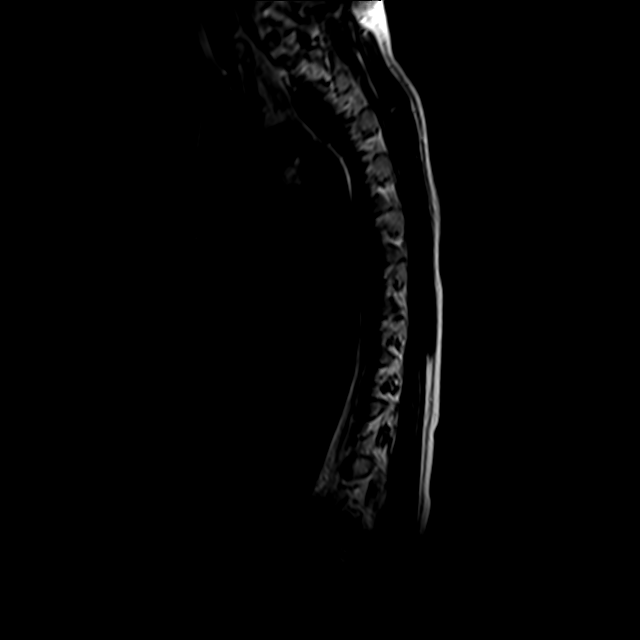
[im 7/17]
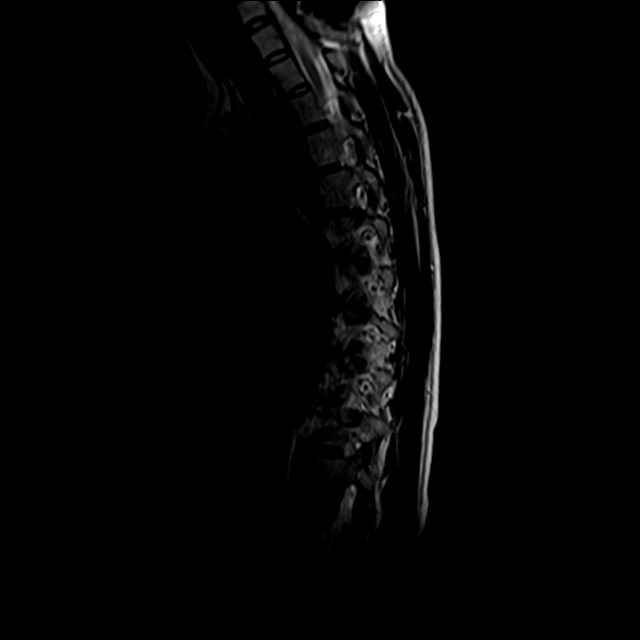
[im 10/17]
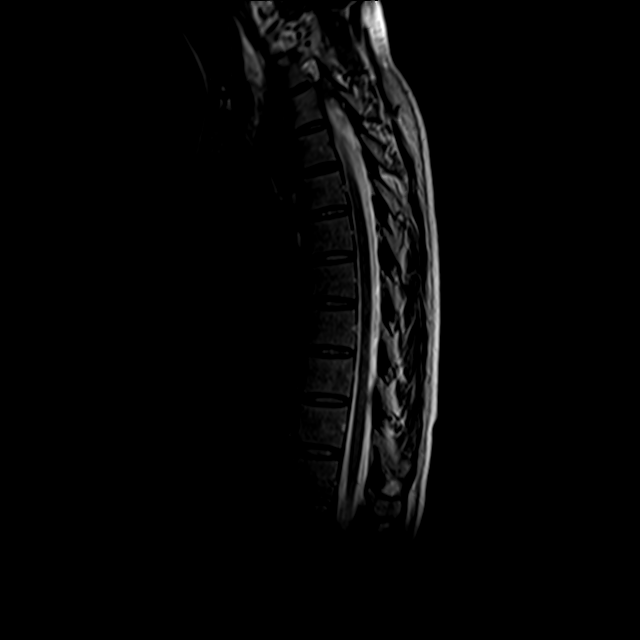
[im 13/17]
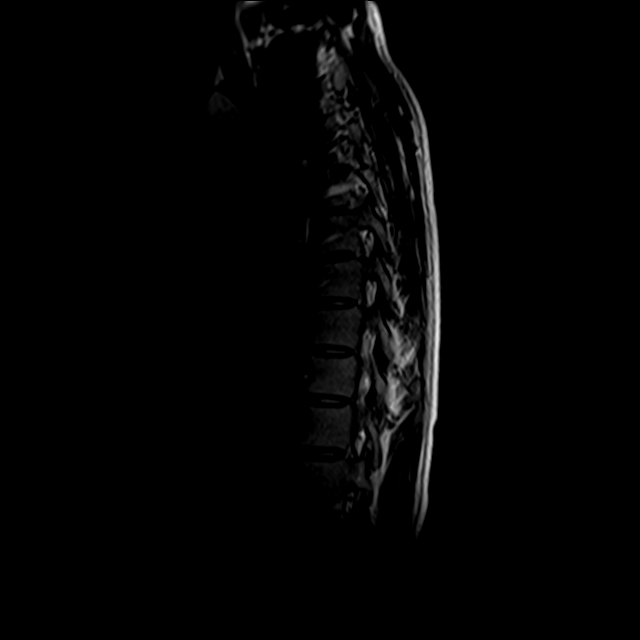
[im 17/17]
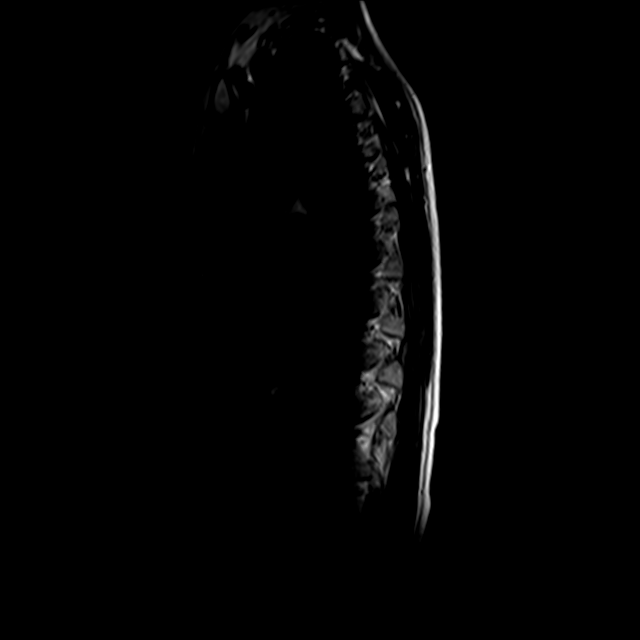

[Series 6: T1 · sagittal · 3.0mm · 0.55mm/px · 3 of 17 slices shown]
[im 4/17]
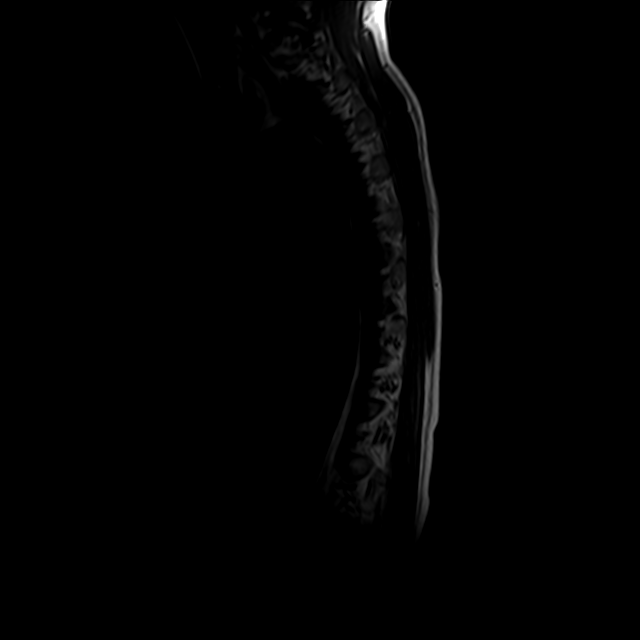
[im 10/17]
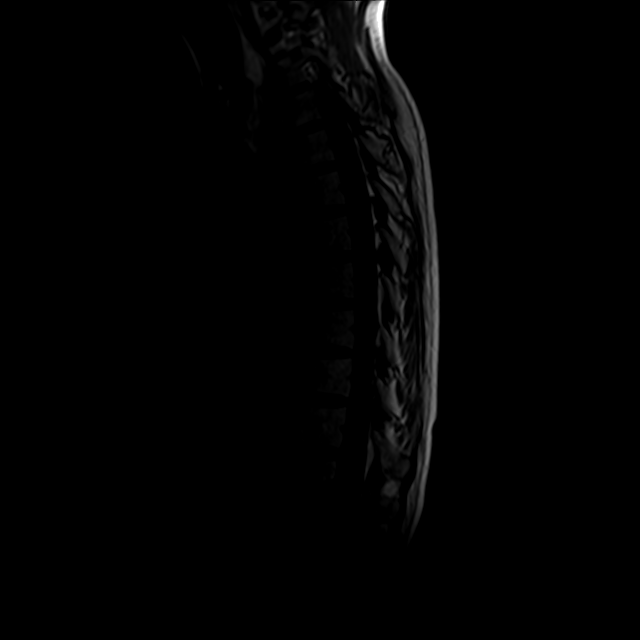
[im 17/17]
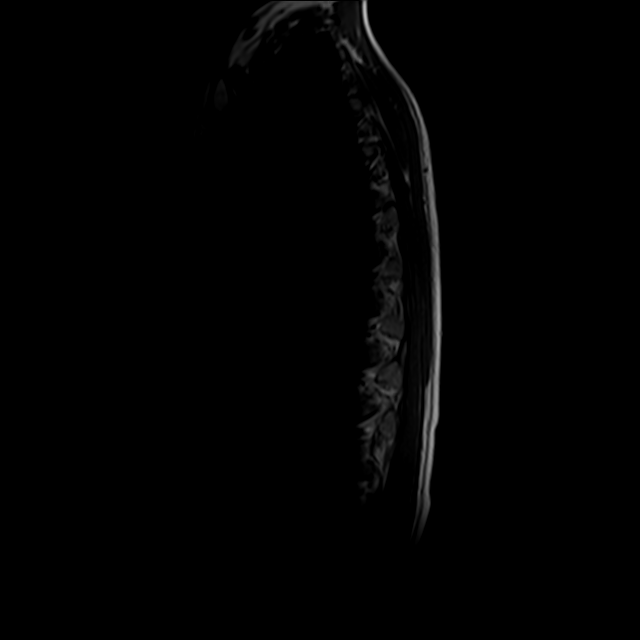

[Series 11: T2 · axial · 4.0mm · 0.39mm/px · z∈[-288,-69]mm · 6 of 39 slices shown (2 of 2)]
[im 1/39]
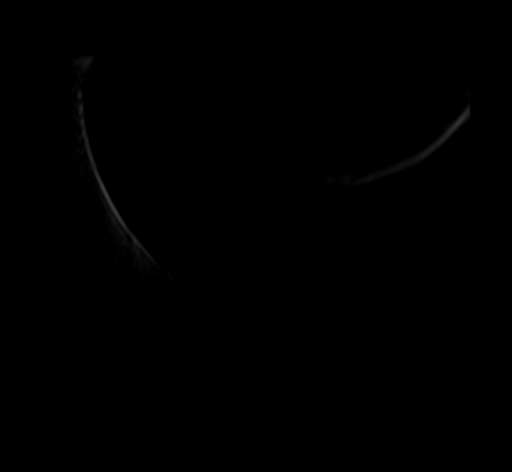
[im 6/39]
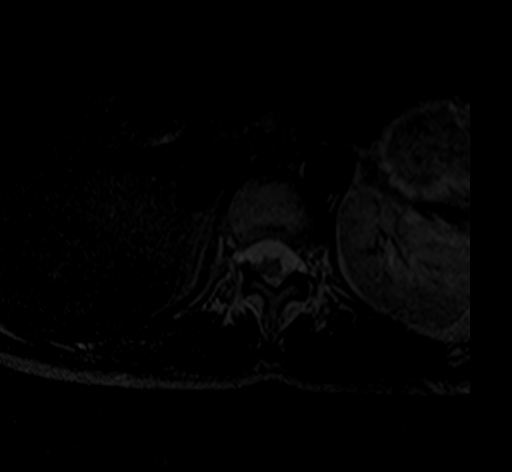
[im 11/39]
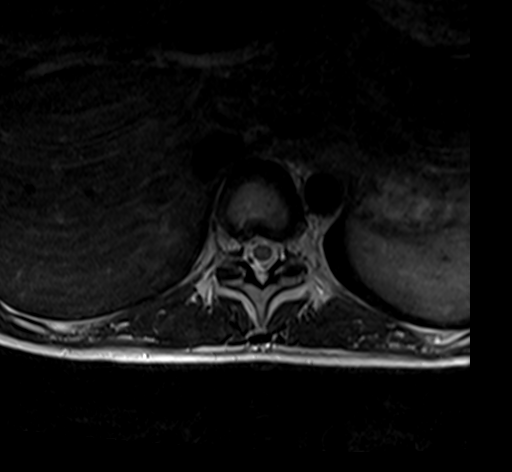
[im 17/39]
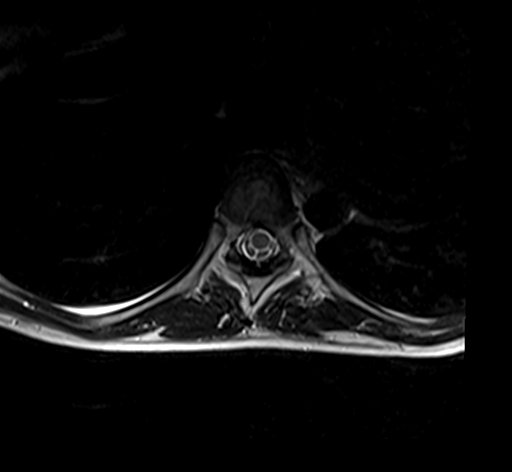
[im 20/39]
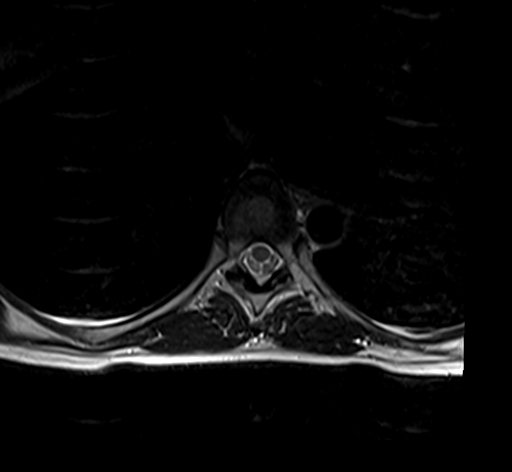
[im 33/39]
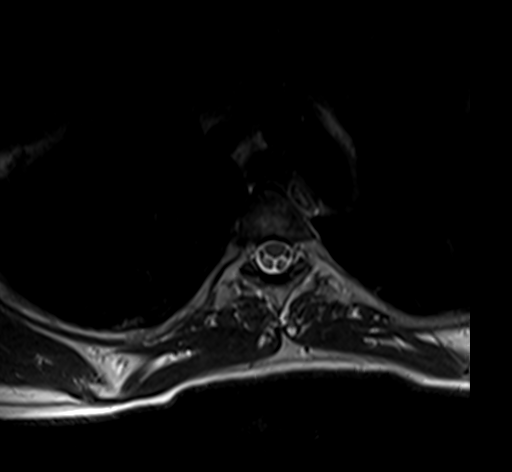

[18 of 48 positions shown; findings below may reference images not displayed]

FINDINGS: Alignment: Rightward curvature of the thoracic spine is again noted.
Curvature is centered at T9-10.

Vertebrae: Marrow signal and vertebral body heights are normal.

Cord:  Normal signal and morphology.

Paraspinal and other soft tissues: Paraspinous soft tissues are
within normal limits. Visualized lung fields are clear. Visualized
upper abdomen is unremarkable.

Disc levels:

No significant focal disc protrusion or stenosis is present.
Foramina are patent bilaterally.
IMPRESSION: 1. Stable rightward curvature of the thoracic spine.
2. No acute or focal abnormality to explain the patient's symptoms.

## 2020-11-20 ENCOUNTER — Other Ambulatory Visit: Payer: Self-pay

## 2020-11-20 ENCOUNTER — Ambulatory Visit: Payer: No Typology Code available for payment source | Admitting: Family Medicine

## 2020-11-20 ENCOUNTER — Encounter: Payer: Self-pay | Admitting: Family Medicine

## 2020-11-20 VITALS — BP 110/80 | HR 83 | Ht 66.0 in | Wt 123.0 lb

## 2020-11-20 DIAGNOSIS — M546 Pain in thoracic spine: Secondary | ICD-10-CM | POA: Diagnosis not present

## 2020-11-20 DIAGNOSIS — M9904 Segmental and somatic dysfunction of sacral region: Secondary | ICD-10-CM | POA: Diagnosis not present

## 2020-11-20 DIAGNOSIS — M533 Sacrococcygeal disorders, not elsewhere classified: Secondary | ICD-10-CM | POA: Diagnosis not present

## 2020-11-20 DIAGNOSIS — M9903 Segmental and somatic dysfunction of lumbar region: Secondary | ICD-10-CM | POA: Diagnosis not present

## 2020-11-20 DIAGNOSIS — M9902 Segmental and somatic dysfunction of thoracic region: Secondary | ICD-10-CM

## 2020-11-20 DIAGNOSIS — M9908 Segmental and somatic dysfunction of rib cage: Secondary | ICD-10-CM

## 2020-11-20 DIAGNOSIS — M9901 Segmental and somatic dysfunction of cervical region: Secondary | ICD-10-CM | POA: Diagnosis not present

## 2020-11-20 MED ORDER — DICLOFENAC SODIUM 75 MG PO TBEC: 75.0000 mg | DELAYED_RELEASE_TABLET | Freq: Two times a day (BID) | ORAL | 1 refills | Status: DC | PRN

## 2020-11-20 NOTE — Progress Notes (Signed)
[name] [address] [place] [address] Phone: [phone] Subjective:   Debbie [name], am serving as a scribe for Dr. Antoine Primas.  I'm seeing this patient by the request  of:  Sandford Craze, NP  CC: Upper back and lower back follow-up  [id number]  Debbie [name] is a 35 y.o. female coming in with complaint of back and neck pain.  Patient continued to have pain and was sent for an MRI of the thoracic spine.  MRI was independently visualized by me showing very mild curvature of the thoracic spine but no significant bony abnormality or cyst formation noted or any type of occult fracture.  Patient states that she has been feeling about the same, went swimming and realized she aggravated the Right upper back/shoulder/neck area.  Medications patient has been prescribed: Effexor, Zanaflex, gabapentin  Taking: Effexor Zanaflex        Reviewed prior external information including notes and imaging from previsou exam, outside providers and external EMR if available.   As well as notes that were available from care everywhere and other healthcare systems.  Past medical history, social, surgical and family history all reviewed in electronic medical record.  No pertanent information unless stated regarding to the chief complaint.   Past Medical History:  Diagnosis Date   Abnormal heart rhythm    History of chicken pox    POTS (postural orthostatic tachycardia syndrome)     Allergies  Allergen Reactions   Gluten Meal      Review of Systems:  No headache, visual changes, nausea, vomiting, diarrhea, constipation, dizziness, abdominal pain, skin rash, fevers, chills, night sweats, weight loss, swollen lymph nodes, body aches, joint swelling, chest pain, shortness of breath, mood changes. POSITIVE muscle aches  Objective  Blood pressure 110/80, pulse 83, height 5\' 6"  (1.676 m), weight 123 lb (55.8 kg), last menstrual period  11/06/2020, SpO2 99 %.   General: No apparent distress alert and oriented x3 mood and affect normal, dressed appropriately.  HEENT: Pupils equal, extraocular movements intact  Respiratory: Patient's speak in full sentences and does not appear short of breath  Cardiovascular: No lower extremity edema, non tender, no erythema  Scapular mobilization techniques done today. Tightness noted today   Osteopathic findings  C2 flexed rotated and side bent right C7 flexed rotated and side bent left T3 extended rotated and side bent right inhaled rib T8 extended rotated and side bent left L2 flexed rotated and side bent right Sacrum right on right       Assessment and Plan:  Acute thoracic back pain MRI noted no findings.  Discussed HEP  We did do mobilization of the scapula today with some mild improvement but this will be better.  Given different exercises hoping that is more secondary to this as well.  Follow-up again in 4 weeks   Nonallopathic problems  Decision today to treat with OMT was based on Physical Exam  After verbal consent patient was treated with HVLA, ME, FPR techniques in cervical, rib, thoracic, lumbar, and sacral  areas  Patient tolerated the procedure well with improvement in symptoms  Patient given exercises, stretches and lifestyle modifications  See medications in patient instructions if given  Patient will follow up in 4-8 weeks      The above documentation has been reviewed and is accurate and complete 01/06/2021, DO       Note: This dictation was prepared with Dragon dictation along with smaller phrase technology.  Any transcriptional errors that result from this process are unintentional.

## 2020-11-20 NOTE — Assessment & Plan Note (Signed)
MRI noted no findings.  Discussed HEP  We did do mobilization of the scapula today with some mild improvement but this will be better.  Given different exercises hoping that is more secondary to this as well.  Follow-up again in 4 weeks

## 2020-11-20 NOTE — Patient Instructions (Addendum)
Good to see you  Refill of Voltaren sent to pharmacy  See me again in 4 weeks

## 2020-11-20 NOTE — Assessment & Plan Note (Signed)
Continues to have intermittent pain.  Continues to do.  Review home exercises.  We will hold on any injection responded well to manipulation today.  Follow-up again in 4 weeks.

## 2020-12-06 ENCOUNTER — Other Ambulatory Visit: Payer: Self-pay | Admitting: Family Medicine

## 2020-12-21 ENCOUNTER — Other Ambulatory Visit: Payer: Self-pay | Admitting: Family Medicine

## 2020-12-23 ENCOUNTER — Other Ambulatory Visit: Payer: Self-pay

## 2020-12-23 ENCOUNTER — Ambulatory Visit: Payer: No Typology Code available for payment source | Admitting: Family Medicine

## 2020-12-23 ENCOUNTER — Encounter: Payer: Self-pay | Admitting: Family Medicine

## 2020-12-23 VITALS — BP 110/80 | HR 76 | Ht 66.0 in | Wt 123.0 lb

## 2020-12-23 DIAGNOSIS — M9901 Segmental and somatic dysfunction of cervical region: Secondary | ICD-10-CM | POA: Diagnosis not present

## 2020-12-23 DIAGNOSIS — M9902 Segmental and somatic dysfunction of thoracic region: Secondary | ICD-10-CM | POA: Diagnosis not present

## 2020-12-23 DIAGNOSIS — M9903 Segmental and somatic dysfunction of lumbar region: Secondary | ICD-10-CM | POA: Diagnosis not present

## 2020-12-23 DIAGNOSIS — M546 Pain in thoracic spine: Secondary | ICD-10-CM | POA: Diagnosis not present

## 2020-12-23 DIAGNOSIS — M9904 Segmental and somatic dysfunction of sacral region: Secondary | ICD-10-CM | POA: Diagnosis not present

## 2020-12-23 NOTE — Assessment & Plan Note (Signed)
Patient given injections and tolerated the procedure well.  Discussed icing regimen and home exercises.  Discussed avoiding certain activities.  Patient has had work-up previously for this problem and MRI was unremarkable.  Continues to have pain and seems to be out of proportion.  Patient will has been able to likely start doing some running which is making it helpful.  Discussed increasing activity slowly, icing regimen.  Follow-up again in 6 weeks

## 2020-12-23 NOTE — Progress Notes (Signed)
[name] [address] [place] [address] Phone: [phone] Subjective:   Debbie [name], am serving as a scribe for Dr. Antoine Primas. This visit occurred during the SARS-CoV-2 public health emergency.  Safety protocols were in place, including screening questions prior to the visit, additional usage of staff PPE, and extensive cleaning of exam room while observing appropriate contact time as indicated for disinfecting solutions.   I'm seeing this patient by the request  of:  Sandford Craze, NP  CC: Mid back and low back follow-up  [id number]  Ilia Chiem is a 35 y.o. female coming in with complaint of back pain. OMT on 11/20/2020. Patient states that she is able to run 10 minutes without pain but has not been swimming. Patient has not had to use Zanaflex.  No new symptoms.  Still seems to be when she does try to swim have more discomfort in the thoracic spine.  Medications patient has been prescribed: Zanaflex, Voltaren          Reviewed prior external information including notes and imaging from previsou exam, outside providers and external EMR if available.   As well as notes that were available from care everywhere and other healthcare systems.  Past medical history, social, surgical and family history all reviewed in electronic medical record.  No pertanent information unless stated regarding to the chief complaint.   Past Medical History:  Diagnosis Date   Abnormal heart rhythm    History of chicken pox    POTS (postural orthostatic tachycardia syndrome)     Allergies  Allergen Reactions   Gluten Meal      Review of Systems:  No headache, visual changes, nausea, vomiting, diarrhea, constipation, dizziness, abdominal pain, skin rash, fevers, chills, night sweats, weight loss, swollen lymph nodes, body aches, joint swelling, chest pain, shortness of breath, mood changes. POSITIVE muscle aches  Objective   Blood pressure 110/80, pulse 76, height 5\' 6"  (1.676 m), weight 123 lb (55.8 kg), SpO2 98 %.   General: No apparent distress alert and oriented x3 mood and affect normal, dressed appropriately.  HEENT: Pupils equal, extraocular movements intact  Respiratory: Patient's speak in full sentences and does not appear short of breath  Cardiovascular: No lower extremity edema, non tender, no erythema   Back exam shows the patient still has tightness noted more in the mid thoracic area in the parascapular regions .  Noticed to have some mild pain over the sacroiliac joint bilaterally. Osteopathic findings  C2 flexed rotated and side bent right C6 flexed rotated and side bent left T3 extended rotated and side bent right inhaled rib T8 extended rotated and side bent left L2 flexed rotated and side bent right Sacrum right on right   After verbal consent patient was prepped with alcohol swab and with a 25-gauge half inch needle injected into 4 distinct trigger points in the thoracic area.  A total of 3 cc of 0.5% Marcaine and 1 cc of Kenalog 40 mg/mL used.  No blood loss.  Postinjection instructions given.    Assessment and Plan:  Trigger point of thoracic region Patient given injections and tolerated the procedure well.  Discussed icing regimen and home exercises.  Discussed avoiding certain activities.  Patient has had work-up previously for this problem and MRI was unremarkable.  Continues to have pain and seems to be out of proportion.  Patient will has been able to likely start doing some running which is making it helpful.  Discussed  increasing activity slowly, icing regimen.  Follow-up again in 6 weeks   Nonallopathic problems  Decision today to treat with OMT was based on Physical Exam  After verbal consent patient was treated with HVLA, ME, FPR techniques in cervical, rib, thoracic, lumbar, and sacral  areas  Patient tolerated the procedure well with improvement in symptoms  Patient  given exercises, stretches and lifestyle modifications  See medications in patient instructions if given  Patient will follow up in 4-8 weeks      The above documentation has been reviewed and is accurate and complete Judi Saa, DO       Note: This dictation was prepared with Dragon dictation along with smaller phrase technology. Any transcriptional errors that result from this process are unintentional.

## 2020-12-23 NOTE — Patient Instructions (Signed)
Good to see you. Tried some trigger point injections today that hopefully will help. Increase activity as tolerated.   Follow-up with me again in 6 weeks.

## 2021-02-03 ENCOUNTER — Ambulatory Visit: Payer: No Typology Code available for payment source | Admitting: Family Medicine

## 2021-02-07 ENCOUNTER — Other Ambulatory Visit: Payer: Self-pay | Admitting: Family Medicine

## 2021-02-08 ENCOUNTER — Encounter: Payer: Self-pay | Admitting: Sports Medicine

## 2021-02-08 ENCOUNTER — Ambulatory Visit: Payer: No Typology Code available for payment source | Admitting: Sports Medicine

## 2021-02-08 ENCOUNTER — Other Ambulatory Visit: Payer: Self-pay

## 2021-02-08 ENCOUNTER — Ambulatory Visit: Payer: No Typology Code available for payment source | Admitting: Family Medicine

## 2021-02-08 VITALS — BP 108/70 | HR 78 | Ht 66.0 in | Wt 123.0 lb

## 2021-02-08 DIAGNOSIS — M9902 Segmental and somatic dysfunction of thoracic region: Secondary | ICD-10-CM | POA: Diagnosis not present

## 2021-02-08 DIAGNOSIS — M7918 Myalgia, other site: Secondary | ICD-10-CM

## 2021-02-08 DIAGNOSIS — M9903 Segmental and somatic dysfunction of lumbar region: Secondary | ICD-10-CM | POA: Diagnosis not present

## 2021-02-08 DIAGNOSIS — M9905 Segmental and somatic dysfunction of pelvic region: Secondary | ICD-10-CM | POA: Diagnosis not present

## 2021-02-08 NOTE — Patient Instructions (Addendum)
Good to see you  Exercises given  Follow up in 4-6 weeks for repeat OMT

## 2021-02-08 NOTE — Progress Notes (Signed)
   Debbie [name] D.[name] [address] [place] [address] Phone: [phone]   Assessment and Plan:     1. Rhomboid muscle pain 2. Somatic dysfunction of thoracic region 3. Somatic dysfunction of lumbar region 4. Somatic dysfunction of pelvic region -Chronic with exacerbation, subsequent visit - Recurrence of typical musculoskeletal complaints with most prominent being in thoracic spine today - Patient is received moderate benefit with OMT in the past, so elects for repeat OMT today.  Tolerated well per note below -Start HEP for scapula as patient's primary pain seems to be located in rhomboids bilaterally - Advance running activity as tolerated.  Continue with swimming activity.  Decision today to treat with OMT was based on Physical Exam   After verbal consent patient was treated with HVLA (high velocity low amplitude), ME (muscle energy), FPR (flex positional release), ST (soft tissue), PC/PD (Pelvic Compression/ Pelvic Decompression) techniques in thoracic, lumbar, and pelvic areas. Patient tolerated the procedure well with improvement in symptoms.  Patient educated on potential side effects of soreness and recommended to rest, hydrate, and use Tylenol as needed for pain control.   Pertinent previous records reviewed include none   Follow Up: 4 weeks for repeat OMT   Subjective:   I, Debbie [name], am serving as a scribe for Dr. [name]  Chief Complaint: neck and back pain   HPI:   02/08/21 Patient is a 35 year old female presenting with neck and back pain. Patient last saw Dr. [name] on 12/23/20 for the same reason and had OMT and injections. Today patient states that she is not doing much better her mid back is tight and pulling.   Relevant Historical Information: POTS  Additional pertinent review of systems negative.  Current Outpatient Medications  Medication Sig Dispense Refill   diclofenac (VOLTAREN) 75 MG EC tablet Take 1  tablet (75 mg total) by mouth 2 (two) times daily as needed. 180 tablet 1   gabapentin (NEURONTIN) 100 MG capsule Take 2 capsules (200 mg total) by mouth at bedtime. 180 capsule 0   tiZANidine (ZANAFLEX) 2 MG tablet TAKE 1 TABLET BY MOUTH EVERYDAY AT BEDTIME 90 tablet 1   venlafaxine XR (EFFEXOR-XR) 150 MG 24 hr capsule TAKE 1 CAPSULE BY MOUTH DAILY WITH BREAKFAST. 90 capsule 2   No current facility-administered medications for this visit.      Objective:     Vitals:   02/08/21 1358  BP: 108/70  Pulse: 78  SpO2: 98%  Weight: 123 lb (55.8 kg)  Height: 5\' 6"  (1.676 m)      Body mass index is 19.85 kg/m.    Physical Exam:     General: Well-appearing, cooperative, sitting comfortably in no acute distress.   OMT Physical Exam:  ASIS Compression Test: Positive Right Thoracic: TTP paraspinal and rhomboids bilaterally, T4-8 RRSL Lumbar: TTP paraspinal, L2 RLSL Pelvis: Right anterior innominate without flare  Electronically signed by:  [name] 3:27 PM 02/08/21

## 2021-02-17 ENCOUNTER — Ambulatory Visit: Payer: No Typology Code available for payment source | Admitting: Family Medicine

## 2021-03-22 NOTE — Progress Notes (Signed)
[name] [address] [place] [address] Phone: [phone] Subjective:   [name], am serving as a scribe for Dr. Antoine Primas. This visit occurred during the SARS-CoV-2 public health emergency.  Safety protocols were in place, including screening questions prior to the visit, additional usage of staff PPE, and extensive cleaning of exam room while observing appropriate contact time as indicated for disinfecting solutions.  I'm seeing this patient by the request  of:  Sandford Craze, NP  CC: neck and back pain   [id number]  Debbie [name] is a 35 y.o. female coming in with complaint of back and neck pain. OMT with Dr. Jean Rosenthal on 02/08/2021. Patient states that she is about the same as last visit. Has tried exercises which did not aggravate scapula but did not help her pain as well. Continues to have pain between scapula and R sided lumbar spine pain. Unable to wear hard fabrics with seams due to coccyx pain.   Medications patient has been prescribed: Zanaflex and Effexor  Taking:         Reviewed prior external information including notes and imaging from previsou exam, outside providers and external EMR if available.   As well as notes that were available from care everywhere and other healthcare systems.  Past medical history, social, surgical and family history all reviewed in electronic medical record.  No pertanent information unless stated regarding to the chief complaint.   Past Medical History:  Diagnosis Date   Abnormal heart rhythm    History of chicken pox    POTS (postural orthostatic tachycardia syndrome)     Allergies  Allergen Reactions   Gluten Meal      Review of Systems:  No headache, visual changes, nausea, vomiting, diarrhea, constipation, dizziness, abdominal pain, skin rash, fevers, chills, night sweats, weight loss, swollen lymph nodes, body aches, joint swelling, chest pain, shortness of  breath, mood changes. POSITIVE muscle aches  Objective  Blood pressure 110/72, pulse 98, height 5\' 6"  (1.676 m), weight 123 lb (55.8 kg), SpO2 99 %.   General: No apparent distress alert and oriented x3 mood and affect normal, dressed appropriately.  HEENT: Pupils equal, extraocular movements intact  Respiratory: Patient's speak in full sentences and does not appear short of breath  Cardiovascular: No lower extremity edema, non tender, no erythema  Loss of lordosis positive FABER  Pain in the right shoulder blade no midline tenderness.    Osteopathic findings  C2 flexed rotated and side bent right C7 flexed rotated and side bent left T3 extended rotated and side bent right inhaled rib T6 extended rotated and side bent left L2 flexed rotated and side bent right Sacrum right on right       Assessment and Plan:  Acute thoracic back pain Patient continues to have significant pain but good Extremely well though hopefully to osteopathic manipulation today.  Patient is to start increasing activity if possible.  Discussed which activities to do which wants to avoid.  Increase activity slowly.  Follow-up again in 6 to 8 weeks.   Nonallopathic problems  Decision today to treat with OMT was based on Physical Exam  After verbal consent patient was treated with HVLA, ME, FPR techniques in cervical, rib, thoracic, lumbar, and sacral  areas  Patient tolerated the procedure well with improvement in symptoms  Patient given exercises, stretches and lifestyle modifications  See medications in patient instructions if given  Patient will follow up in 4-8 weeks  The above documentation has been reviewed and is accurate and complete Judi Saa, DO        Note: This dictation was prepared with Dragon dictation along with smaller phrase technology. Any transcriptional errors that result from this process are unintentional.

## 2021-03-23 ENCOUNTER — Ambulatory Visit: Payer: No Typology Code available for payment source | Admitting: Family Medicine

## 2021-03-23 ENCOUNTER — Other Ambulatory Visit: Payer: Self-pay

## 2021-03-23 VITALS — BP 110/72 | HR 98 | Ht 66.0 in | Wt 123.0 lb

## 2021-03-23 DIAGNOSIS — M9901 Segmental and somatic dysfunction of cervical region: Secondary | ICD-10-CM

## 2021-03-23 DIAGNOSIS — M9908 Segmental and somatic dysfunction of rib cage: Secondary | ICD-10-CM

## 2021-03-23 DIAGNOSIS — M546 Pain in thoracic spine: Secondary | ICD-10-CM

## 2021-03-23 DIAGNOSIS — M9903 Segmental and somatic dysfunction of lumbar region: Secondary | ICD-10-CM

## 2021-03-23 DIAGNOSIS — M9904 Segmental and somatic dysfunction of sacral region: Secondary | ICD-10-CM

## 2021-03-23 DIAGNOSIS — M9902 Segmental and somatic dysfunction of thoracic region: Secondary | ICD-10-CM

## 2021-03-23 NOTE — Assessment & Plan Note (Signed)
Patient continues to have significant pain but good Extremely well though hopefully to osteopathic manipulation today.  Patient is to start increasing activity if possible.  Discussed which activities to do which wants to avoid.  Increase activity slowly.  Follow-up again in 6 to 8 weeks.

## 2021-03-23 NOTE — Patient Instructions (Signed)
Good to see you Introduce swimming and running 2 days a week Ok to do up to 10 minutes of lifting with machines See me again in 5-6 weeks

## 2021-04-20 NOTE — Progress Notes (Signed)
[name] [address] [place] [address] Phone: [phone] Subjective:   Debbie [name], am serving as a scribe for Dr. Antoine Primas.This visit occurred during the SARS-CoV-2 public health emergency.  Safety protocols were in place, including screening questions prior to the visit, additional usage of staff PPE, and extensive cleaning of exam room while observing appropriate contact time as indicated for disinfecting solutions.  I'm seeing this patient by the request  of:  Sandford Craze, NP  CC: Back and neck pain  [id number]  Letonya Lamaster is a 36 y.o. female coming in with complaint of back and neck pain. OMT 03/05/2021. Patient states that she is doing somewhat better. Has tried swimming and this has felt good. Just started working with trainer.  Patient feels like it has been a little beneficial.  Patient is cautiously optimistic  Medications patient has been prescribed: Effexor  Taking:         Reviewed prior external information including notes and imaging from previsou exam, outside providers and external EMR if available.   As well as notes that were available from care everywhere and other healthcare systems.  Past medical history, social, surgical and family history all reviewed in electronic medical record.  No pertanent information unless stated regarding to the chief complaint.   Past Medical History:  Diagnosis Date   Abnormal heart rhythm    History of chicken pox    POTS (postural orthostatic tachycardia syndrome)     Allergies  Allergen Reactions   Gluten Meal      Review of Systems:  No headache, visual changes, nausea, vomiting, diarrhea, constipation, dizziness, abdominal pain, skin rash, fevers, chills, night sweats, weight loss, swollen lymph nodes, body aches, joint swelling, chest pain, shortness of breath, mood changes. POSITIVE muscle aches  Objective  Blood pressure 120/84, pulse 64,  height 5\' 6"  (1.676 m), weight 123 lb (55.8 kg), SpO2 99 %.   General: No apparent distress alert and oriented x3 mood and affect normal, dressed appropriately.  HEENT: Pupils equal, extraocular movements intact  Respiratory: Patient's speak in full sentences and does not appear short of breath  Cardiovascular: No lower extremity edema, non tender, no erythema  Patient does have some hypermobility noted.  Some tenderness to palpation in the left parascapular region and the left sacroiliac joint.  Patient does have a mild anterior ilium noted.  Osteopathic findings  C6 flexed rotated and side bent left T3 extended rotated and side bent left inhaled rib L2 flexed rotated and side bent right Sacrum left on left Anterior ilium left side      Assessment and Plan:  SI (sacroiliac) joint dysfunction Chronic, with mild exacerbation.  Significant more left-sided than right-sided.  Persistent working on hip abductor strengthening.  Discussed icing regimen and home exercises.  Increase activity slowly.  Patient is working with a at this time.  Hopefully patient will do relatively well.  Follow-up with me again in 6 to 8 weeks.  Has the muscle relaxer for breakthrough as well.  Continue on the Effexor at this time.   Nonallopathic problems  Decision today to treat with OMT was based on Physical Exam  After verbal consent patient was treated with HVLA, ME, FPR techniques in cervical, rib, thoracic, lumbar, and sacral  areas  Patient tolerated the procedure well with improvement in symptoms  Patient given exercises, stretches and lifestyle modifications  See medications in patient instructions if given  Patient will follow up in  4-8 weeks      The above documentation has been reviewed and is accurate and complete Judi Saa, DO       Note: This dictation was prepared with Dragon dictation along with smaller phrase technology. Any transcriptional errors that result from  this process are unintentional.

## 2021-04-22 ENCOUNTER — Encounter: Payer: Self-pay | Admitting: Family Medicine

## 2021-04-22 ENCOUNTER — Other Ambulatory Visit: Payer: Self-pay

## 2021-04-22 ENCOUNTER — Ambulatory Visit: Payer: No Typology Code available for payment source | Admitting: Family Medicine

## 2021-04-22 VITALS — BP 120/84 | HR 64 | Ht 66.0 in | Wt 123.0 lb

## 2021-04-22 DIAGNOSIS — M9903 Segmental and somatic dysfunction of lumbar region: Secondary | ICD-10-CM | POA: Diagnosis not present

## 2021-04-22 DIAGNOSIS — M9904 Segmental and somatic dysfunction of sacral region: Secondary | ICD-10-CM | POA: Diagnosis not present

## 2021-04-22 DIAGNOSIS — M9901 Segmental and somatic dysfunction of cervical region: Secondary | ICD-10-CM | POA: Diagnosis not present

## 2021-04-22 DIAGNOSIS — M9902 Segmental and somatic dysfunction of thoracic region: Secondary | ICD-10-CM | POA: Diagnosis not present

## 2021-04-22 DIAGNOSIS — M533 Sacrococcygeal disorders, not elsewhere classified: Secondary | ICD-10-CM

## 2021-04-22 DIAGNOSIS — M9908 Segmental and somatic dysfunction of rib cage: Secondary | ICD-10-CM

## 2021-04-22 MED ORDER — MELOXICAM 15 MG PO TABS: 15.0000 mg | ORAL_TABLET | Freq: Every day | ORAL | 0 refills | Status: DC

## 2021-04-22 NOTE — Patient Instructions (Signed)
Refill Meloxicam taek 5-7 days when needed See me again in 5-6 weeks

## 2021-04-22 NOTE — Assessment & Plan Note (Signed)
Chronic, with mild exacerbation.  Significant more left-sided than right-sided.  Persistent working on hip abductor strengthening.  Discussed icing regimen and home exercises.  Increase activity slowly.  Patient is working with a Psychologist, educational at this time.  Hopefully patient will do relatively well.  Follow-up with me again in 6 to 8 weeks.  Has the muscle relaxer for breakthrough as well.  Continue on the Effexor at this time.

## 2021-04-28 ENCOUNTER — Ambulatory Visit: Payer: No Typology Code available for payment source | Admitting: Family Medicine

## 2021-05-19 ENCOUNTER — Other Ambulatory Visit: Payer: Self-pay | Admitting: Family Medicine

## 2021-05-21 ENCOUNTER — Other Ambulatory Visit: Payer: Self-pay | Admitting: Family Medicine

## 2021-05-31 NOTE — Progress Notes (Signed)
[name] [address] [place] [address] Phone: [phone] Subjective:   Debbie [name], am serving as a scribe for Dr. Antoine Primas. This visit occurred during the SARS-CoV-2 public health emergency.  Safety protocols were in place, including screening questions prior to the visit, additional usage of staff PPE, and extensive cleaning of exam room while observing appropriate contact time as indicated for disinfecting solutions.   I'm seeing this patient by the request  of:  Sandford Craze, NP  CC: neck and back pain   [id number]  Debbie [name] is a 36 y.o. female coming in with complaint of back and neck pain. OMT 04/22/2021. Patient states that she has been strength training. Pain with rowing in L elbow. Pain in both epicondyles.Thoracic spine pain is the same as last visit.   Medications patient has been prescribed: Voltaren, Meloxicam, Effexor  Taking: yes         Reviewed prior external information including notes and imaging from previsou exam, outside providers and external EMR if available.   As well as notes that were available from care everywhere and other healthcare systems.  Past medical history, social, surgical and family history all reviewed in electronic medical record.  No pertanent information unless stated regarding to the chief complaint.   Past Medical History:  Diagnosis Date   Abnormal heart rhythm    History of chicken pox    POTS (postural orthostatic tachycardia syndrome)     Allergies  Allergen Reactions   Gluten Meal      Review of Systems:  No headache, visual changes, nausea, vomiting, diarrhea, constipation, dizziness, abdominal pain, skin rash, fevers, chills, night sweats, weight loss, swollen lymph nodes, body aches, joint swelling, chest pain, shortness of breath, mood changes. POSITIVE muscle aches  Objective  Blood pressure 108/74, pulse 78, height 5\' 6"  (1.676 m), weight  126 lb (57.2 kg), SpO2 98 %.   General: No apparent distress alert and oriented x3 mood and affect normal, dressed appropriately.  HEENT: Pupils equal, extraocular movements intact  Respiratory: Patient's speak in full sentences and does not appear short of breath  Cardiovascular: No lower extremity edema, non tender, no erythema  Low back exam shows some tenderness to palpation in the paraspinal musculature.  Hypomobility noted.  Patient Right tricep mild tenderness      Osteopathic findings  C2 flexed rotated and side bent right C6 flexed rotated and side bent left T3 extended rotated and side bent right inhaled rib T7 extended rotated and side bent left L2 flexed rotated and side bent right Sacrum right on right       Assessment and Plan: SI (sacroiliac) joint dysfunction Patient is doing much better overall.  Discussed icing regimen and home exercises. Very well at the moment with the Effexor.  Does have the muscle laxer.  Do feel that the muscle strengthening is improved.  Does have the muscle.  Follow-up with me again in 6 to    Nonallopathic problems  Decision today to treat with OMT was based on Physical Exam  After verbal consent patient was treated with HVLA, ME, FPR techniques in cervical, rib, thoracic, lumbar, and sacral  areas  Patient tolerated the procedure well with improvement in symptoms  Patient given exercises, stretches and lifestyle modifications  See medications in patient instructions if given  Patient will follow up in 4-8 weeks      The above documentation has been reviewed and is accurate and complete  [name], DO        Note: This dictation was prepared with Dragon dictation along with smaller phrase technology. Any transcriptional errors that result from this process are unintentional.

## 2021-06-01 ENCOUNTER — Ambulatory Visit: Payer: No Typology Code available for payment source | Admitting: Family Medicine

## 2021-06-01 ENCOUNTER — Encounter: Payer: Self-pay | Admitting: Family Medicine

## 2021-06-01 ENCOUNTER — Other Ambulatory Visit: Payer: Self-pay

## 2021-06-01 VITALS — BP 108/74 | HR 78 | Ht 66.0 in | Wt 126.0 lb

## 2021-06-01 DIAGNOSIS — M9903 Segmental and somatic dysfunction of lumbar region: Secondary | ICD-10-CM

## 2021-06-01 DIAGNOSIS — M9908 Segmental and somatic dysfunction of rib cage: Secondary | ICD-10-CM | POA: Diagnosis not present

## 2021-06-01 DIAGNOSIS — M9904 Segmental and somatic dysfunction of sacral region: Secondary | ICD-10-CM | POA: Diagnosis not present

## 2021-06-01 DIAGNOSIS — M9902 Segmental and somatic dysfunction of thoracic region: Secondary | ICD-10-CM

## 2021-06-01 DIAGNOSIS — M9901 Segmental and somatic dysfunction of cervical region: Secondary | ICD-10-CM

## 2021-06-01 DIAGNOSIS — M533 Sacrococcygeal disorders, not elsewhere classified: Secondary | ICD-10-CM

## 2021-06-01 NOTE — Patient Instructions (Signed)
Good to see you Body Helix for elbow sleeve Avoid full extension and flexion Think weight training is fantastic See me in 6-8 weeks

## 2021-06-01 NOTE — Assessment & Plan Note (Signed)
Patient is doing much better overall.  Discussed icing regimen and home exercises. Very well at the moment with the Effexor.  Does have the muscle laxer.  Do feel that the muscle strengthening is improved.  Does have the muscle.  Follow-up with me again in 6 to

## 2021-06-26 ENCOUNTER — Other Ambulatory Visit: Payer: Self-pay | Admitting: Family Medicine

## 2021-07-01 ENCOUNTER — Encounter: Payer: Self-pay | Admitting: Family Medicine

## 2021-07-02 MED ORDER — PREDNISONE 50 MG PO TABS: 50.0000 mg | ORAL_TABLET | Freq: Every day | ORAL | 0 refills | Status: DC

## 2021-07-12 NOTE — Progress Notes (Signed)
?[facility] ? [facility] ?[address] [place] [address] ?Phone: [phone] ?Subjective:   ?I, [name], am serving as a scribe for Dr. Antoine Primas. ? ?I'm seeing this patient by the request  of:  Sandford Craze, NP ? ?CC: Back pain follow-up ? ?[id number]  ?Debbie [name] is a 36 y.o. female coming in with complaint of back and neck pain. OMT 06/01/2021. Patient states was at a conference a couple weeks ago and the prednisone helped get it back to base line but is still tight.  Patient is very frustrated, does not feel like she has made any significant improvement at this time and is concerned to even travel secondary to the possibility of worsening pain.  Patient would like to try something more aggressive. ? ?Medications patient has been prescribed: , prednisone,Zanaflex, Meloxicam, Effexor ? ?Taking: Effexor, Zanaflex  ? ? ?  ? ? ? ? ?Reviewed prior external information including notes and imaging from previsou exam, outside providers and external EMR if available.  ? ?As well as notes that were available from care everywhere and other healthcare systems. ? ?Past medical history, social, surgical and family history all reviewed in electronic medical record.  No pertanent information unless stated regarding to the chief complaint.  ? ?Past Medical History:  ?Diagnosis Date  ? Abnormal heart rhythm   ? History of chicken pox   ? POTS (postural orthostatic tachycardia syndrome)   ?  ?Allergies  ?Allergen Reactions  ? Gluten Meal   ? ? ? ?Review of Systems: ? No headache, visual changes, nausea, vomiting, diarrhea, constipation, dizziness, abdominal pain, skin rash, fevers, chills, night sweats, weight loss, swollen lymph nodes, body aches, joint swelling, chest pain, shortness of breath, mood changes. POSITIVE muscle aches ? ?Objective  ?Blood pressure 110/80, pulse 76, height 5\' 6"  (1.676 m), weight 126 lb (57.2 kg), SpO2 99 %. ?  ?General: No apparent distress  alert and oriented x3 mood and affect normal, dressed appropriately.  ?HEENT: Pupils equal, extraocular movements intact  ?Respiratory: Patient's speak in full sentences and does not appear short of breath  ?Cardiovascular: No lower extremity edema, non tender, no erythema  ?Patient's low back significant pain over the coccyx bone itself.  Patient does not have any pain though with FABER test. ?Patient is back relatively straight at the moment. ? ? ?Procedure: Real-time Ultrasound Guided Injection of coccygeal block ?Device: GE Logiq Q7 ?Ultrasound guided injection is preferred based studies that show increased duration, increased effect, greater accuracy, decreased procedural pain, increased response rate, and decreased cost with ultrasound guided versus blind injection.  ?Verbal informed consent obtained.  ?Time-out conducted.  ?Noted no overlying erythema, induration, or other signs of local infection.  ?Skin prepped in a sterile fashion.  ?Local anesthesia: Topical Ethyl chloride.  ?With sterile technique and under real time ultrasound guidance: With a 21-gauge 2 inch needle injected from a right sided approach with a total of 0.5 cc of 0.5% Marcaine and 0.5 cc of Kenalog 40 mg/mL just deep to the tip of the coccyx ?Completed without difficulty  ?Pain immediately improved suggesting accurate placement of the medication.  ?Advised to call if fevers/chills, erythema, induration, drainage, or persistent bleeding.  ?Impression: Technically successful ultrasound guided injection. ? ? ? ?  ?Assessment and Plan: ? ?Coccydynia ?Chronic problem with exacerbation.  Still giving her difficulty.  Unable to sit for a long amount of time secondary to this pain.  Has been going on for quite a while.  Discussed  with patient about icing regimen and home exercises, which activities to do and which ones to avoid.  Increase activity slowly.  Follow-up with me again in 6 weeks and likely restart osteopathic manipulation. ?  ? ? ? ?   ?The above documentation has been reviewed and is accurate and complete Judi Saa, DO ? ? ? ?  ? ? Note: This dictation was prepared with Dragon dictation along with smaller phrase technology. Any transcriptional errors that result from this process are unintentional.    ?  ?  ? ?

## 2021-07-13 ENCOUNTER — Ambulatory Visit: Payer: No Typology Code available for payment source | Admitting: Family Medicine

## 2021-07-13 VITALS — BP 110/80 | HR 76 | Ht 66.0 in | Wt 126.0 lb

## 2021-07-13 DIAGNOSIS — M533 Sacrococcygeal disorders, not elsewhere classified: Secondary | ICD-10-CM | POA: Diagnosis not present

## 2021-07-13 NOTE — Assessment & Plan Note (Signed)
Chronic problem with exacerbation.  Still giving her difficulty.  Unable to sit for a long amount of time secondary to this pain.  Has been going on for quite a while.  Discussed with patient about icing regimen and home exercises, which activities to do and which ones to avoid.  Increase activity slowly.  Follow-up with me again in 6 weeks and likely restart osteopathic manipulation. ?

## 2021-07-13 NOTE — Patient Instructions (Addendum)
Good to see you  ?Coccygeal injection given today  ?May take up to two weeks to work ? ?Call or go to the ER if you develop a large red swollen joint with extreme pain or oozing puss.  ? ? ?Send me a message in a week and let me know how you are doing  ?Follow up in 5-6 weeks before graduation ?

## 2021-07-29 ENCOUNTER — Encounter: Payer: Self-pay | Admitting: Family Medicine

## 2021-08-06 ENCOUNTER — Encounter: Payer: Self-pay | Admitting: Gastroenterology

## 2021-08-11 NOTE — Progress Notes (Signed)
?Debbie [name] D.O. ?[facility] ?[address] [place] [address] ?Phone: [phone] ?Subjective:   ?I, Debbie [name], am serving as a Neurosurgeon for Dr. Antoine [name]. ?This visit occurred during the SARS-CoV-2 public health emergency.  Safety protocols were in place, including screening questions prior to the visit, additional usage of staff PPE, and extensive cleaning of exam room while observing appropriate contact time as indicated for disinfecting solutions.  ? ?I'm seeing this patient by the request  of:  Debbie Craze, NP ? ?CC: Back pain and buttock pain ? ?[id number]  ?07/13/2021 ?Chronic problem with exacerbation.  Still giving her difficulty.  Unable to sit for a long amount of time secondary to this pain.  Has been going on for quite a while.  Discussed with patient about icing regimen and home exercises, which activities to do and which ones to avoid.  Increase activity slowly.  Follow-up with me again in 6 weeks and likely restart osteopathic manipulation. ? ?Updated 08/12/2021 ?Debbie [name] is a 36 y.o. female coming in with complaint of tailbone pain. No change in pain or location.  Patient last exam was given a coccygeal block.  Patient states it was initially but then did not make any significant improvement for any number of the days.  Patient states that he continues to have discomfort and pain as well.  Type of sitting gives him trouble.  Does state that she has had some more stomach bloating than usual. ? ? ? ?  ? ?Past Medical History:  ?Diagnosis Date  ? Abnormal heart rhythm   ? History of chicken pox   ? POTS (postural orthostatic tachycardia syndrome)   ? ?Past Surgical History:  ?Procedure Laterality Date  ? TONSILLECTOMY AND ADENOIDECTOMY  1997  ? WISDOM TOOTH EXTRACTION    ? all 4  ? ?Social History  ? ?Socioeconomic History  ? Marital status: Single  ?  Spouse name: Not on file  ? Number of children: 0  ? Years of education: Not on file  ? Highest  education level: Not on file  ?Occupational History  ? Occupation: university professor  ?Tobacco Use  ? Smoking status: Never  ? Smokeless tobacco: Never  ?Vaping Use  ? Vaping Use: Never used  ?Substance and Sexual Activity  ? Alcohol use: No  ? Drug use: No  ? Sexual activity: Yes  ?  Birth control/protection: None  ?Other Topics Concern  ? Not on file  ?Social History Narrative  ? Philosophy professor at [employer]  ? PhD  ? Single  ? Lives in apartment  ? Enjoys resting, running, reading  ?   ? ?Social Determinants of Health  ? ?Financial Resource Strain: Not on file  ?Food Insecurity: Not on file  ?Transportation Needs: Not on file  ?Physical Activity: Not on file  ?Stress: Not on file  ?Social Connections: Not on file  ? ?Allergies  ?Allergen Reactions  ? Gluten Meal   ? ?Family History  ?Problem Relation Age of Onset  ? Osteoporosis Mother   ? Hyperlipidemia Father   ? Heart disease Father   ?     CAD/defibrillator age 31  ? Hypertension Father   ? Diabetes Father   ?     type 2  ? Colon cancer Neg Hx   ? Esophageal cancer Neg Hx   ? Stomach cancer Neg Hx   ? ? ?Current Outpatient Medications (Endocrine & Metabolic):  ?  predniSONE (DELTASONE) 50 MG tablet, Take 1 tablet (  50 mg total) by mouth daily. ? ? ? ?Current Outpatient Medications (Analgesics):  ?  diclofenac (VOLTAREN) 75 MG EC tablet, TAKE 1 TABLET BY MOUTH 2 TIMES DAILY AS NEEDED. ?  meloxicam (MOBIC) 15 MG tablet, TAKE 1 TABLET (15 MG TOTAL) BY MOUTH DAILY. ? ? ?Current Outpatient Medications (Other):  ?  venlafaxine XR (EFFEXOR XR) 37.5 MG 24 hr capsule, Take 3 capsules (112.5 mg total) by mouth daily with breakfast. ?  gabapentin (NEURONTIN) 100 MG capsule, Take 2 capsules (200 mg total) by mouth at bedtime. ?  tiZANidine (ZANAFLEX) 2 MG tablet, TAKE 1 TABLET BY MOUTH EVERYDAY AT BEDTIME ? ? ?Reviewed prior external information including notes and imaging from  ?primary care provider ?As well as notes that were available from care everywhere  and other healthcare systems. ? ?Past medical history, social, surgical and family history all reviewed in electronic medical record.  No pertanent information unless stated regarding to the chief complaint.  ? ?Review of Systems: ? No headache, visual changes, nausea, vomiting, diarrhea, constipation, dizziness, abdominal pain, skin rash, fevers, chills, night sweats, weight loss, swollen lymph nodes, body aches, joint swelling, chest pain, shortness of breath, mood changes. POSITIVE muscle aches ? ?Objective  ?Blood pressure 102/64, pulse 86, height 5\' 6"  (1.676 m), weight 127 lb (57.6 kg), SpO2 98 %. ?  ?General: No apparent distress alert and oriented x3 mood and affect normal, dressed appropriately.  ?HEENT: Pupils equal, extraocular movements intact  ?Respiratory: Patient's speak in full sentences and does not appear short of breath  ?Cardiovascular: No lower extremity edema, non tender, no erythema  ?Gait normal with good balance and coordination.  ?MSK: Back exam still has some mild loss of lordosis.  Patient does have negative FABER test though.  Patient is severely tender to palpation over the coccyx bone itself.  Abdomen exam shows some mild bloating of the lower quadrants bilaterally but no masses appreciated. ? ?Osteopathic findings ?C2 flexed rotated and side bent right ?T2 extended rotated and side bent right inhaled third rib ?L3 flexed rotated and side bent right ?Sacrum right on right ?Pelvic shear noted ? ?  ?Impression and Recommendations:  ?  ?The above documentation has been reviewed and is accurate and complete , DO ? ? ? ?

## 2021-08-12 ENCOUNTER — Ambulatory Visit: Payer: No Typology Code available for payment source | Admitting: Family Medicine

## 2021-08-12 VITALS — BP 102/64 | HR 86 | Ht 66.0 in | Wt 127.0 lb

## 2021-08-12 DIAGNOSIS — M9903 Segmental and somatic dysfunction of lumbar region: Secondary | ICD-10-CM | POA: Diagnosis not present

## 2021-08-12 DIAGNOSIS — M9904 Segmental and somatic dysfunction of sacral region: Secondary | ICD-10-CM | POA: Diagnosis not present

## 2021-08-12 DIAGNOSIS — M533 Sacrococcygeal disorders, not elsewhere classified: Secondary | ICD-10-CM

## 2021-08-12 DIAGNOSIS — M9901 Segmental and somatic dysfunction of cervical region: Secondary | ICD-10-CM

## 2021-08-12 DIAGNOSIS — M999 Biomechanical lesion, unspecified: Secondary | ICD-10-CM

## 2021-08-12 DIAGNOSIS — M9908 Segmental and somatic dysfunction of rib cage: Secondary | ICD-10-CM

## 2021-08-12 DIAGNOSIS — M9902 Segmental and somatic dysfunction of thoracic region: Secondary | ICD-10-CM | POA: Diagnosis not present

## 2021-08-12 MED ORDER — VENLAFAXINE HCL ER 37.5 MG PO CP24: 112.5000 mg | ORAL_CAPSULE | Freq: Every day | ORAL | 2 refills | Status: DC

## 2021-08-12 NOTE — Assessment & Plan Note (Signed)
Continues to have significant discomfort.  Attempted injection with no significant improvement.  Patient now needs to consider the possibility of this not being as most muscular in nature.  At this time we will get a CT abdomen and pelvis to further evaluate for any other bony abnormality, any type of occult hernia, any occult fracture, as well as any other gynecological or gastroenterology pathology that could be contributing.  In addition to this we will refer patient to gastroenterology for further evaluation for possible other pathology that could be contributing.  Seems to be point tenderness but continues to have difficulty.  Follow-up with me again in 6 to 8 weeks if manipulation is helpful.  We will start trying to titrate down off of the Effexor slowly over the course of time and see if this is still necessary. ?

## 2021-08-12 NOTE — Assessment & Plan Note (Signed)
   Decision today to treat with OMT was based on Physical Exam  After verbal consent patient was treated with HVLA, ME, FPR techniques in cervical, thoracic, rib, lumbar and sacral and pelvis  areas, all areas are chronic   Patient tolerated the procedure well with improvement in symptoms  Patient given exercises, stretches and lifestyle modifications  See medications in patient instructions if given  Patient will follow up in 4-8 weeks 

## 2021-08-12 NOTE — Patient Instructions (Addendum)
Sorry you're not better ?I do think seeing GI and colonoscopy would be a good idea ? ?[facility] [phone] ? ?When we receive your results we will contact you. ? ?See you again in 6-8 weeks for manipulation ?

## 2021-08-19 ENCOUNTER — Ambulatory Visit
Admission: RE | Admit: 2021-08-19 | Discharge: 2021-08-19 | Disposition: A | Discharge status: Home / Self Care | Payer: No Typology Code available for payment source | Source: Ambulatory Visit | Attending: Family Medicine | Admitting: Family Medicine

## 2021-08-19 DIAGNOSIS — M533 Sacrococcygeal disorders, not elsewhere classified: Secondary | ICD-10-CM

## 2021-08-19 IMAGING — CT CT ABD-PELV W/O CM
1 of 2 series · 15 of 32 positions shown, 19 images · non-contrast
Comparison: None Available.

CLINICAL DATA: Abdominal pain



[Series 2: a/p w/o 5mm · axial · non-contrast · 0.81mm/px · z∈[-556,-101]mm · 15 of 99 slices shown, 19 images]
[im 4/99  soft-tissue]
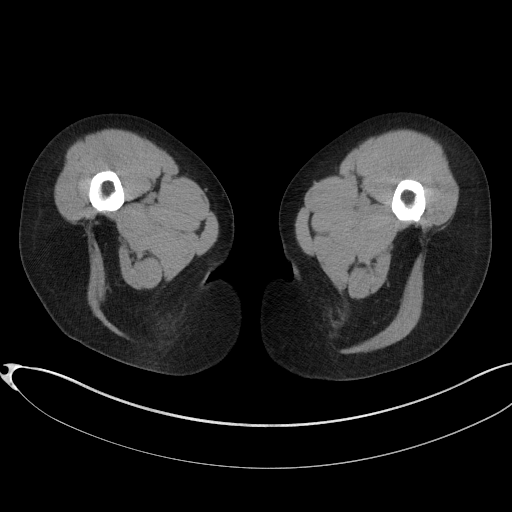
[im 4/99  bone]
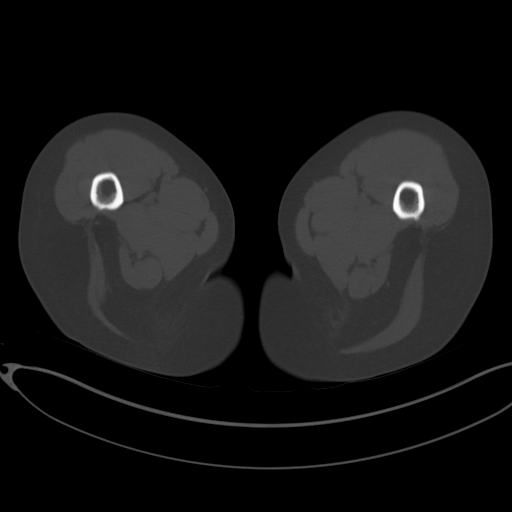
[im 12/99  soft-tissue]
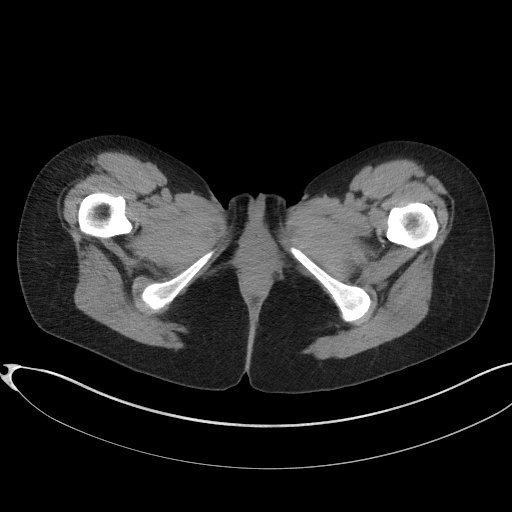
[im 20/99  soft-tissue]
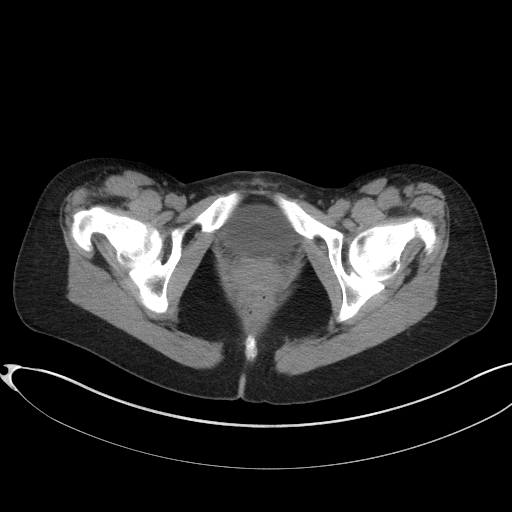
[im 28/99  soft-tissue]
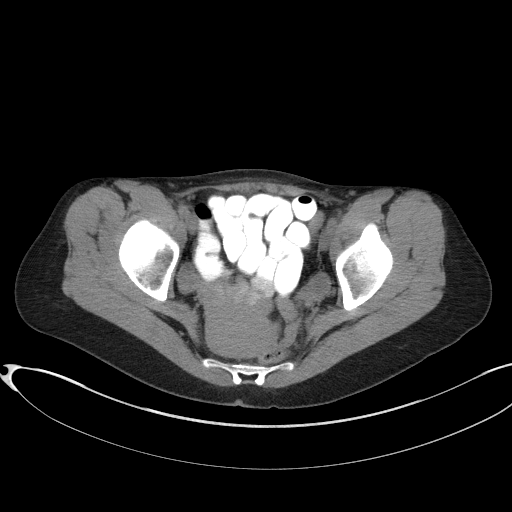
[im 36/99  soft-tissue]
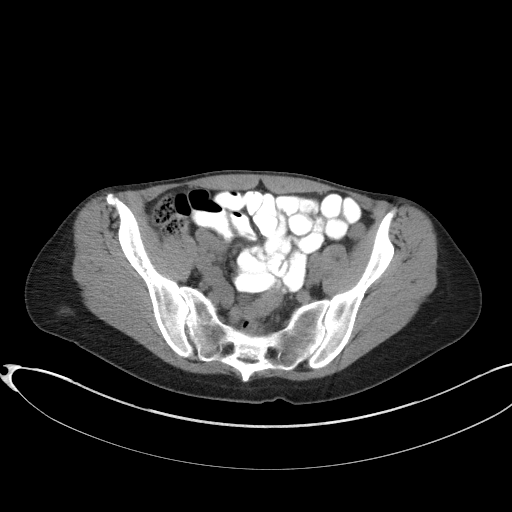
[im 44/99  soft-tissue]
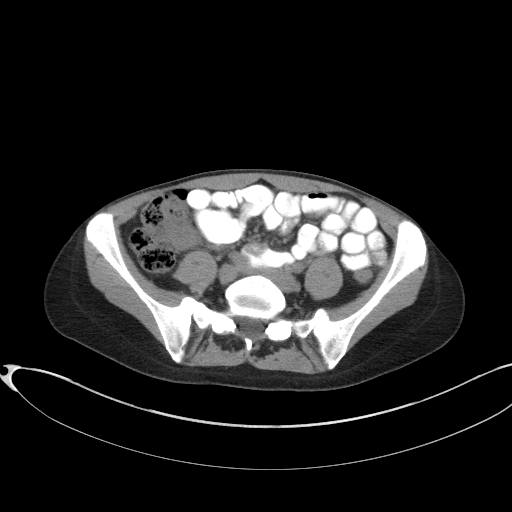
[im 51/99  soft-tissue]
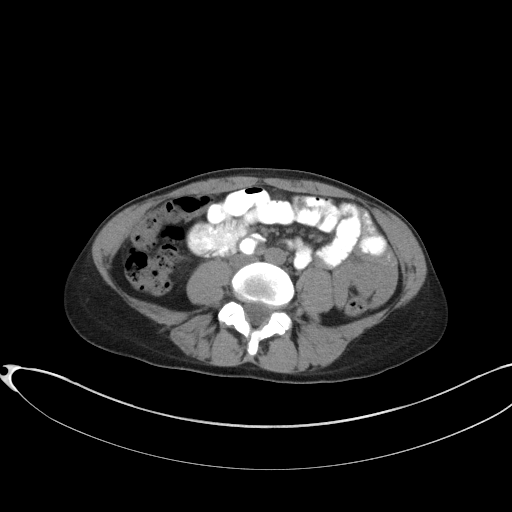
[im 55/99  soft-tissue]
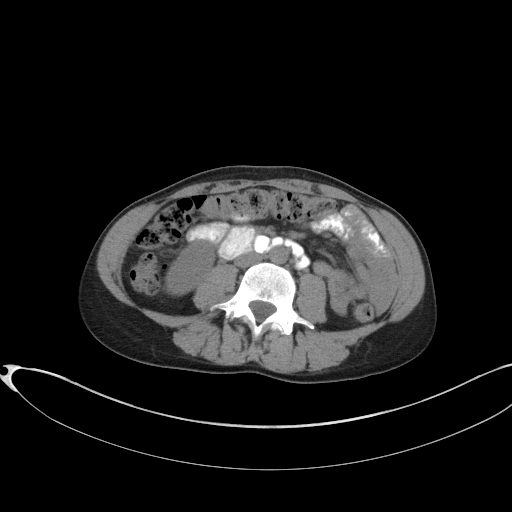
[im 63/99  soft-tissue]
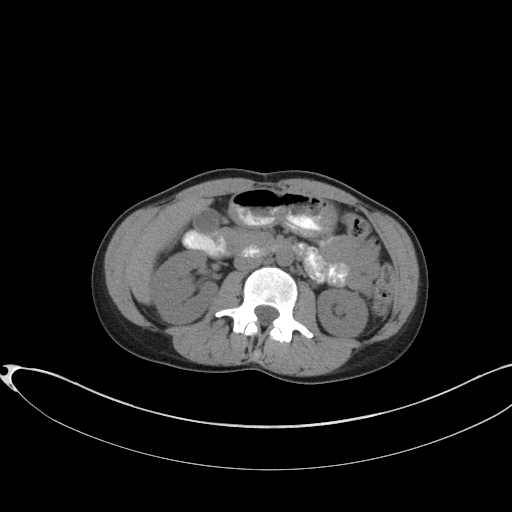
[im 63/99  bone]
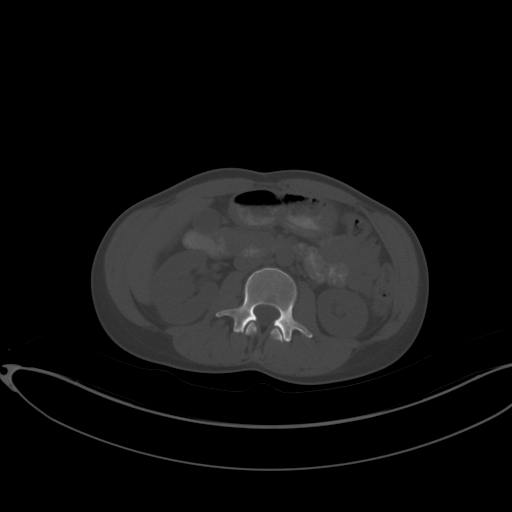
[im 71/99  soft-tissue]
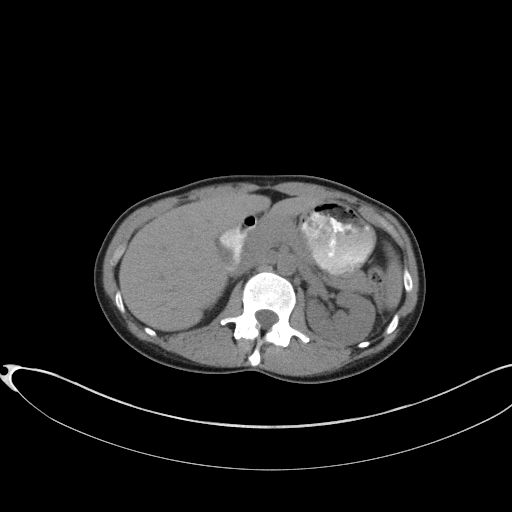
[im 79/99  soft-tissue]
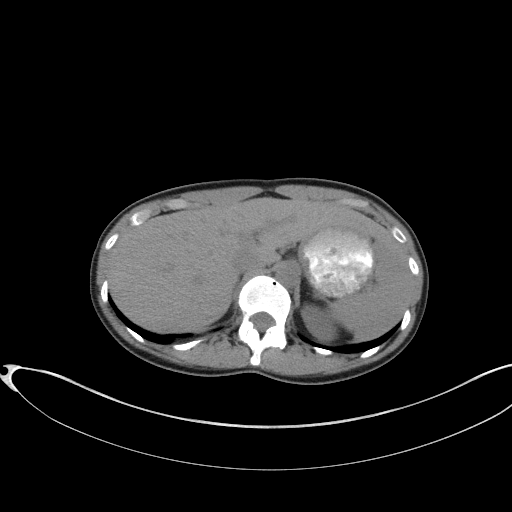
[im 83/99  lung]
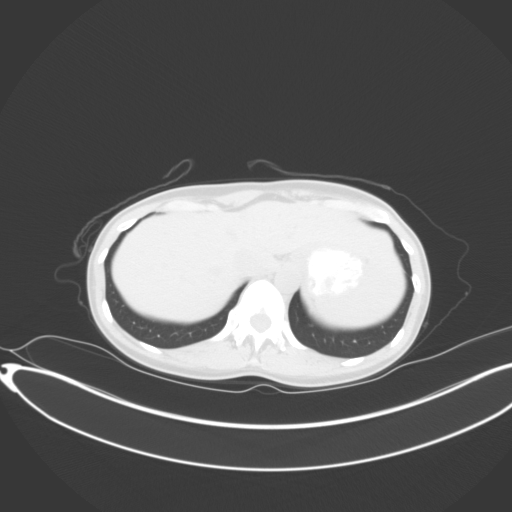
[im 87/99  soft-tissue]
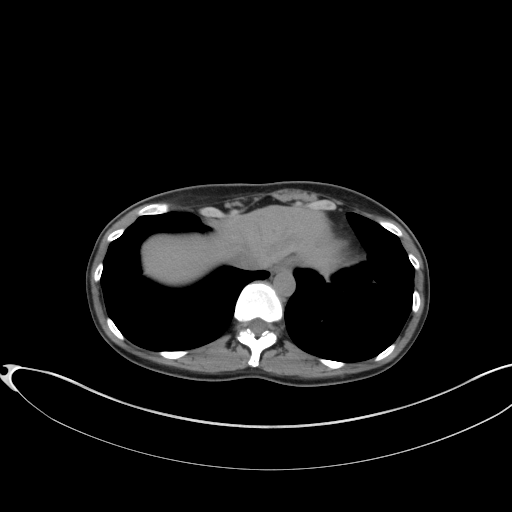
[im 87/99  lung]
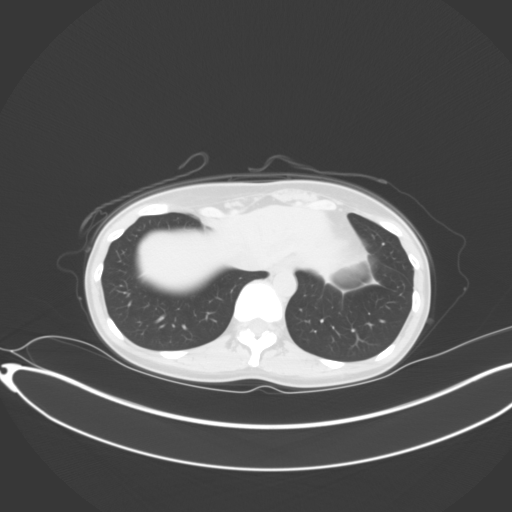
[im 91/99  lung]
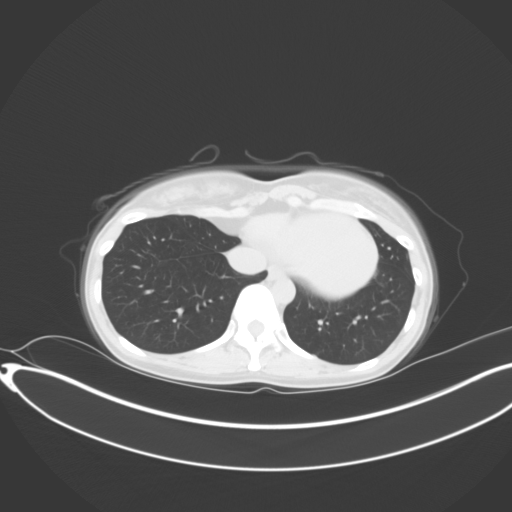
[im 95/99  soft-tissue]
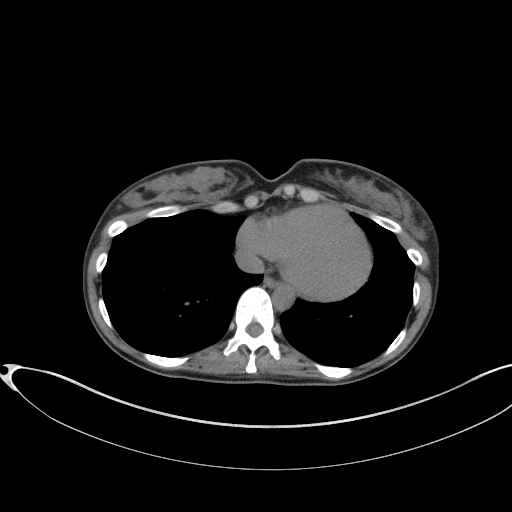
[im 95/99  lung]
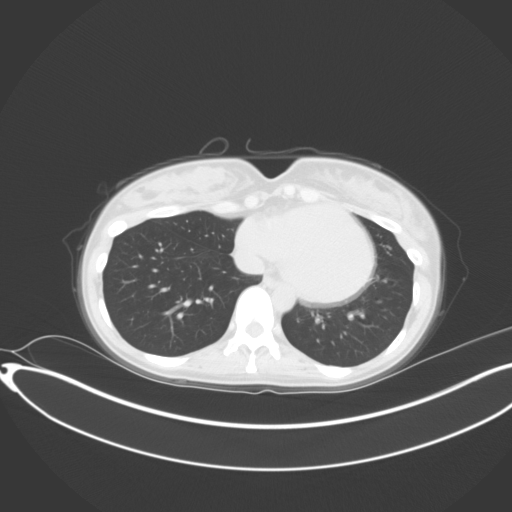

[15 of 32 positions shown; findings below may reference images not displayed]

FINDINGS: Lower chest: No acute abnormality.

Hepatobiliary: No focal liver abnormality is seen. No gallstones,
gallbladder wall thickening, or biliary dilatation.

Pancreas: Unremarkable. No pancreatic ductal dilatation or
surrounding inflammatory changes.

Spleen: Normal in size without focal abnormality.

Adrenals/Urinary Tract: Adrenal glands are normal. No
nephrolithiasis or hydronephrosis identified bilaterally. Urinary
bladder appears normal.

Stomach/Bowel: No bowel obstruction, free air or pneumatosis. No
bowel wall edema identified. Moderate amount of retained fecal
material visualized throughout the colon. No evidence of acute
appendicitis.

Vascular/Lymphatic: No significant vascular findings are present. No
enlarged abdominal or pelvic lymph nodes.

Reproductive: Uterus and bilateral adnexa are unremarkable.

Other: No ascites.

Musculoskeletal: No acute or significant osseous findings.
IMPRESSION: No acute process identified.

## 2021-09-02 ENCOUNTER — Ambulatory Visit: Payer: No Typology Code available for payment source | Admitting: Gastroenterology

## 2021-09-02 ENCOUNTER — Other Ambulatory Visit (INDEPENDENT_AMBULATORY_CARE_PROVIDER_SITE_OTHER): Payer: No Typology Code available for payment source

## 2021-09-02 ENCOUNTER — Encounter: Payer: Self-pay | Admitting: Gastroenterology

## 2021-09-02 VITALS — BP 120/70 | HR 73 | Ht 66.0 in | Wt 128.5 lb

## 2021-09-02 DIAGNOSIS — K581 Irritable bowel syndrome with constipation: Secondary | ICD-10-CM

## 2021-09-02 LAB — CBC WITH DIFFERENTIAL/PLATELET
Basophils Absolute: 0 10*3/uL (ref 0.0–0.1)
Basophils Relative: 0.6 % (ref 0.0–3.0)
Eosinophils Absolute: 0.1 10*3/uL (ref 0.0–0.7)
Eosinophils Relative: 1.6 % (ref 0.0–5.0)
HCT: 39.4 % (ref 36.0–46.0)
Hemoglobin: 13.1 g/dL (ref 12.0–15.0)
Lymphocytes Relative: 37.5 % (ref 12.0–46.0)
Lymphs Abs: 1.6 10*3/uL (ref 0.7–4.0)
MCHC: 33.3 g/dL (ref 30.0–36.0)
MCV: 87.8 fl (ref 78.0–100.0)
Monocytes Absolute: 0.5 10*3/uL (ref 0.1–1.0)
Monocytes Relative: 12.6 % — ABNORMAL HIGH (ref 3.0–12.0)
Neutro Abs: 2 10*3/uL (ref 1.4–7.7)
Neutrophils Relative %: 47.7 % (ref 43.0–77.0)
Platelets: 202 10*3/uL (ref 150.0–400.0)
RBC: 4.49 Mil/uL (ref 3.87–5.11)
RDW: 13.3 % (ref 11.5–15.5)
WBC: 4.2 10*3/uL (ref 4.0–10.5)

## 2021-09-02 LAB — COMPREHENSIVE METABOLIC PANEL
ALT: 14 U/L (ref 0–35)
AST: 19 U/L (ref 0–37)
Albumin: 4.3 g/dL (ref 3.5–5.2)
Alkaline Phosphatase: 45 U/L (ref 39–117)
BUN: 17 mg/dL (ref 6–23)
CO2: 25 mEq/L (ref 19–32)
Calcium: 9.2 mg/dL (ref 8.4–10.5)
Chloride: 103 mEq/L (ref 96–112)
Creatinine, Ser: 0.72 mg/dL (ref 0.40–1.20)
GFR: 106.37 mL/min (ref >60.00)
Glucose, Bld: 85 mg/dL (ref 70–99)
Potassium: 3.4 mEq/L — ABNORMAL LOW (ref 3.5–5.1)
Sodium: 138 mEq/L (ref 135–145)
Total Bilirubin: 0.8 mg/dL (ref 0.2–1.2)
Total Protein: 6.7 g/dL (ref 6.0–8.3)

## 2021-09-02 LAB — C-REACTIVE PROTEIN: CRP: <1 mg/dL (ref 0.5–20.0)

## 2021-09-02 LAB — TSH: TSH: 3.16 u[IU]/mL (ref 0.35–5.50)

## 2021-09-02 NOTE — Patient Instructions (Addendum)
If you are age 38 or older, your body mass index should be between 23-30. Your Body mass index is 20.74 kg/m. If this is out of the aforementioned range listed, please consider follow up with your Primary Care Provider.  If you are age 47 or younger, your body mass index should be between 19-25. Your Body mass index is 20.74 kg/m. If this is out of the aformentioned range listed, please consider follow up with your Primary Care Provider.   ________________________________________________________  The [facility] providers would like to encourage you to use [facility] to communicate with providers for non-urgent requests or questions.  Due to long hold times on the telephone, sending your provider a message by [facility] may be a faster and more efficient way to get a response.  Please allow 48 business hours for a response.  Please remember that this is for non-urgent requests.  _______________________________________________________  Your provider has requested that you go to the basement level for lab work before leaving today. Press "B" on the elevator. The lab is located at the first door on the left as you exit the elevator.  Please purchase the following medications over the counter and take as directed: Miralax 17g daily until large bowel movement and then 8.5g daily  Call in 2 weeks with an update to the nurse.  Colon at age 58. Please call 2 months prior to age 53 but a letter will be sent out as it gets closer.  Thank you,  Dr. Jackquline Denmark

## 2021-09-02 NOTE — Progress Notes (Signed)
Chief Complaint: FU  Referring Provider:  [name], NP      ASSESSMENT AND PLAN;   #1. IBS-C with bloating. No red flag symptoms. Neg CT AP 08/2021  Plan: -CBC, CMP, celiac, TSH, CRP -Miralax 17g po QD until large BM, then 1/2 QD -Avoid NSAIDs. -Call in 2 weeks. -Colon at age 36 for CRC screening. Earlier if continued problems/concerning S/S.   HPI:    Debbie [name] is a 36 y.o. female   C/O lower Abdo pain with bloating  x 3 to 4 years With intermittent constipation Low dose effexor helped in past Bloating is better in AM when she wakes up, then progressively gets worse Occasional pellet-like stools Bloating does get better with defecation Underwent CT Abdo/pelvis 08/2021 which was negative except for constipation.  No melena or hematochezia.  No weight loss.  She exercises regularly-swimming, running.   Has been on gluten-free diet - not very strictly. Had more problems with chicken in the past which causes her to have abdominal bloating, and occasionally pork.  She has stopped eating chicken/pork but can handle eggs.  Allergy testing was negative.  No sodas, chocolates, chewing gums, artificial sweeteners and candy. No NSAIDs  No FH of colon cancer or colon polyps.  Past GI work-up: CT Abdo/pelvis without contrast 08/20/2021 -No acute process -Moderate constipation.  H/O C. difficile colitis 02/2018, treated with vancomycin.  Has not had any antibiotics in the last 3 years.  SH- PHD, teaches philosophy at [employer]  Past Medical History:  Diagnosis Date   Abnormal heart rhythm    History of chicken pox    POTS (postural orthostatic tachycardia syndrome)     Past Surgical History:  Procedure Laterality Date   TONSILLECTOMY AND ADENOIDECTOMY  1997   WISDOM TOOTH EXTRACTION     all 4    Family History  Problem Relation Age of Onset   Osteoporosis Mother    Hyperlipidemia Father    Heart disease Father        CAD/defibrillator  age 30   Hypertension Father    Diabetes Father        type 2   Colon cancer Neg Hx    Esophageal cancer Neg Hx    Stomach cancer Neg Hx     Social History   Tobacco Use   Smoking status: Never   Smokeless tobacco: Never  Vaping Use   Vaping Use: Never used  Substance Use Topics   Alcohol use: No   Drug use: No    Current Outpatient Medications  Medication Sig Dispense Refill   gabapentin (NEURONTIN) 100 MG capsule Take 2 capsules (200 mg total) by mouth at bedtime. 180 capsule 0   meloxicam (MOBIC) 15 MG tablet TAKE 1 TABLET (15 MG TOTAL) BY MOUTH DAILY. 30 tablet 0   tiZANidine (ZANAFLEX) 2 MG tablet TAKE 1 TABLET BY MOUTH EVERYDAY AT BEDTIME (Patient taking differently: daily as needed.) 90 tablet 1   venlafaxine XR (EFFEXOR XR) 37.5 MG 24 hr capsule Take 3 capsules (112.5 mg total) by mouth daily with breakfast. 90 capsule 2   No current facility-administered medications for this visit.    Allergies  Allergen Reactions   Gluten Meal     Review of Systems:  Constitutional: Denies fever, chills, diaphoresis, appetite change and fatigue.  HEENT: Denies photophobia, eye pain, redness, hearing loss, ear pain, congestion, sore throat, rhinorrhea, sneezing, mouth sores, neck pain, neck stiffness and tinnitus.   Respiratory: Denies SOB, DOE, cough,  chest tightness,  and wheezing.   Cardiovascular: Denies chest pain, palpitations and leg swelling.  Genitourinary: Denies dysuria, urgency, frequency, hematuria, flank pain and difficulty urinating.  Musculoskeletal: Denies myalgias, back pain, joint swelling, arthralgias and gait problem.  Skin: No rash.  Neurological: Denies dizziness, seizures, syncope, weakness, light-headedness, numbness and headaches.  Hematological: Denies adenopathy. Easy bruising, personal or family bleeding history  Psychiatric/Behavioral: No anxiety or depression     Physical Exam:    BP 120/70   Pulse 73   Ht 5\' 6"  (1.676 m)   Wt 128 lb 8 oz  (58.3 kg)   LMP 08/19/2021 (Exact Date)   SpO2 99%   BMI 20.74 kg/m  Wt Readings from Last 3 Encounters:  09/02/21 128 lb 8 oz (58.3 kg)  08/12/21 127 lb (57.6 kg)  07/13/21 126 lb (57.2 kg)   Constitutional:  Well-developed, in no acute distress. Psychiatric: Normal mood and affect. Behavior is normal. HEENT: Pupils normal.  Conjunctivae are normal. No scleral icterus. Neck supple.  Cardiovascular: Normal rate, regular rhythm. No edema Pulmonary/chest: Effort normal and breath sounds normal. No wheezing, rales or rhonchi. Abdominal: Soft, nondistended. Nontender. Bowel sounds active throughout. There are no masses palpable. No hepatomegaly. Rectal: Deferred Neurological: Alert and oriented to person place and time. Skin: Skin is warm and dry. No rashes noted.  Data Reviewed: I have personally reviewed following labs and imaging studies  CBC:    Latest Ref Rng & Units 04/03/2019   11:54 AM 11/02/2015    3:35 PM  CBC  WBC 4.0 - 10.5 K/uL 6.0   5.8    Hemoglobin 12.0 - 15.0 g/dL 14.5   13.5    Hematocrit 36.0 - 46.0 % 43.0   40.0    Platelets 150.0 - 400.0 K/uL 206.0   217.0      CMP:    Latest Ref Rng & Units 09/30/2019    3:47 PM 04/03/2019   11:54 AM 01/29/2018    4:53 PM  CMP  Glucose 70 - 99 mg/dL 97   86   107    BUN 6 - 23 mg/dL 13   22   10     Creatinine 0.40 - 1.20 mg/dL 0.76   0.70   0.70    Sodium 135 - 145 mEq/L 138   138   138    Potassium 3.5 - 5.1 mEq/L 4.2   4.0   4.1    Chloride 96 - 112 mEq/L 103   104   103    CO2 19 - 32 mEq/L 25   25   27     Calcium 8.4 - 10.5 mg/dL 9.5   9.3     9.4   9.3    Total Protein 6.0 - 8.3 g/dL  6.9   6.5    Total Bilirubin 0.2 - 1.2 mg/dL  0.8   0.7    Alkaline Phos 39 - 117 U/L  58   58    AST 0 - 37 U/L  19   14    ALT 0 - 35 U/L  14   11      GFR: CrCl cannot be calculated (Patient's most recent lab result is older than the maximum 21 days allowed.). Liver Function Tests: No results for input(s): AST, ALT,  ALKPHOS, BILITOT, PROT, ALBUMIN in the last 168 hours. No results for input(s): LIPASE, AMYLASE in the last 168 hours. No results for input(s): AMMONIA in the last 168 hours. Coagulation Profile: No  results for input(s): INR, PROTIME in the last 168 hours. HbA1C: No results for input(s): HGBA1C in the last 72 hours. Lipid Profile: No results for input(s): CHOL, HDL, LDLCALC, TRIG, CHOLHDL, LDLDIRECT in the last 72 hours. Thyroid Function Tests: No results for input(s): TSH, T4TOTAL, FREET4, T3FREE, THYROIDAB in the last 72 hours. Anemia Panel: No results for input(s): VITAMINB12, FOLATE, FERRITIN, TIBC, IRON, RETICCTPCT in the last 72 hours.  No results found for this or any previous visit (from the past 240 hour(s)).    Radiology Studies: CT ABDOMEN PELVIS WO CONTRAST  Result Date: 08/20/2021 CLINICAL DATA:  Abdominal pain EXAM: CT ABDOMEN AND PELVIS WITHOUT CONTRAST TECHNIQUE: Multidetector CT imaging of the abdomen and pelvis was performed following the standard protocol without IV contrast. RADIATION DOSE REDUCTION: This exam was performed according to the departmental dose-optimization program which includes automated exposure control, adjustment of the mA and/or kV according to patient size and/or use of iterative reconstruction technique. COMPARISON:  None Available. FINDINGS: Lower chest: No acute abnormality. Hepatobiliary: No focal liver abnormality is seen. No gallstones, gallbladder wall thickening, or biliary dilatation. Pancreas: Unremarkable. No pancreatic ductal dilatation or surrounding inflammatory changes. Spleen: Normal in size without focal abnormality. Adrenals/Urinary Tract: Adrenal glands are normal. No nephrolithiasis or hydronephrosis identified bilaterally. Urinary bladder appears normal. Stomach/Bowel: No bowel obstruction, free air or pneumatosis. No bowel wall edema identified. Moderate amount of retained fecal material visualized throughout the colon. No evidence of  acute appendicitis. Vascular/Lymphatic: No significant vascular findings are present. No enlarged abdominal or pelvic lymph nodes. Reproductive: Uterus and bilateral adnexa are unremarkable. Other: No ascites. Musculoskeletal: No acute or significant osseous findings. IMPRESSION: No acute process identified. Electronically Signed   By: Ofilia Neas M.D.   On: 08/20/2021 13:35      Carmell Austria, MD 09/02/2021, 9:21 AM  Cc: [name], NP

## 2021-09-03 ENCOUNTER — Other Ambulatory Visit: Payer: Self-pay | Admitting: Family Medicine

## 2021-09-05 ENCOUNTER — Other Ambulatory Visit: Payer: Self-pay | Admitting: Family Medicine

## 2021-09-06 LAB — CELIAC PANEL 10
Antigliadin Abs, IgA: 3 units (ref 0–19)
Endomysial IgA: NEGATIVE
Gliadin IgG: 2 units (ref 0–19)
IgA/Immunoglobulin A, Serum: 64 mg/dL — ABNORMAL LOW (ref 87–352)
Tissue Transglut Ab: <2 U/mL (ref 0–5)
Transglutaminase IgA: <2 U/mL (ref 0–3)

## 2021-09-06 MED ORDER — GABAPENTIN 100 MG PO CAPS: 200.0000 mg | ORAL_CAPSULE | Freq: Every day | ORAL | 0 refills | Status: DC

## 2021-09-08 ENCOUNTER — Telehealth: Payer: Self-pay | Admitting: Family

## 2021-09-08 NOTE — Telephone Encounter (Signed)
-----   Message from Mosie Lukes, MD sent at 09/08/2021 10:48 AM EDT ----- I don't think further Immunglobulins are necessary. Basically the number was normal. Just means there is no issue with the antibodies or an allergy.  ----- Message ----- From: Debbie Alar, NP Sent: 09/07/2021   8:43 AM EDT To: Mosie Lukes, MD  Please see labs done by GI.  This was Dr. Steve Rattler comment to me:  "Please inform the patient. 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 If she thinks it is appropriate, she would check rest of the immunoglobulins."  What do you think?  I have never checked immunoglobulins before.    Thanks,  [employer]

## 2021-09-16 NOTE — Progress Notes (Signed)
[name] [address] [place] [address] Phone: [phone] Subjective:   Debbie [name], am serving as a scribe for Dr. Antoine Primas.  I'm seeing this patient by the request  of:  Sandford Craze, NP  CC: back and neck pain   [id number]  Debbie [name] is a 36 y.o. female coming in with complaint of back and neck pain. OMT on 08/12/2021. Patient states that she is feeling the same as last visit. Pain present mostly in sacrum and coccyx. Does still have intermittent pain in thoracic spine. Has not had to use zanaflex and meloxicam since last visit.  Patient did see gastroenterology.  Did review the notes but do not feel that a colonoscopy is necessary for her at this point. Patient did have labs but no significant anemia noted.  Medications patient has been prescribed: Effexor, Zanaflex, gabapentin, mobic  Taking:         Reviewed prior external information including notes and imaging from previsou exam, outside providers and external EMR if available.   As well as notes that were available from care everywhere and other healthcare systems.  Past medical history, social, surgical and family history all reviewed in electronic medical record.  No pertanent information unless stated regarding to the chief complaint.   Past Medical History:  Diagnosis Date   Abnormal heart rhythm    History of chicken pox    POTS (postural orthostatic tachycardia syndrome)     Allergies  Allergen Reactions   Gluten Meal      Review of Systems:  No headache, visual changes, nausea, vomiting, diarrhea, constipation, dizziness, abdominal pain, skin rash, fevers, chills, night sweats, weight loss, swollen lymph nodes, body aches, joint swelling, chest pain, shortness of breath, mood changes. POSITIVE muscle aches  Objective  Blood pressure 102/78, pulse 83, height 5\' 6"  (1.676 m), weight 125 lb (56.7 kg), last menstrual period 08/19/2021,  SpO2 98 %.   General: No apparent distress alert and oriented x3 mood and affect normal, dressed appropriately.  HEENT: Pupils equal, extraocular movements intact  Respiratory: Patient's speak in full sentences and does not appear short of breath  Cardiovascular: No lower extremity edema, non tender, no erythema  Gait knee: Normal to inspection with no erythema or effusion or obvious bony abnormalities. Palpation normal with no warmth, joint line tenderness, patellar tenderness, or condyle tenderness. ROM full in flexion and extension and lower leg rotation. Ligaments with solid consistent endpoints including ACL, PCL, LCL, MCL. Negative Mcmurray's, Apley's, and Thessalonian tests. Non painful patellar compression. Patellar glide without crepitus. Patellar and quadriceps tendons unremarkable. Hamstring and quadriceps strength is normal. MSK:  Back back exam does have some loss lordosis.  Still tender to palpation over the sacroiliac joint on the coccyx area.  Patient does have tightness bilaterally with 08/21/2021 which is different than patient's hypermobility.  Mild tightness noted in the thoracic spine as well.  Osteopathic findings  C2 flexed rotated and side bent right C6 flexed rotated and side bent left T3 extended rotated and side bent right inhaled rib T9 extended rotated and side bent left L2 flexed rotated and side bent right Sacrum right on right       Assessment and Plan:  Coccydynia Unfortunately patient continues to have significant discomfort and pain.  Gabapentin does help but makes her significantly sleepy.  We discussed with patient about the possibility of changing Effexor to duloxetine and will start to titrate down to see if we can replace  see if patient does have better results with this.  Discussed which activities to do and which ones to avoid.  Will increase activity slowly.  Follow-up with me again in 6 to 8 weeks.    Nonallopathic problems  Decision  today to treat with OMT was based on Physical Exam  After verbal consent patient was treated with HVLA, ME, FPR techniques in cervical, rib, thoracic, lumbar, and sacral  areas  Patient tolerated the procedure well with improvement in symptoms  Patient given exercises, stretches and lifestyle modifications  See medications in patient instructions if given  Patient will follow up in 4-8 weeks             Note: This dictation was prepared with Dragon dictation along with smaller phrase technology. Any transcriptional errors that result from this process are unintentional.

## 2021-09-17 ENCOUNTER — Ambulatory Visit: Payer: No Typology Code available for payment source | Admitting: Family Medicine

## 2021-09-17 VITALS — BP 102/78 | HR 83 | Ht 66.0 in | Wt 125.0 lb

## 2021-09-17 DIAGNOSIS — M9902 Segmental and somatic dysfunction of thoracic region: Secondary | ICD-10-CM

## 2021-09-17 DIAGNOSIS — M9904 Segmental and somatic dysfunction of sacral region: Secondary | ICD-10-CM

## 2021-09-17 DIAGNOSIS — M9901 Segmental and somatic dysfunction of cervical region: Secondary | ICD-10-CM

## 2021-09-17 DIAGNOSIS — M9903 Segmental and somatic dysfunction of lumbar region: Secondary | ICD-10-CM

## 2021-09-17 DIAGNOSIS — M533 Sacrococcygeal disorders, not elsewhere classified: Secondary | ICD-10-CM | POA: Diagnosis not present

## 2021-09-17 DIAGNOSIS — M9908 Segmental and somatic dysfunction of rib cage: Secondary | ICD-10-CM | POA: Diagnosis not present

## 2021-09-17 MED ORDER — DULOXETINE HCL 20 MG PO CPEP: 20.0000 mg | ORAL_CAPSULE | Freq: Every day | ORAL | 3 refills | Status: DC

## 2021-09-17 NOTE — Assessment & Plan Note (Signed)
Unfortunately patient continues to have significant discomfort and pain.  Gabapentin does help but makes her significantly sleepy.  We discussed with patient about the possibility of changing Effexor to duloxetine and will start to titrate down to see if we can replace see if patient does have better results with this.  Discussed which activities to do and which ones to avoid.  Will increase activity slowly.  Follow-up with me again in 6 to 8 weeks.

## 2021-09-17 NOTE — Patient Instructions (Signed)
Good to see you  Effexor drop to 37.5 mg  Start cymbalta 20 mg daily and lets see if this helps differently.  Keep up with the strength training Wrap the wrist before working out  See me again in 7-8 weeks

## 2021-09-23 ENCOUNTER — Ambulatory Visit: Payer: No Typology Code available for payment source | Admitting: Family Medicine

## 2021-09-26 ENCOUNTER — Encounter: Payer: Self-pay | Admitting: Family Medicine

## 2021-10-05 ENCOUNTER — Encounter: Payer: Self-pay | Admitting: Family Medicine

## 2021-10-09 ENCOUNTER — Other Ambulatory Visit: Payer: Self-pay | Admitting: Family Medicine

## 2021-10-28 NOTE — Progress Notes (Signed)
[name] [address] [place] [address] Phone: [phone] Subjective:   [name], am serving as a scribe for Dr. Antoine Primas.  I'm seeing this patient by the request  of:  Sandford Craze, NP  CC: Neck and back pain follow-up  [id number]  Debbie [name] is a 36 y.o. female coming in with complaint of back and neck pain. OMT on 09/17/2021. Patient states that she had a few weeks in which she did not feel good due to trying Cymbalta and lowering Effexor. Stopped taking Cymbalta and her nausea and dizziness went away. Continues to use Effexor at 75mg  as her pain was worse with 37.5mg . Pain is back to baseline. Pain is the same as last visit.   Medications patient has been prescribed: Cymbalta Gabapentin Effexor  Taking:         Reviewed prior external information including notes and imaging from previsou exam, outside providers and external EMR if available.   As well as notes that were available from care everywhere and other healthcare systems.  Past medical history, social, surgical and family history all reviewed in electronic medical record.  No pertanent information unless stated regarding to the chief complaint.   Past Medical History:  Diagnosis Date   Abnormal heart rhythm    History of chicken pox    POTS (postural orthostatic tachycardia syndrome)     Allergies  Allergen Reactions   Gluten Meal      Review of Systems:  No headache, visual changes, nausea, vomiting, diarrhea, constipation, dizziness, abdominal pain, skin rash, fevers, chills, night sweats, weight loss, swollen lymph nodes, body aches, joint swelling, chest pain, shortness of breath, mood changes. POSITIVE muscle aches  Objective  Pulse 96, height 5\' 6"  (1.676 m), weight 123 lb (55.8 kg), SpO2 97 %.   General: No apparent distress alert and oriented x3 mood and affect normal, dressed appropriately.  HEENT: Pupils equal, extraocular  movements intact  Respiratory: Patient's speak in full sentences and does not appear short of breath  Cardiovascular: No lower extremity edema, non tender, no erythema  Gait MSK:  Back back exam does have some loss of lordosis.  Some tenderness to palpation in the paraspinal musculature.  Patient still has significant hypermobility noted.  Low back still has tightness noted around the sacroiliac joints bilaterally.  Osteopathic findings  C2 flexed rotated and side bent right C6 flexed rotated and side bent left T3 extended rotated and side bent right inhaled rib T9 extended rotated and side bent left T11 flexed rotated and side bent right L2 flexed rotated and side bent right Sacrum right on right       Assessment and Plan:  SI (sacroiliac) joint dysfunction Hypermobility noted.  Discussed medications for quite some time.  Seems to be doing better with the Effexor than the Cymbalta.  Attempted to try to decrease but unfortunately did not make a significant difference.  Discussed home exercises and icing regimen otherwise.  Follow-up with me again in 6 to 8 weeks.    Nonallopathic problems  Decision today to treat with OMT was based on Physical Exam  After verbal consent patient was treated with HVLA, ME, FPR techniques in cervical, rib, thoracic, lumbar, and sacral  areas  Patient tolerated the procedure well with improvement in symptoms  Patient given exercises, stretches and lifestyle modifications  See medications in patient instructions if given  Patient will follow up in 4-8 weeks     The above documentation  has been reviewed and is accurate and complete [name], DO         Note: This dictation was prepared with Dragon dictation along with smaller phrase technology. Any transcriptional errors that result from this process are unintentional.

## 2021-10-29 ENCOUNTER — Encounter: Payer: Self-pay | Admitting: Family Medicine

## 2021-10-29 ENCOUNTER — Ambulatory Visit: Payer: No Typology Code available for payment source | Admitting: Family Medicine

## 2021-10-29 VITALS — HR 96 | Ht 66.0 in | Wt 123.0 lb

## 2021-10-29 DIAGNOSIS — M9901 Segmental and somatic dysfunction of cervical region: Secondary | ICD-10-CM | POA: Diagnosis not present

## 2021-10-29 DIAGNOSIS — M533 Sacrococcygeal disorders, not elsewhere classified: Secondary | ICD-10-CM | POA: Diagnosis not present

## 2021-10-29 DIAGNOSIS — M9903 Segmental and somatic dysfunction of lumbar region: Secondary | ICD-10-CM

## 2021-10-29 DIAGNOSIS — M9904 Segmental and somatic dysfunction of sacral region: Secondary | ICD-10-CM

## 2021-10-29 DIAGNOSIS — M9908 Segmental and somatic dysfunction of rib cage: Secondary | ICD-10-CM

## 2021-10-29 DIAGNOSIS — M9902 Segmental and somatic dysfunction of thoracic region: Secondary | ICD-10-CM

## 2021-10-29 NOTE — Assessment & Plan Note (Signed)
Hypermobility noted.  Discussed medications for quite some time.  Seems to be doing better with the Effexor than the Cymbalta.  Attempted to try to decrease but unfortunately did not make a significant difference.  Discussed home exercises and icing regimen otherwise.  Follow-up with me again in 6 to 8 weeks.

## 2021-10-29 NOTE — Patient Instructions (Signed)
Good to see you! Have fun at the [employer] See you again in 7-8 weeks

## 2021-11-15 ENCOUNTER — Encounter: Payer: Self-pay | Admitting: Family Medicine

## 2021-11-16 ENCOUNTER — Other Ambulatory Visit: Payer: Self-pay

## 2021-11-16 MED ORDER — VENLAFAXINE HCL ER 150 MG PO CP24: 150.0000 mg | ORAL_CAPSULE | Freq: Every day | ORAL | 0 refills | Status: DC

## 2021-12-01 ENCOUNTER — Other Ambulatory Visit: Payer: Self-pay | Admitting: Family Medicine

## 2021-12-10 NOTE — Progress Notes (Unsigned)
  [name] [address] [place] [address] Phone: [phone] Subjective:   INadine Counts, am serving as a scribe for Dr. Antoine Primas.  I'm seeing this patient by the request  of:  Sandford Craze, NP  CC: low back pain follow up   [id number]  Debbie [name] is a 36 y.o. female coming in with complaint of back and neck pain. OMT on 10/29/2021. Patient states everything about the same. NO new issues.  Medications patient has been prescribed: Voltaren Gabapentin went up to 150 mg on effexor   Taking:         Reviewed prior external information including notes and imaging from previsou exam, outside providers and external EMR if available.   As well as notes that were available from care everywhere and other healthcare systems.  Past medical history, social, surgical and family history all reviewed in electronic medical record.  No pertanent information unless stated regarding to the chief complaint.   Past Medical History:  Diagnosis Date   Abnormal heart rhythm    History of chicken pox    POTS (postural orthostatic tachycardia syndrome)     Allergies  Allergen Reactions   Gluten Meal      Review of Systems:  No headache, visual changes, nausea, vomiting, diarrhea, constipation, dizziness, abdominal pain, skin rash, fevers, chills, night sweats, weight loss, swollen lymph nodes, body aches, joint swelling, chest pain, shortness of breath, mood changes. POSITIVE muscle aches  Objective  Blood pressure 110/70, pulse 94, height 5\' 6"  (1.676 m), weight 127 lb (57.6 kg), SpO2 98 %.   General: No apparent distress alert and oriented x3 mood and affect normal, dressed appropriately.  HEENT: Pupils equal, extraocular movements intact  Respiratory: Patient's speak in full sentences and does not appear short of breath  Cardiovascular: No lower extremity edema, non tender, no erythema  Gait MSK:  Back loss of lordosis  patient does have hypermobility of multiple joints.  Patient does have some tenderness to palpation in the paraspinal musculature.   Osteopathic findings  C2 flexed rotated and side bent right C5 flexed rotated and side bent left T3 extended rotated and side bent right inhaled rib T8 extended rotated and side bent left L2 flexed rotated and side bent right L5 flexed rotated and side bent left Sacrum right on right       Assessment and Plan:  SI (sacroiliac) joint dysfunction Sacroiliac dysfunction noted.  Discussed which activities to do and which ones to avoid.  Increase activity slowly.  Patient has made good progress at the moment.  Doing better with the higher dose of the Effexor.  Follow-up again in 6 weeks    Nonallopathic problems  Decision today to treat with OMT was based on Physical Exam  After verbal consent patient was treated with HVLA, ME, FPR techniques in cervical, rib, thoracic, lumbar, and sacral  areas  Patient tolerated the procedure well with improvement in symptoms  Patient given exercises, stretches and lifestyle modifications  See medications in patient instructions if given  Patient will follow up in 4-8 weeks     The above documentation has been reviewed and is accurate and complete , DO         Note: This dictation was prepared with Dragon dictation along with smaller phrase technology. Any transcriptional errors that result from this process are unintentional.

## 2021-12-13 ENCOUNTER — Encounter: Payer: Self-pay | Admitting: Family Medicine

## 2021-12-13 ENCOUNTER — Ambulatory Visit: Payer: No Typology Code available for payment source | Admitting: Family Medicine

## 2021-12-13 VITALS — BP 110/70 | HR 94 | Ht 66.0 in | Wt 127.0 lb

## 2021-12-13 DIAGNOSIS — M9904 Segmental and somatic dysfunction of sacral region: Secondary | ICD-10-CM | POA: Diagnosis not present

## 2021-12-13 DIAGNOSIS — M533 Sacrococcygeal disorders, not elsewhere classified: Secondary | ICD-10-CM

## 2021-12-13 DIAGNOSIS — M9901 Segmental and somatic dysfunction of cervical region: Secondary | ICD-10-CM

## 2021-12-13 DIAGNOSIS — M9902 Segmental and somatic dysfunction of thoracic region: Secondary | ICD-10-CM

## 2021-12-13 DIAGNOSIS — M9908 Segmental and somatic dysfunction of rib cage: Secondary | ICD-10-CM | POA: Diagnosis not present

## 2021-12-13 DIAGNOSIS — M9903 Segmental and somatic dysfunction of lumbar region: Secondary | ICD-10-CM

## 2021-12-13 NOTE — Patient Instructions (Addendum)
Good to see you! Glad you're doing well

## 2021-12-13 NOTE — Assessment & Plan Note (Signed)
Sacroiliac dysfunction noted.  Discussed which activities to do and which ones to avoid.  Increase activity slowly.  Patient has made good progress at the moment.  Doing better with the higher dose of the Effexor.  Follow-up again in 6 weeks

## 2021-12-28 ENCOUNTER — Other Ambulatory Visit: Payer: Self-pay | Admitting: Family Medicine

## 2022-01-12 NOTE — Progress Notes (Signed)
[place] Phone: [phone] Subjective:   [name], am serving as a scribe for Dr. Hulan Saas.  I'm seeing this patient by the request  of:  [name], NP  CC: Back and neck pain follow-up  [id number]  Debbie [name] is a 36 y.o. female coming in with complaint of back and neck pain. OMT 12/13/2021. Patient states that her back pain is not worse and might be slightly better. Continues to work on strength.  Medications patient has been prescribed: Effexor, Gabapentin  Taking:         Reviewed prior external information including notes and imaging from previsou exam, outside providers and external EMR if available.   As well as notes that were available from care everywhere and other healthcare systems.  Past medical history, social, surgical and family history all reviewed in electronic medical record.  No pertanent information unless stated regarding to the chief complaint.   Past Medical History:  Diagnosis Date   Abnormal heart rhythm    History of chicken pox    POTS (postural orthostatic tachycardia syndrome)     Allergies  Allergen Reactions   Gluten Meal      Review of Systems:  No headache, visual changes, nausea, vomiting, diarrhea, constipation, dizziness, abdominal pain, skin rash, fevers, chills, night sweats, weight loss, swollen lymph nodes, body aches, joint swelling, chest pain, shortness of breath, mood changes. POSITIVE muscle aches  Objective  Blood pressure 110/78, pulse 62, height 5\' 6"  (1.676 m), weight 125 lb (56.7 kg), SpO2 98 %.   General: No apparent distress alert and oriented x3 mood and affect normal, dressed appropriately.  HEENT: Pupils equal, extraocular movements intact  Respiratory: Patient's speak in full sentences and does not appear short of breath  Cardiovascular: No lower extremity edema, non tender, no erythema  Exam does show  the patient does have significant hypermobility still noted. Tenderness to palpation noted patient does have some low back tightness that is fairly significant  Osteopathic findings  C2 flexed rotated and side bent right C6 flexed rotated and side bent left T3 extended rotated and side bent right inhaled rib T9 extended rotated and side bent left L2 flexed rotated and side bent right Sacrum right on right       Assessment and Plan:  SI (sacroiliac) joint dysfunction Continue coccydynia and low back pain.  The patient continues to have some intermittent radicular symptoms.  Does have Zanaflex and 2 mg at night.  Continues to seem to be more stable with the higher dose of the Effexor at 150 mg daily as well.  Increase activity slowly otherwise.  Continue to be active and patient will start adding more strength exercises.  Follow-up again in 6 to 8 weeks    Nonallopathic problems  Decision today to treat with OMT was based on Physical Exam  After verbal consent patient was treated with HVLA, ME, FPR techniques in cervical, rib, thoracic, lumbar, and sacral  areas  Patient tolerated the procedure well with improvement in symptoms  Patient given exercises, stretches and lifestyle modifications  See medications in patient instructions if given  Patient will follow up in 4-8 weeks     The above documentation has been reviewed and is accurate and complete [name], DO         Note: This dictation was prepared with Dragon dictation along with smaller phrase technology. Any transcriptional errors that result from this  process are unintentional.

## 2022-01-13 ENCOUNTER — Ambulatory Visit: Payer: No Typology Code available for payment source | Admitting: Family Medicine

## 2022-01-13 ENCOUNTER — Encounter: Payer: Self-pay | Admitting: Family Medicine

## 2022-01-13 VITALS — BP 110/78 | HR 62 | Ht 66.0 in | Wt 125.0 lb

## 2022-01-13 DIAGNOSIS — M9904 Segmental and somatic dysfunction of sacral region: Secondary | ICD-10-CM | POA: Diagnosis not present

## 2022-01-13 DIAGNOSIS — M9903 Segmental and somatic dysfunction of lumbar region: Secondary | ICD-10-CM | POA: Diagnosis not present

## 2022-01-13 DIAGNOSIS — M9902 Segmental and somatic dysfunction of thoracic region: Secondary | ICD-10-CM

## 2022-01-13 DIAGNOSIS — M9908 Segmental and somatic dysfunction of rib cage: Secondary | ICD-10-CM

## 2022-01-13 DIAGNOSIS — M533 Sacrococcygeal disorders, not elsewhere classified: Secondary | ICD-10-CM

## 2022-01-13 DIAGNOSIS — M9901 Segmental and somatic dysfunction of cervical region: Secondary | ICD-10-CM

## 2022-01-13 NOTE — Patient Instructions (Signed)
Keep thinking of Halloween outfit Stay active See me in 6-7 weeks

## 2022-01-13 NOTE — Assessment & Plan Note (Signed)
Continue coccydynia and low back pain.  The patient continues to have some intermittent radicular symptoms.  Does have Zanaflex and 2 mg at night.  Continues to seem to be more stable with the higher dose of the Effexor at 150 mg daily as well.  Increase activity slowly otherwise.  Continue to be active and patient will start adding more strength exercises.  Follow-up again in 6 to 8 weeks

## 2022-01-18 ENCOUNTER — Ambulatory Visit: Payer: No Typology Code available for payment source | Admitting: Family Medicine

## 2022-03-15 NOTE — Progress Notes (Unsigned)
[name] [address] [place] [address] Phone: [phone] Subjective:   [name], am serving as a scribe for Dr. Antoine Primas.  I'm seeing this patient by the request  of:  Sandford Craze, NP  CC: Low back pain follow-up  [id number]  Zanna Cacho is a 36 y.o. female coming in with complaint of back and neck pain. OMT 01/13/2022. Patient states that she has been doing well. Has been active and has not had pain.  Has been doing relatively well.  Has added more of the muscle strengthening exercises and weightlifting but patient does think it is more improvement noted.  Medications patient has been prescribed: Effexor, Gabapentin, Diclofenac  Taking:         Reviewed prior external information including notes and imaging from previsou exam, outside providers and external EMR if available.   As well as notes that were available from care everywhere and other healthcare systems.  Past medical history, social, surgical and family history all reviewed in electronic medical record.  No pertanent information unless stated regarding to the chief complaint.   Past Medical History:  Diagnosis Date   Abnormal heart rhythm    History of chicken pox    POTS (postural orthostatic tachycardia syndrome)     Allergies  Allergen Reactions   Gluten Meal      Review of Systems:  No headache, visual changes, nausea, vomiting, diarrhea, constipation, dizziness, abdominal pain, skin rash, fevers, chills, night sweats, weight loss, swollen lymph nodes, body aches, joint swelling, chest pain, shortness of breath, mood changes. POSITIVE muscle aches  Objective  Blood pressure 110/80, pulse 80, height 5\' 6"  (1.676 m), weight 124 lb (56.2 kg), SpO2 98 %.   General: No apparent distress alert and oriented x3 mood and affect normal, dressed appropriately.  HEENT: Pupils equal, extraocular movements intact  Respiratory: Patient's  speak in full sentences and does not appear short of breath  Cardiovascular: No lower extremity edema, non tender, no erythema  Low back exam does have some loss of lordosis.  Some tightness noted in the paraspinal musculature minorly.  Seems to be more in the thoracolumbar juncture.  Does have hypermobility of other joints noted.  Osteopathic findings  C5 flexed rotated and side bent right T5 extended rotated and side bent right inhaled rib T9 extended rotated and side bent left L2 flexed rotated and side bent right Sacrum right on right     Assessment and Plan:  SI (sacroiliac) joint dysfunction Patient is doing significantly better working on her core strengthening.  Patient is doing very well with also the strength testing and strengthening as well.  Discussed icing regimen and home exercises, which activities to do and which ones to avoid.  Increase activity slowly.  Follow-up again in 6 to 8 weeks    Nonallopathic problems  Decision today to treat with OMT was based on Physical Exam  After verbal consent patient was treated with HVLA, ME, FPR techniques in cervical, rib, thoracic, lumbar, and sacral  areas  Patient tolerated the procedure well with improvement in symptoms  Patient given exercises, stretches and lifestyle modifications  See medications in patient instructions if given  Patient will follow up in 4-8 weeks     The above documentation has been reviewed and is accurate and complete , DO         Note: This dictation was prepared with Dragon dictation along with smaller phrase technology. Any transcriptional errors  that result from this process are unintentional.

## 2022-03-17 ENCOUNTER — Encounter: Payer: Self-pay | Admitting: Family Medicine

## 2022-03-17 ENCOUNTER — Ambulatory Visit: Payer: No Typology Code available for payment source | Admitting: Family Medicine

## 2022-03-17 VITALS — BP 110/80 | HR 80 | Ht 66.0 in | Wt 124.0 lb

## 2022-03-17 DIAGNOSIS — M9908 Segmental and somatic dysfunction of rib cage: Secondary | ICD-10-CM | POA: Diagnosis not present

## 2022-03-17 DIAGNOSIS — M9903 Segmental and somatic dysfunction of lumbar region: Secondary | ICD-10-CM | POA: Diagnosis not present

## 2022-03-17 DIAGNOSIS — M9902 Segmental and somatic dysfunction of thoracic region: Secondary | ICD-10-CM | POA: Diagnosis not present

## 2022-03-17 DIAGNOSIS — M9901 Segmental and somatic dysfunction of cervical region: Secondary | ICD-10-CM

## 2022-03-17 DIAGNOSIS — M9904 Segmental and somatic dysfunction of sacral region: Secondary | ICD-10-CM | POA: Diagnosis not present

## 2022-03-17 DIAGNOSIS — M533 Sacrococcygeal disorders, not elsewhere classified: Secondary | ICD-10-CM | POA: Diagnosis not present

## 2022-03-17 NOTE — Assessment & Plan Note (Signed)
Patient is doing significantly better working on her core strengthening.  Patient is doing very well with also the strength testing and strengthening as well.  Discussed icing regimen and home exercises, which activities to do and which ones to avoid.  Increase activity slowly.  Follow-up again in 6 to 8 weeks

## 2022-03-17 NOTE — Patient Instructions (Signed)
Good to see you  Keep it up  see me again in six weeks

## 2022-04-01 ENCOUNTER — Other Ambulatory Visit: Payer: Self-pay | Admitting: Family Medicine

## 2022-04-01 MED ORDER — GABAPENTIN 100 MG PO CAPS: 200.0000 mg | ORAL_CAPSULE | Freq: Every day | ORAL | 0 refills | Status: DC

## 2022-04-27 NOTE — Progress Notes (Unsigned)
[place] Phone: [phone] Subjective:   Debbie [name], am serving as a scribe for Dr. Hulan Saas.  I'm seeing this patient by the request  of:  Debbie Alar, NP  CC: back and neck pain   [id number]  Debbie [name] is a 37 y.o. female coming in with complaint of back and neck pain. OMT 03/17/2022. Patient states that her lower back feels a bit worse due to having to sit more frequently.   Medications patient has been prescribed: Effexor  Taking:         Reviewed prior external information including notes and imaging from previsou exam, outside providers and external EMR if available.   As well as notes that were available from care everywhere and other healthcare systems.  Past medical history, social, surgical and family history all reviewed in electronic medical record.  [name] pertanent information unless stated regarding to the chief complaint.   Past Medical History:  Diagnosis Date   Abnormal heart rhythm    History of chicken pox    POTS (postural orthostatic tachycardia syndrome)     Allergies  Allergen Reactions   Gluten Meal      Review of Systems:  [name] headache, visual changes, nausea, vomiting, diarrhea, constipation, dizziness, abdominal pain, skin rash, fevers, chills, night sweats, weight loss, swollen lymph nodes, body aches, joint swelling, chest pain, shortness of breath, mood changes. POSITIVE muscle aches  Objective  Blood pressure 114/82, pulse 97, height 5\' 6"  (1.676 m), weight 124 lb (56.2 kg), SpO2 99 %.   General: [name] apparent distress alert and oriented x3 mood and affect normal, dressed appropriately.  HEENT: Pupils equal, extraocular movements intact  Respiratory: Patient's speak in full sentences and does not appear short of breath  Cardiovascular: [name] lower extremity edema, non tender, [name] erythema  Hypermobility noted, continuing to have palpation  in the area.  Patient does have pain in the sacroiliac joints as well.   Osteopathic findings  C2 flexed rotated and side bent right C7 flexed rotated and side bent left T3 extended rotated and side bent right inhaled rib T8 extended rotated and side bent left L2 flexed rotated and side bent right Sacrum right on right       Assessment and Plan:  Coccydynia Worsening coccydynia.  The patient would like to consider the possibility of injection, discussed icing regimen and home exercises noted. Discussed avoiding certain activities.  Patient will come back again in 2 weeks otherwise.  Continue present medications including Effexor.  SI (sacroiliac) joint dysfunction Patient did have significant difficulty as well.  Discussed posture and ergonomics.  The patient is going to continue to do some more of the strength training that he needed range of motion exercises.  Discussed posture and ergonomics, increase activity slowly.  Follow-up again in 6 to 8 weeks    Nonallopathic problems  Decision today to treat with OMT was based on Physical Exam  After verbal consent patient was treated with HVLA, ME, FPR techniques in cervical, rib, thoracic, lumbar, and sacral  areas  Patient tolerated the procedure well with improvement in symptoms  Patient given exercises, stretches and lifestyle modifications  See medications in patient instructions if given  Patient will follow up in 4-8 weeks     The above documentation has been reviewed and is accurate and complete Debbie Pulley, DO         Note: This dictation was prepared with Viviann Spare  dictation along with smaller phrase technology. Any transcriptional errors that result from this process are unintentional.

## 2022-04-28 ENCOUNTER — Ambulatory Visit: Payer: No Typology Code available for payment source | Admitting: Family Medicine

## 2022-04-28 VITALS — BP 114/82 | HR 97 | Ht 66.0 in | Wt 124.0 lb

## 2022-04-28 DIAGNOSIS — M9903 Segmental and somatic dysfunction of lumbar region: Secondary | ICD-10-CM

## 2022-04-28 DIAGNOSIS — M9908 Segmental and somatic dysfunction of rib cage: Secondary | ICD-10-CM

## 2022-04-28 DIAGNOSIS — M9902 Segmental and somatic dysfunction of thoracic region: Secondary | ICD-10-CM | POA: Diagnosis not present

## 2022-04-28 DIAGNOSIS — M9901 Segmental and somatic dysfunction of cervical region: Secondary | ICD-10-CM

## 2022-04-28 DIAGNOSIS — M9904 Segmental and somatic dysfunction of sacral region: Secondary | ICD-10-CM

## 2022-04-28 DIAGNOSIS — M533 Sacrococcygeal disorders, not elsewhere classified: Secondary | ICD-10-CM

## 2022-04-28 NOTE — Assessment & Plan Note (Signed)
Patient did have significant difficulty as well.  Discussed posture and ergonomics.  The patient is going to continue to do some more of the strength training that he needed range of motion exercises.  Discussed posture and ergonomics, increase activity slowly.  Follow-up again in 6 to 8 weeks

## 2022-04-28 NOTE — Assessment & Plan Note (Signed)
Worsening coccydynia.  The patient would like to consider the possibility of injection, discussed icing regimen and home exercises noted. Discussed avoiding certain activities.  Patient will come back again in 2 weeks otherwise.  Continue present medications including Effexor.

## 2022-04-28 NOTE — Patient Instructions (Signed)
See you on Valentine's Day

## 2022-05-17 NOTE — Progress Notes (Unsigned)
Debbie [name] [address] [place] Phone: [phone] Subjective:   IVilma [name], am serving as a scribe for Dr. Hulan Saas.  I'm seeing this patient by the request  of:  [name], NP  CC: Back and neck pain follow-up  [id number]  Debbie [name] is a 37 y.o. female coming in with complaint of back and neck pain. OMT on 04/28/2022. Patient states here for coccyx injection and manipulation  Medications patient has been prescribed: Gabapentin  Taking:         Reviewed prior external information including notes and imaging from previsou exam, outside providers and external EMR if available.   As well as notes that were available from care everywhere and other healthcare systems.  Past medical history, social, surgical and family history all reviewed in electronic medical record.  No pertanent information unless stated regarding to the chief complaint.   Past Medical History:  Diagnosis Date   Abnormal heart rhythm    History of chicken pox    POTS (postural orthostatic tachycardia syndrome)     Allergies  Allergen Reactions   Gluten Meal      Review of Systems:  No headache, visual changes, nausea, vomiting, diarrhea, constipation, dizziness, abdominal pain, skin rash, fevers, chills, night sweats, weight loss, swollen lymph nodes, body aches, joint swelling, chest pain, shortness of breath, mood changes. POSITIVE muscle aches  Objective  Blood pressure 116/86, pulse 97, height 5' 6"$  (1.676 m), weight 127 lb (57.6 kg), SpO2 97 %.   General: No apparent distress alert and oriented x3 mood and affect normal, dressed appropriately.  HEENT: Pupils equal, extraocular movements intact  Respiratory: Patient's speak in full sentences and does not appear short of breath  Cardiovascular: No lower extremity edema, non tender, no erythema  Back exam does have some loss of lordosis noted.  Some tenderness to  palpation in the paraspinal musculature.  Continued pain over the coccyx noted that is still severe.  Procedure: Real-time Ultrasound Guided Injection of coccyx plexus right side Device: GE Logiq Q7 Ultrasound guided injection is preferred based studies that show increased duration, increased effect, greater accuracy, decreased procedural pain, increased response rate, and decreased cost with ultrasound guided versus blind injection.  Verbal informed consent obtained.  Time-out conducted.  Noted no overlying erythema, induration, or other signs of local infection.  Skin prepped in a sterile fashion.  Local anesthesia: Topical Ethyl chloride.  With sterile technique and under real time ultrasound guidance: With a 21-gauge 1-1/2 inch needle patient was injected with 0.5 cc of 0.5% Marcaine and 0.5 cc of Kenalog 40 mg/mL. Completed without difficulty  Pain immediately resolved suggesting accurate placement of the medication.  Advised to call if fevers/chills, erythema, induration, drainage, or persistent bleeding.  Impression: Technically successful ultrasound guided injection.  Osteopathic findings  C2 flexed rotated and side bent right C6 flexed rotated and side bent left T3 extended rotated and side bent right inhaled rib T9 extended rotated and side bent left L2 flexed rotated and side bent right Sacrum right on right       Assessment and Plan:  Coccydynia Chronic problem with worsening symptoms.  After giving suggestions patient decided to do the injection again today.  Repeat injection given today, tolerated the procedure extremely well.  Discussed icing regimen and home exercises, discussed watching for any type of signs of any type of bleeding aspect.  Discussed icing regimen and home exercises otherwise.  Patient knows well and  will follow-up with me again in 6 to 8 weeks otherwise.  SI (sacroiliac) joint dysfunction Patient also has sacroiliac dysfunction and is is hard to  know which 1 is contributing to more of the discomfort and pain.  Discussed with patient about icing regimen and home exercises, discussed which activities to do and which ones to avoid.  Patient does respond relatively well to osteopathic manipulation.  Follow-up with me again 6 to 8 weeks    Nonallopathic problems  Decision today to treat with OMT was based on Physical Exam  After verbal consent patient was treated with HVLA, ME, FPR techniques in cervical, rib, thoracic, lumbar, and sacral  areas  Patient tolerated the procedure well with improvement in symptoms  Patient given exercises, stretches and lifestyle modifications  See medications in patient instructions if given  Patient will follow up in 4-8 weeks     The above documentation has been reviewed and is accurate and complete [name], DO         Note: This dictation was prepared with Dragon dictation along with smaller phrase technology. Any transcriptional errors that result from this process are unintentional.

## 2022-05-18 ENCOUNTER — Ambulatory Visit: Payer: No Typology Code available for payment source | Admitting: Family Medicine

## 2022-05-18 ENCOUNTER — Encounter: Payer: Self-pay | Admitting: Family Medicine

## 2022-05-18 ENCOUNTER — Ambulatory Visit: Payer: Self-pay

## 2022-05-18 VITALS — BP 116/86 | HR 97 | Ht 66.0 in | Wt 127.0 lb

## 2022-05-18 DIAGNOSIS — M9904 Segmental and somatic dysfunction of sacral region: Secondary | ICD-10-CM | POA: Diagnosis not present

## 2022-05-18 DIAGNOSIS — M9902 Segmental and somatic dysfunction of thoracic region: Secondary | ICD-10-CM

## 2022-05-18 DIAGNOSIS — M9903 Segmental and somatic dysfunction of lumbar region: Secondary | ICD-10-CM

## 2022-05-18 DIAGNOSIS — M9901 Segmental and somatic dysfunction of cervical region: Secondary | ICD-10-CM | POA: Diagnosis not present

## 2022-05-18 DIAGNOSIS — M9908 Segmental and somatic dysfunction of rib cage: Secondary | ICD-10-CM

## 2022-05-18 DIAGNOSIS — M533 Sacrococcygeal disorders, not elsewhere classified: Secondary | ICD-10-CM | POA: Diagnosis not present

## 2022-05-18 NOTE — Assessment & Plan Note (Signed)
Patient also has sacroiliac dysfunction and is is hard to know which 1 is contributing to more of the discomfort and pain.  Discussed with patient about icing regimen and home exercises, discussed which activities to do and which ones to avoid.  Patient does respond relatively well to osteopathic manipulation.  Follow-up with me again 6 to 8 weeks

## 2022-05-18 NOTE — Patient Instructions (Addendum)
Injection in coccyx See you again in 6 weeks

## 2022-05-18 NOTE — Assessment & Plan Note (Signed)
Chronic problem with worsening symptoms.  After giving suggestions patient decided to do the injection again today.  Repeat injection given today, tolerated the procedure extremely well.  Discussed icing regimen and home exercises, discussed watching for any type of signs of any type of bleeding aspect.  Discussed icing regimen and home exercises otherwise.  Patient knows well and will follow-up with me again in 6 to 8 weeks otherwise.

## 2022-07-02 ENCOUNTER — Other Ambulatory Visit: Payer: Self-pay

## 2022-07-02 ENCOUNTER — Emergency Department (HOSPITAL_COMMUNITY)
Admission: EM | Admit: 2022-07-02 | Discharge: 2022-07-02 | Disposition: A | Discharge status: Home / Self Care | Payer: No Typology Code available for payment source | Attending: Emergency Medicine | Admitting: Emergency Medicine

## 2022-07-02 ENCOUNTER — Emergency Department (HOSPITAL_COMMUNITY): Payer: No Typology Code available for payment source

## 2022-07-02 DIAGNOSIS — R002 Palpitations: Secondary | ICD-10-CM | POA: Diagnosis present

## 2022-07-02 LAB — BASIC METABOLIC PANEL
Anion gap: 10 (ref 5–15)
BUN: 12 mg/dL (ref 6–20)
CO2: 24 mmol/L (ref 22–32)
Calcium: 9.4 mg/dL (ref 8.9–10.3)
Chloride: 104 mmol/L (ref 98–111)
Creatinine, Ser: 0.62 mg/dL (ref 0.44–1.00)
GFR, Estimated: >60 mL/min (ref >60)
Glucose, Bld: 93 mg/dL (ref 70–99)
Potassium: 3.8 mmol/L (ref 3.5–5.1)
Sodium: 138 mmol/L (ref 135–145)

## 2022-07-02 LAB — CBC
HCT: 44.1 % (ref 36.0–46.0)
Hemoglobin: 14.9 g/dL (ref 12.0–15.0)
MCH: 29.8 pg (ref 26.0–34.0)
MCHC: 33.8 g/dL (ref 30.0–36.0)
MCV: 88.2 fL (ref 80.0–100.0)
Platelets: 266 10*3/uL (ref 150–400)
RBC: 5 MIL/uL (ref 3.87–5.11)
RDW: 12.6 % (ref 11.5–15.5)
WBC: 5.5 10*3/uL (ref 4.0–10.5)
nRBC: 0 % (ref 0.0–0.2)

## 2022-07-02 LAB — URINALYSIS, ROUTINE W REFLEX MICROSCOPIC
Bilirubin Urine: NEGATIVE
Glucose, UA: NEGATIVE mg/dL
Hgb urine dipstick: NEGATIVE
Ketones, ur: NEGATIVE mg/dL
Leukocytes,Ua: NEGATIVE
Nitrite: NEGATIVE
Protein, ur: NEGATIVE mg/dL
Specific Gravity, Urine: 1.003 — ABNORMAL LOW (ref 1.005–1.030)
pH: 7 (ref 5.0–8.0)

## 2022-07-02 LAB — TROPONIN I (HIGH SENSITIVITY)
Troponin I (High Sensitivity): 4 ng/L (ref <18)
Troponin I (High Sensitivity): 4 ng/L (ref <18)

## 2022-07-02 LAB — PREGNANCY, URINE: Preg Test, Ur: NEGATIVE

## 2022-07-02 NOTE — ED Provider Notes (Signed)
[place] EMERGENCY DEPARTMENT AT [facility] Provider Note   CSN: [id number] Arrival date & time: 07/02/22  1225     History {Add pertinent medical, surgical, social history, OB history to HPI:1} Chief Complaint  Patient presents with   Palpitations    Debbie [name] is a 37 y.o. female.  HPI     Home Medications Prior to Admission medications   Medication Sig Start Date End Date Taking? Authorizing Provider  gabapentin (NEURONTIN) 100 MG capsule Take 2 capsules (200 mg total) by mouth at bedtime. 04/01/22   [name], DO  meloxicam (MOBIC) 15 MG tablet TAKE 1 TABLET (15 MG TOTAL) BY MOUTH DAILY. 05/19/21   [name], DO  tiZANidine (ZANAFLEX) 2 MG tablet TAKE 1 TABLET BY MOUTH EVERYDAY AT BEDTIME 06/27/21   [name], DO  venlafaxine XR (EFFEXOR-XR) 150 MG 24 hr capsule TAKE 1 CAPSULE BY MOUTH DAILY WITH BREAKFAST. 12/28/21   [name], DO      Allergies    Gluten meal    Review of Systems   Review of Systems  Physical Exam Updated Vital Signs BP 116/75   Pulse (!) 58   Temp 98.1 F (36.7 C) (Oral)   Resp 14   Ht 5\' 6"  (1.676 m)   Wt 56.7 kg   LMP 06/17/2022 (Exact Date)   SpO2 98%   BMI 20.18 kg/m  Physical Exam  ED Results / Procedures / Treatments   Labs (all labs ordered are listed, but only abnormal results are displayed) Labs Reviewed  URINALYSIS, ROUTINE W REFLEX MICROSCOPIC - Abnormal; Notable for the following components:      Result Value   Color, Urine STRAW (*)    Specific Gravity, Urine 1.003 (*)    All other components within normal limits  BASIC METABOLIC PANEL  CBC  PREGNANCY, URINE  TROPONIN I (HIGH SENSITIVITY)    EKG None  Radiology DG Chest 2 View  Result Date: 07/02/2022 CLINICAL DATA:  Palpitations for 2 days with shortness of breath EXAM: CHEST - 2 VIEW COMPARISON:  10/26/2020 FINDINGS: A mild pectus excavatum deformity. Multiple wires and leads project over the chest on the  frontal radiograph. Midline trachea. Normal heart size and mediastinal contours. Patient rotated minimally left. No pleural effusion or pneumothorax. Clear lungs. IMPRESSION: No active cardiopulmonary disease. Electronically Signed   By: Abigail Miyamoto M.D.   On: 07/02/2022 14:30    Procedures Procedures   Orthostatic Lying BP- Lying: 130/77 Pulse- Lying: 65 Orthostatic Sitting BP- Sitting: 135/78 Pulse- Sitting: 69 Orthostatic Standing at 0 minutes BP- Standing at 0 minutes: 128/90 Pulse- Standing at 0 minutes: 78 Orthostatic Standing at 3 minutes BP- Standing at 3 minutes: 130/74 Pulse- Standing at 3 minutes: 84    Medications Ordered in ED Medications - No data to display  ED Course/ Medical Decision Making/ A&P   {   Click here for ABCD2, HEART and other calculatorsREFRESH Note before signing :1}                          Medical Decision Making Amount and/or Complexity of Data Reviewed Labs: ordered. Radiology: ordered.   ***  {Document critical care time when appropriate:1} {Document review of labs and clinical decision tools ie heart score, Chads2Vasc2 etc:1}  {Document your independent review of radiology images, and any outside records:1} {Document your discussion with family members, caretakers, and with consultants:1} {Document social determinants of health affecting pt's  care:1} {Document your decision making why or why not admission, treatments were needed:1} Final Clinical Impression(s) / ED Diagnoses Final diagnoses:  None    Rx / DC Orders ED Discharge Orders     None

## 2022-07-02 NOTE — Discharge Instructions (Signed)
Thank you for letting us take care of you today.  You had a reassuring, negative workup including blood work, heart enzymes, EKG, chest x-ray, orthostatic vital signs, and physical evaluation. Your vital signs look good today as well. Unfortunately, we did not find a cause of your heart palpitations. With this, I want you to follow up closely with cardiology. I have referred you to a local cardiology clinic. They should contact you within 72 hours to schedule a follow up appointment. If you do not hear from them, please call their office to schedule this appointment. Cardiology may want to do further testing such as a Holter monitor, stress test, or other labs or imaging to look at your heart and make sure you are not having an arrhythmia or other heart complication.  If you develop any new or worsening symptoms such as severe chest pain, shortness of breath, loss of consciousness, prolonged fast heart rate, weakness on one side of your body, or other new, concerning symptoms, please return to nearest emergency department for re-evaluation.

## 2022-07-02 NOTE — ED Triage Notes (Signed)
Patient reports she has had heart palpitations since Tuesday, they have stopped during the day But since yesterday it has been continuous. Patient says she had POTS 10 years ago Does not currently take medications Denies pain in triage

## 2022-07-06 NOTE — Progress Notes (Unsigned)
[place] Debbie [name] Phone: [phone] Subjective:   Debbie Debbie [name], am serving as a scribe for Dr. Hulan Saas.  I'm seeing this patient by the request  of:  Debbie Alar, NP  CC: Back and neck pain  [id number]  Debbie Debbie [name] is a 37 y.o. female coming in with complaint of back and neck pain. OMT 05/18/2022. Coccyx injection last visit. Patient states that she is the same as last visit. May have had some relief from injection initially.   Medications patient has been prescribed: Gabapentin prn, Meloxicam prn         Reviewed prior external information including notes and imaging from previsou exam, outside providers and external EMR if available.   As well as notes that were available from care everywhere and other healthcare systems.  Past medical history, social, surgical and family history all reviewed in electronic medical record.  Debbie [name] pertanent information unless stated regarding to the chief complaint.   Past Medical History:  Diagnosis Date   Abnormal heart rhythm    History of chicken pox    POTS (postural orthostatic tachycardia syndrome)     Allergies  Allergen Reactions   Gluten Meal      Review of Systems:  Debbie [name] headache, visual changes, nausea, vomiting, diarrhea, constipation, dizziness, abdominal pain, skin rash, fevers, chills, night sweats, weight loss, swollen lymph nodes, body aches, joint swelling, chest pain, shortness of breath, mood changes. POSITIVE muscle aches  Objective  Blood pressure 110/72, pulse 64, height 5\' 6"  (1.676 m), weight 128 lb (58.1 kg), last menstrual period 06/17/2022, SpO2 98 %.   General: Debbie [name] apparent distress alert and oriented x3 mood and affect normal, dressed appropriately.  HEENT: Pupils equal, extraocular movements intact  Respiratory: Patient's speak in full sentences and does not appear short of breath  Cardiovascular: Debbie [name] lower extremity  edema, non tender, Debbie [name] erythema  Low back exam does have some loss lordosis noted.  Patient does have some tenderness to palpation more in the left sacroiliac joint today.  Osteopathic findings  C6 flexed rotated and side bent left T3 extended rotated and side bent right inhaled rib T9 extended rotated and side bent left L1 flexed rotated and side bent left L3 flexed rotated and side bent right Sacrum left on left       Assessment and Plan:  Coccydynia Patient had very mild improvement with the injection previously.  Patient has not had to have any prednisone as of recently.  This does make me hopeful that patient may be able to continue to increase activity.  We will watch patient closely and see how patient responds to conservative therapy otherwise.  Follow-up with me again in 6 to 8 weeks.    Nonallopathic problems  Decision today to treat with OMT was based on Physical Exam  After verbal consent patient was treated with HVLA, ME, FPR techniques in cervical, rib, thoracic, lumbar, and sacral  areas  Patient tolerated the procedure well with improvement in symptoms  Patient given exercises, stretches and lifestyle modifications  See medications in patient instructions if given  Patient will follow up in 4-8 weeks     The above documentation has been reviewed and is accurate and complete [name], DO         Note: This dictation was prepared with Dragon dictation along with smaller phrase technology. Any transcriptional errors that result from this process are unintentional.

## 2022-07-07 ENCOUNTER — Encounter: Payer: Self-pay | Admitting: Family Medicine

## 2022-07-07 ENCOUNTER — Ambulatory Visit: Payer: No Typology Code available for payment source | Admitting: Family Medicine

## 2022-07-07 VITALS — BP 110/72 | HR 64 | Ht 66.0 in | Wt 128.0 lb

## 2022-07-07 DIAGNOSIS — M9901 Segmental and somatic dysfunction of cervical region: Secondary | ICD-10-CM | POA: Diagnosis not present

## 2022-07-07 DIAGNOSIS — M9904 Segmental and somatic dysfunction of sacral region: Secondary | ICD-10-CM | POA: Diagnosis not present

## 2022-07-07 DIAGNOSIS — M9903 Segmental and somatic dysfunction of lumbar region: Secondary | ICD-10-CM | POA: Diagnosis not present

## 2022-07-07 DIAGNOSIS — M533 Sacrococcygeal disorders, not elsewhere classified: Secondary | ICD-10-CM

## 2022-07-07 DIAGNOSIS — M9908 Segmental and somatic dysfunction of rib cage: Secondary | ICD-10-CM

## 2022-07-07 DIAGNOSIS — M9902 Segmental and somatic dysfunction of thoracic region: Secondary | ICD-10-CM

## 2022-07-07 NOTE — Assessment & Plan Note (Signed)
Patient had very mild improvement with the injection previously.  Patient has not had to have any prednisone as of recently.  This does make me hopeful that patient may be able to continue to increase activity.  We will watch patient closely and see how patient responds to conservative therapy otherwise.  Follow-up with me again in 6 to 8 weeks.

## 2022-07-07 NOTE — Patient Instructions (Signed)
Good to see you Hope everything improves See me in 6-8 weeks

## 2022-07-08 ENCOUNTER — Other Ambulatory Visit: Payer: Self-pay | Admitting: Family Medicine

## 2022-08-17 NOTE — Progress Notes (Unsigned)
[name] [address] [place] [address] Phone: [phone] Subjective:   Debbie [name], am serving as a scribe for Dr. Antoine Primas.  I'm seeing this patient by the request  of:  Sandford Craze, NP  CC: Low back pain follow-up  [id number]  Debbie [name] is a 37 y.o. female coming in with complaint of back and neck pain. OMT 07/07/2022. Patient states same per usual. Sat for a long time and pain has flared. Took meloxicam for 3 days and it helped a little. Still aggravated today. No new concerns.  Medications patient has been prescribed: Effexor  Taking:         Reviewed prior external information including notes and imaging from previsou exam, outside providers and external EMR if available.   As well as notes that were available from care everywhere and other healthcare systems.  Past medical history, social, surgical and family history all reviewed in electronic medical record.  No pertanent information unless stated regarding to the chief complaint.   Past Medical History:  Diagnosis Date   Abnormal heart rhythm    History of chicken pox    POTS (postural orthostatic tachycardia syndrome)     Allergies  Allergen Reactions   Gluten Meal      Review of Systems:  No headache, visual changes, nausea, vomiting, diarrhea, constipation, dizziness, abdominal pain, skin rash, fevers, chills, night sweats, weight loss, swollen lymph nodes, body aches, joint swelling, chest pain, shortness of breath, mood changes. POSITIVE muscle aches  Objective  Blood pressure 102/76, pulse 88, height 5\' 6"  (1.676 m), weight 128 lb (58.1 kg), SpO2 98 %.   General: No apparent distress alert and oriented x3 mood and affect normal, dressed appropriately.  HEENT: Pupils equal, extraocular movements intact  Respiratory: Patient's speak in full sentences and does not appear short of breath  Cardiovascular: No lower extremity edema, non  tender, no erythema  Low back does have some loss of lordosis noted.  Patient does have hypermobility with flexion and extension noted.  Tenderness to palpation over the coccyx area still.  Tightness in the right groin area and adductor.  Osteopathic findings  C2 flexed rotated and side bent right C6 flexed rotated and side bent left T3 extended rotated and side bent right inhaled rib T9 extended rotated and side bent left L2 flexed rotated and side bent right Sacrum right on right       Assessment and Plan:  Coccydynia Continues to have difficulty with coccydynia.  Discussed with patient about the possibility of the PRP injections.  Could be potentially beneficial.  Discussed with patient there is not anything extremely specific found on previous imaging.  Patient would like to consider this though.  Will follow-up with me again in 6 to 8 weeks otherwise.    Nonallopathic problems  Decision today to treat with OMT was based on Physical Exam  After verbal consent patient was treated with HVLA, ME, FPR techniques in cervical, rib, thoracic, lumbar, and sacral  areas  Patient tolerated the procedure well with improvement in symptoms  Patient given exercises, stretches and lifestyle modifications  See medications in patient instructions if given  Patient will follow up in 4-8 weeks     The above documentation has been reviewed and is accurate and complete Judi Saa, DO         Note: This dictation was prepared with Dragon dictation along with smaller phrase technology. Any transcriptional errors that result from  this process are unintentional.

## 2022-08-18 ENCOUNTER — Ambulatory Visit: Payer: No Typology Code available for payment source | Admitting: Family Medicine

## 2022-08-18 ENCOUNTER — Encounter: Payer: Self-pay | Admitting: Family Medicine

## 2022-08-18 VITALS — BP 102/76 | HR 88 | Ht 66.0 in | Wt 128.0 lb

## 2022-08-18 DIAGNOSIS — M9904 Segmental and somatic dysfunction of sacral region: Secondary | ICD-10-CM | POA: Diagnosis not present

## 2022-08-18 DIAGNOSIS — M7661 Achilles tendinitis, right leg: Secondary | ICD-10-CM

## 2022-08-18 DIAGNOSIS — M9908 Segmental and somatic dysfunction of rib cage: Secondary | ICD-10-CM

## 2022-08-18 DIAGNOSIS — M9902 Segmental and somatic dysfunction of thoracic region: Secondary | ICD-10-CM

## 2022-08-18 DIAGNOSIS — M533 Sacrococcygeal disorders, not elsewhere classified: Secondary | ICD-10-CM

## 2022-08-18 DIAGNOSIS — M9903 Segmental and somatic dysfunction of lumbar region: Secondary | ICD-10-CM

## 2022-08-18 DIAGNOSIS — M9901 Segmental and somatic dysfunction of cervical region: Secondary | ICD-10-CM

## 2022-08-18 NOTE — Assessment & Plan Note (Signed)
Patient does have right Achilles tendinitis happening.  We discussed a pneumatic heel brace, home exercises, topical anti-inflammatories.  Discussed potential recovery sandals.  Follow-up with me again in 6 to 8 weeks otherwise.

## 2022-08-18 NOTE — Patient Instructions (Signed)
PRP handout Can do that in June  Achilles exercises  Pneumatic heel lift See me again in 6 weeks for OMT

## 2022-08-18 NOTE — Assessment & Plan Note (Signed)
Continues to have difficulty with coccydynia.  Discussed with patient about the possibility of the PRP injections.  Could be potentially beneficial.  Discussed with patient there is not anything extremely specific found on previous imaging.  Patient would like to consider this though.  Will follow-up with me again in 6 to 8 weeks otherwise.

## 2022-08-25 ENCOUNTER — Encounter: Payer: Self-pay | Admitting: Family Medicine

## 2022-08-25 ENCOUNTER — Ambulatory Visit (INDEPENDENT_AMBULATORY_CARE_PROVIDER_SITE_OTHER): Payer: No Typology Code available for payment source | Admitting: Family Medicine

## 2022-08-25 VITALS — BP 132/84 | HR 74 | Ht 66.0 in | Wt 127.0 lb

## 2022-08-25 DIAGNOSIS — S40812A Abrasion of left upper arm, initial encounter: Secondary | ICD-10-CM | POA: Diagnosis not present

## 2022-08-25 NOTE — Progress Notes (Signed)
   Acute Office Visit  Subjective:     Patient ID: Debbie [name], female    DOB: 12/13/1985, 37 y.o.   MRN: [id number]  Chief Complaint  Patient presents with   Skin Problem    HPI Patient is in today for abrasion.   She reports that while she was exercising on Monday she accidentally scratched a mole (stable since childhood) on her left upper arm and it started bleeding. There has been some redness around it, but seems like it may be looking a little bit better. Area is now scabbed over. She denies any pain, spreading erythema, warmth, drainage, edema or induration.   ROS All review of systems negative except what is listed in the HPI      Objective:    BP 132/84   Pulse 74   Ht 5\' 6"  (1.676 m)   Wt 127 lb (57.6 kg)   SpO2 100%   BMI 20.50 kg/m    Physical Exam Vitals reviewed.  Constitutional:      General: She is not in acute distress.    Appearance: Normal appearance. She is not ill-appearing.  Skin:    Comments: Small lesion to left upper arm with minimal erythema and scab. No spread of erythema, streaking, warmth, induration, fluctuance, edema, drainage  Neurological:     General: No focal deficit present.     Mental Status: She is alert and oriented to person, place, and time. Mental status is at baseline.  Psychiatric:        Mood and Affect: Mood normal.        Behavior: Behavior normal.        Thought Content: Thought content normal.        Judgment: Judgment normal.     No results found for any visits on 08/25/22.      Assessment & Plan:   Problem List Items Addressed This Visit   None Visit Diagnoses     Abrasion of skin of left upper arm    -  Primary Area is scabbed and appears to be healing nicely. No signs of worsening infection. Advised she continue keeping it clean, applying topical antibiotic ointment, and cover with bandaid until resolved. Patient aware of signs/symptoms requiring further/urgent evaluation.         No  orders of the defined types were placed in this encounter.   Return if symptoms worsen or fail to improve.  Clayborne Dana, NP

## 2022-09-20 ENCOUNTER — Other Ambulatory Visit: Payer: Self-pay

## 2022-09-20 ENCOUNTER — Other Ambulatory Visit: Payer: Self-pay | Admitting: Family Medicine

## 2022-09-20 MED ORDER — GABAPENTIN 100 MG PO CAPS: 200.0000 mg | ORAL_CAPSULE | Freq: Every day | ORAL | 0 refills | Status: DC

## 2022-09-20 MED ORDER — MELOXICAM 15 MG PO TABS: 15.0000 mg | ORAL_TABLET | Freq: Every day | ORAL | 0 refills | Status: DC

## 2022-09-28 NOTE — Progress Notes (Unsigned)
[name] [address] [place] [address] Phone: [phone] Subjective:   Debbie [name], am serving as a scribe for Dr. Antoine Primas.  I'm seeing this patient by the request  of:  Sandford Craze, NP  CC: back and neck pain   [id number]  Debbie [name] is a 37 y.o. female coming in with complaint of back and neck pain. OMT 08/18/2022. Patient states same as usual. Wants to prp again. No new concerns.  Will continue to give her difficulty.  Still states that sitting seems to be more difficult.  Has had to use the meloxicam on a more regular basis recently.  Medications patient has been prescribed: Effexor, Gabapentin, Meloxicam  Taking:         Reviewed prior external information including notes and imaging from previsou exam, outside providers and external EMR if available.   As well as notes that were available from care everywhere and other healthcare systems.  Past medical history, social, surgical and family history all reviewed in electronic medical record.  No pertanent information unless stated regarding to the chief complaint.   Past Medical History:  Diagnosis Date   Abnormal heart rhythm    History of chicken pox    POTS (postural orthostatic tachycardia syndrome)     Allergies  Allergen Reactions   Gluten Meal      Review of Systems:  No headache, visual changes, nausea, vomiting, diarrhea, constipation, dizziness, abdominal pain, skin rash, fevers, chills, night sweats, weight loss, swollen lymph nodes, body aches, joint swelling, chest pain, shortness of breath, mood changes. POSITIVE muscle aches  Objective  Blood pressure 106/70, pulse 85, height 5\' 6"  (1.676 m), weight 129 lb (58.5 kg), SpO2 98 %.   General: No apparent distress alert and oriented x3 mood and affect normal, dressed appropriately.  HEENT: Pupils equal, extraocular movements intact  Respiratory: Patient's speak in full sentences  and does not appear short of breath  Cardiovascular: No lower extremity edema, non tender, no erythema low back exam does have some loss lordosis noted.  Some tenderness to palpation in the paraspinal musculature.  Patient does have tightness noted in the pelvic girdle.  Does have hypermobility another area as well.  Osteopathic findings  C2 flexed rotated and side bent right C2flexed rotated and side bent left T3 extended rotated and side bent right inhaled rib T9 extended rotated and side bent left L2 flexed rotated and side bent right Sacrum right on right Significant pelvic tilt noted.  Severe tenderness over the coccyx.      Assessment and Plan:  Coccydynia Continues to have difficulty.  Will consider the possibility of PRP in this area.  Patient has failed all other treatment options at this time.  Still does not want any surgical intervention.  Patient will think about this but come back again in 6 weeks.  If we are doing this we will also set her up the possibility of doing a PRP chronic right wrist. Attempted MRI with some mild manipulation of the coccyx area of the inferior aspect.  Patient felt comfortable with the door open but nobody else in the room but athletic trainers were available  Nonallopathic problems  Decision today to treat with OMT was based on Physical Exam  After verbal consent patient was treated with HVLA, ME, FPR techniques in cervical, rib, thoracic, lumbar, and sacral and pelvis areas  Patient tolerated the procedure well with improvement in symptoms  Patient given exercises, stretches and  lifestyle modifications  See medications in patient instructions if given  Patient will follow up in 4-8 weeks    The above documentation has been reviewed and is accurate and complete Judi Saa, DO          Note: This dictation was prepared with Dragon dictation along with smaller phrase technology. Any transcriptional errors that result from this  process are unintentional.

## 2022-09-29 ENCOUNTER — Ambulatory Visit: Payer: No Typology Code available for payment source | Admitting: Family Medicine

## 2022-09-29 VITALS — BP 106/70 | HR 85 | Ht 66.0 in | Wt 129.0 lb

## 2022-09-29 DIAGNOSIS — M533 Sacrococcygeal disorders, not elsewhere classified: Secondary | ICD-10-CM | POA: Diagnosis not present

## 2022-09-29 DIAGNOSIS — M9901 Segmental and somatic dysfunction of cervical region: Secondary | ICD-10-CM | POA: Diagnosis not present

## 2022-09-29 DIAGNOSIS — M9902 Segmental and somatic dysfunction of thoracic region: Secondary | ICD-10-CM

## 2022-09-29 DIAGNOSIS — M9903 Segmental and somatic dysfunction of lumbar region: Secondary | ICD-10-CM

## 2022-09-29 DIAGNOSIS — M9908 Segmental and somatic dysfunction of rib cage: Secondary | ICD-10-CM

## 2022-09-29 DIAGNOSIS — M9904 Segmental and somatic dysfunction of sacral region: Secondary | ICD-10-CM | POA: Diagnosis not present

## 2022-09-29 DIAGNOSIS — M9905 Segmental and somatic dysfunction of pelvic region: Secondary | ICD-10-CM

## 2022-09-29 NOTE — Assessment & Plan Note (Signed)
Continues to have difficulty.  Will consider the possibility of PRP in this area.  Patient has failed all other treatment options at this time.  Still does not want any surgical intervention.  Patient will think about this but come back again in 6 weeks.  If we are doing this we will also set her up the possibility of doing a PRP chronic right wrist.

## 2022-09-29 NOTE — Patient Instructions (Addendum)
See you again in 6 weeks For manipulation followed by PRP

## 2022-10-10 ENCOUNTER — Other Ambulatory Visit: Payer: Self-pay | Admitting: Family Medicine

## 2022-10-10 NOTE — Telephone Encounter (Signed)
Sent patient message to confirm use and dose.

## 2022-10-12 ENCOUNTER — Encounter: Payer: Self-pay | Admitting: Family Medicine

## 2022-10-13 MED ORDER — PREDNISONE 20 MG PO TABS: 40.0000 mg | ORAL_TABLET | Freq: Every day | ORAL | 0 refills | Status: DC

## 2022-11-02 NOTE — Progress Notes (Unsigned)
[name] [address] [place] [address] Phone: [phone] Subjective:   [name], am serving as a scribe for Dr. Antoine Primas.  I'm seeing this patient by the request  of:  Sandford Craze, NP  CC: PRP of the wrist and the coccyx area  [id number]  Debbie [name] is a 37 y.o. female coming in with complaint of coccydynia. Patient here for PRP. Patient states that she was moving some furniture and feels that everything is out of alignment.        Past Medical History:  Diagnosis Date   Abnormal heart rhythm    History of chicken pox    POTS (postural orthostatic tachycardia syndrome)    Past Surgical History:  Procedure Laterality Date   TONSILLECTOMY AND ADENOIDECTOMY  1997   WISDOM TOOTH EXTRACTION     all 4   Social History   Socioeconomic History   Marital status: Single    Spouse name: Not on file   Number of children: 0   Years of education: Not on file   Highest education level: Not on file  Occupational History   Occupation: university professor  Tobacco Use   Smoking status: Never   Smokeless tobacco: Never  Vaping Use   Vaping status: Never Used  Substance and Sexual Activity   Alcohol use: No   Drug use: No   Sexual activity: Yes    Birth control/protection: None  Other Topics Concern   Not on file  Social History Narrative   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   Single   Lives in apartment   Enjoys resting, running, reading      Social Determinants of Health   Financial Resource Strain: Not on file  Food Insecurity: Not on file  Transportation Needs: Not on file  Physical Activity: Not on file  Stress: Not on file  Social Connections: Not on file   Allergies  Allergen Reactions   Gluten Meal    Family History  Problem Relation Age of Onset   Osteoporosis Mother    Hyperlipidemia Father    Heart disease Father        CAD/defibrillator age 66   Hypertension  Father    Diabetes Father        type 2   Colon cancer Neg Hx    Esophageal cancer Neg Hx    Stomach cancer Neg Hx     Current Outpatient Medications (Endocrine & Metabolic):    predniSONE (DELTASONE) 20 MG tablet, Take 2 tablets (40 mg total) by mouth daily with breakfast.    Current Outpatient Medications (Analgesics):    meloxicam (MOBIC) 15 MG tablet, Take 1 tablet (15 mg total) by mouth daily.   Current Outpatient Medications (Other):    gabapentin (NEURONTIN) 100 MG capsule, Take 2 capsules (200 mg total) by mouth at bedtime.   venlafaxine XR (EFFEXOR-XR) 150 MG 24 hr capsule, TAKE 1 CAPSULE BY MOUTH DAILY WITH BREAKFAST.    Objective  Blood pressure 110/74, pulse 86, height 5\' 6"  (1.676 m), weight 127 lb (57.6 kg), SpO2 98%.   General: No apparent distress alert and oriented x3 mood and affect normal, dressed appropriately.  HEENT: Pupils equal, extraocular movements intact   Procedure: Real-time Ultrasound Guided Injection of right TFCC Device: GE Logiq Q7 Ultrasound guided injection is preferred based studies that show increased duration, increased effect, greater accuracy, decreased procedural pain, increased response rate, and decreased cost with ultrasound guided versus  blind injection.  Verbal informed consent obtained.  Time-out conducted.  Noted no overlying erythema, induration, or other signs of local infection.  Skin prepped in a sterile fashion.  Local anesthesia: Topical Ethyl chloride.  With sterile technique and under real time ultrasound guidance: With a 21-gauge 2 inch needle injected with 0.5 cc of 0.5% Marcaine and 2 cc of PRP Completed without difficulty  Advised to call if fevers/chills, erythema, induration, drainage, or persistent bleeding.  Impression: Technically successful ultrasound guided injection.  Procedure: Real-time Ultrasound Guided Injection of coccyx area Device: GE Logiq Q7 Ultrasound guided injection is preferred based studies  that show increased duration, increased effect, greater accuracy, decreased procedural pain, increased response rate, and decreased cost with ultrasound guided versus blind injection.  Verbal informed consent obtained.  Time-out conducted.  Noted no overlying erythema, induration, or other signs of local infection.  Skin prepped in a sterile fashion.  Local anesthesia: Topical Ethyl chloride.  With sterile technique and under real time ultrasound guidance: Going from the right buttocks.  Patient was injected with 0.5 cc of 0.5% Marcaine just deep to the coccyx bone.  Patient then was injected with 2 cc of PRP. Completed without difficulty  Advised to call if fevers/chills, erythema, induration, drainage, or persistent bleeding.  Impression: Technically successful ultrasound guided injection.    Impression and Recommendations:    The above documentation has been reviewed and is accurate and complete Judi Saa, DO

## 2022-11-02 NOTE — Progress Notes (Unsigned)
  [name] [address] [place] [address] Phone: [phone] Subjective:    I'm seeing this patient by the request  of:  Sandford Craze, NP  CC: Back and neck pain follow-up  [id number]  Debbie [name] is a 37 y.o. female coming in with complaint of back and neck pain. OMT 09/29/2022. Patient states continues to have some discomfort in the back.  Still seems to be the lower back.  Feels like everything is out of alignment at the moment.  Medications patient has been prescribed: Gabapentin, Meloxicam, Prednisone, Effexor  Taking:         Reviewed prior external information including notes and imaging from previsou exam, outside providers and external EMR if available.   As well as notes that were available from care everywhere and other healthcare systems.  Past medical history, social, surgical and family history all reviewed in electronic medical record.  No pertanent information unless stated regarding to the chief complaint.   Past Medical History:  Diagnosis Date   Abnormal heart rhythm    History of chicken pox    POTS (postural orthostatic tachycardia syndrome)     Allergies  Allergen Reactions   Gluten Meal      Review of Systems:  No headache, visual changes, nausea, vomiting, diarrhea, constipation, dizziness, abdominal pain, skin rash, fevers, chills, night sweats, weight loss, swollen lymph nodes, body aches, joint swelling, chest pain, shortness of breath, mood changes. POSITIVE muscle aches  Objective  Height 5\' 6"  (1.676 m).   General: No apparent distress alert and oriented x3 mood and affect normal, dressed appropriately.  HEENT: Pupils equal, extraocular movements intact  Respiratory: Patient's speak in full sentences and does not appear short of breath  Cardiovascular: No lower extremity edema, non tender, no erythema  Back exam does have some loss lordosis noted.  Still severely tender over the  coccyx bone.  Tightness in the paraspinal musculature of the lumbar spine as well.  Osteopathic findings  C2 flexed rotated and side bent right C7 flexed rotated and side bent left T3 extended rotated and side bent right inhaled rib T8 extended rotated and side bent left L2 flexed rotated and side bent right Sacrum right on right       Assessment and Plan:  SI (sacroiliac) joint dysfunction Attempted osteopathic manipulation again.  Will have PRP after this appointment the coccydynia.  Discussed icing regimen and home exercises.  Discussed which activities to do and which ones to avoid.  Increase activity slowly and continue to work on core strengthening.  Patient has done relatively well overall at the moment.  Will follow-up with me again in 6 weeks    Nonallopathic problems  Decision today to treat with OMT was based on Physical Exam  After verbal consent patient was treated with HVLA, ME, FPR techniques in cervical, rib, thoracic, lumbar, and sacral  areas  Patient tolerated the procedure well with improvement in symptoms  Patient given exercises, stretches and lifestyle modifications  See medications in patient instructions if given  Patient will follow up in 4-8 weeks     The above documentation has been reviewed and is accurate and complete Judi Saa, DO         Note: This dictation was prepared with Dragon dictation along with smaller phrase technology. Any transcriptional errors that result from this process are unintentional.

## 2022-11-03 ENCOUNTER — Ambulatory Visit: Payer: Self-pay | Admitting: Family Medicine

## 2022-11-03 ENCOUNTER — Encounter: Payer: Self-pay | Admitting: Family Medicine

## 2022-11-03 ENCOUNTER — Ambulatory Visit: Payer: No Typology Code available for payment source | Admitting: Family Medicine

## 2022-11-03 ENCOUNTER — Other Ambulatory Visit: Payer: Self-pay

## 2022-11-03 VITALS — Ht 66.0 in

## 2022-11-03 VITALS — BP 110/74 | HR 86 | Ht 66.0 in | Wt 127.0 lb

## 2022-11-03 DIAGNOSIS — M9902 Segmental and somatic dysfunction of thoracic region: Secondary | ICD-10-CM

## 2022-11-03 DIAGNOSIS — M9904 Segmental and somatic dysfunction of sacral region: Secondary | ICD-10-CM

## 2022-11-03 DIAGNOSIS — M9903 Segmental and somatic dysfunction of lumbar region: Secondary | ICD-10-CM

## 2022-11-03 DIAGNOSIS — M533 Sacrococcygeal disorders, not elsewhere classified: Secondary | ICD-10-CM

## 2022-11-03 DIAGNOSIS — M9908 Segmental and somatic dysfunction of rib cage: Secondary | ICD-10-CM | POA: Diagnosis not present

## 2022-11-03 DIAGNOSIS — M9901 Segmental and somatic dysfunction of cervical region: Secondary | ICD-10-CM | POA: Diagnosis not present

## 2022-11-03 NOTE — Assessment & Plan Note (Addendum)
Attempted osteopathic manipulation again.  Will have PRP after this appointment the coccydynia.  Discussed icing regimen and home exercises.  Discussed which activities to do and which ones to avoid.  Increase activity slowly and continue to work on core strengthening.  Patient has done relatively well overall at the moment.  Will follow-up with me again in 6 weeks continue effexor

## 2022-11-03 NOTE — Patient Instructions (Signed)
No ice or IBU for 3 days Heat and Tylenol are ok See me again in 6 weeks 

## 2022-11-03 NOTE — Assessment & Plan Note (Signed)
Hopeful that this will make some improvement.  Patient has not failed all other conservative therapy.  If this continues to give her trouble would consider evaluation by surgery.

## 2022-11-15 ENCOUNTER — Encounter: Payer: Self-pay | Admitting: Family Medicine

## 2022-12-14 NOTE — Progress Notes (Unsigned)
  [name] [address] [place] [address] Phone: [phone] Subjective:   Debbie [name], am serving as a scribe for Dr. Antoine Primas.  I'm seeing this patient by the request  of:  Sandford Craze, NP  CC: Low back and coccyx pain  SEG:[id number]  Debbie [name] is a 37 y.o. female coming in with complaint of back and neck pain. OMT 11/03/2022 and PRP. Patient states that she is the same after PRP.   Medications patient has been prescribed: Effexor  Taking: yes          Reviewed prior external information including notes and imaging from previsou exam, outside providers and external EMR if available.   As well as notes that were available from care everywhere and other healthcare systems.  Past medical history, social, surgical and family history all reviewed in electronic medical record.  No pertanent information unless stated regarding to the chief complaint.   Past Medical History:  Diagnosis Date   Abnormal heart rhythm    History of chicken pox    POTS (postural orthostatic tachycardia syndrome)     Allergies  Allergen Reactions   Gluten Meal      Review of Systems:  No headache, visual changes, nausea, vomiting, diarrhea, constipation, dizziness, abdominal pain, skin rash, fevers, chills, night sweats, weight loss, swollen lymph nodes, body aches, joint swelling, chest pain, shortness of breath, mood changes. POSITIVE muscle aches  Objective  Blood pressure 106/64, pulse 88, height 5\' 6"  (1.676 m), weight 129 lb (58.5 kg), SpO2 99%.   General: No apparent distress alert and oriented x3 mood and affect normal, dressed appropriately.  HEENT: Pupils equal, extraocular movements intact  Respiratory: Patient's speak in full sentences and does not appear short of breath  Cardiovascular: No lower extremity edema, non tender, no erythema  Low back exam does have some loss of lordosis noted.  Hypermobility noted.   Still severe tenderness over the coccyx bone itself. Osteopathic findings  C5 flexed rotated and side bent left T3 extended rotated and side bent right inhaled rib T9 extended rotated and side bent left L3 flexed rotated and side bent right Sacrum right on right       Assessment and Plan:  Coccydynia 700 significant improvement.  Discussed which activities to do injections to avoid.  Increase activity slowly.  Discussed which activities to still avoid.  Hopeful that this will make some improvement but it may be slow at this moment.  Follow-up again in 6 to 8 weeks    Nonallopathic problems  Decision today to treat with OMT was based on Physical Exam  After verbal consent patient was treated with HVLA, ME, FPR techniques in cervical, rib, thoracic, lumbar, and sacral  areas  Patient tolerated the procedure well with improvement in symptoms  Patient given exercises, stretches and lifestyle modifications  See medications in patient instructions if given  Patient will follow up in 4-8 weeks     The above documentation has been reviewed and is accurate and complete Judi Saa, DO         Note: This dictation was prepared with Dragon dictation along with smaller phrase technology. Any transcriptional errors that result from this process are unintentional.

## 2022-12-15 ENCOUNTER — Encounter: Payer: Self-pay | Admitting: Family Medicine

## 2022-12-15 ENCOUNTER — Ambulatory Visit: Payer: No Typology Code available for payment source | Admitting: Family Medicine

## 2022-12-15 VITALS — BP 106/64 | HR 88 | Ht 66.0 in | Wt 129.0 lb

## 2022-12-15 DIAGNOSIS — M9908 Segmental and somatic dysfunction of rib cage: Secondary | ICD-10-CM | POA: Diagnosis not present

## 2022-12-15 DIAGNOSIS — M9902 Segmental and somatic dysfunction of thoracic region: Secondary | ICD-10-CM

## 2022-12-15 DIAGNOSIS — M9904 Segmental and somatic dysfunction of sacral region: Secondary | ICD-10-CM | POA: Diagnosis not present

## 2022-12-15 DIAGNOSIS — M9903 Segmental and somatic dysfunction of lumbar region: Secondary | ICD-10-CM | POA: Diagnosis not present

## 2022-12-15 DIAGNOSIS — M533 Sacrococcygeal disorders, not elsewhere classified: Secondary | ICD-10-CM | POA: Diagnosis not present

## 2022-12-15 DIAGNOSIS — M9901 Segmental and somatic dysfunction of cervical region: Secondary | ICD-10-CM

## 2022-12-15 NOTE — Assessment & Plan Note (Addendum)
No  significant improvement.  Discussed which activities to do injections to avoid.  Increase activity slowly.  Discussed which activities to still avoid.  Hopeful that this will make some improvement but it may be slow at this moment.  Follow-up again in 6 to 8 weeks still will be hopeful that the PRP will make a difference.  Continue to work on core strengthening.  Follow-up again in 6 to 8 weeks otherwise.

## 2022-12-15 NOTE — Patient Instructions (Signed)
Good to see you! Debbie [name] keep my fingers crossed See you again in 6-7 weeks

## 2023-02-01 NOTE — Progress Notes (Unsigned)
  [name] [address] [place] [address] Phone: [phone] Subjective:   Debbie [name], am serving as a scribe for Dr. Antoine Primas.  I'm seeing this patient by the request  of:  Sandford Craze, NP  CC: back and neck pain follow up   OZH:[id number]  Debbie [name] is a 37 y.o. female coming in with complaint of back and neck pain. OMT 12/15/2022. Patient states same per usual. A little worse the last 2 weeks. No new concerns.  Medications patient has been prescribed: Effexor  Taking: yes          Reviewed prior external information including notes and imaging from previsou exam, outside providers and external EMR if available.   As well as notes that were available from care everywhere and other healthcare systems.  Past medical history, social, surgical and family history all reviewed in electronic medical record.  No pertanent information unless stated regarding to the chief complaint.   Past Medical History:  Diagnosis Date   Abnormal heart rhythm    History of chicken pox    POTS (postural orthostatic tachycardia syndrome)     Allergies  Allergen Reactions   Gluten Meal      Review of Systems:  No headache, visual changes, nausea, vomiting, diarrhea, constipation, dizziness, abdominal pain, skin rash, fevers, chills, night sweats, weight loss, swollen lymph nodes, body aches, joint swelling, chest pain, shortness of breath, mood changes. POSITIVE muscle aches  Objective  Blood pressure 106/74, pulse 63, height 5\' 6"  (1.676 m), weight 129 lb (58.5 kg), SpO2 99%.   General: No apparent distress alert and oriented x3 mood and affect normal, dressed appropriately.  HEENT: Pupils equal, extraocular movements intact  Respiratory: Patient's speak in full sentences and does not appear short of breath  Cardiovascular: No lower extremity edema, non tender, no erythema  Gait MSK:  Back loss of lordosis noted.   Tenderness to palpation in the paraspinal musculature.  Osteopathic findings  C2 flexed rotated and side bent right C7 flexed rotated and side bent left T3 extended rotated and side bent right inhaled rib T5 extended rotated and side bent left L3 flexed rotated and side bent right Sacrum right on right       Assessment and Plan:  SI (sacroiliac) joint dysfunction Chronic problem that did respond well to the weightlifting as well as osteopathic manipulation.  Discussed icing regimen and home exercises.  Discussed which activities to do and which ones to avoid.  Increase activity slowly otherwise.  Follow-up again in 6 to 8 weeks    Nonallopathic problems  Decision today to treat with OMT was based on Physical Exam  After verbal consent patient was treated with HVLA, ME, FPR techniques in cervical, rib, thoracic, lumbar, and sacral  areas  Patient tolerated the procedure well with improvement in symptoms  Patient given exercises, stretches and lifestyle modifications  See medications in patient instructions if given  Patient will follow up in 4-8 weeks     The above documentation has been reviewed and is accurate and complete Judi Saa, DO         Note: This dictation was prepared with Dragon dictation along with smaller phrase technology. Any transcriptional errors that result from this process are unintentional.

## 2023-02-02 ENCOUNTER — Encounter: Payer: Self-pay | Admitting: Family Medicine

## 2023-02-02 ENCOUNTER — Ambulatory Visit: Payer: No Typology Code available for payment source | Admitting: Family Medicine

## 2023-02-02 VITALS — BP 106/74 | HR 63 | Ht 66.0 in | Wt 129.0 lb

## 2023-02-02 DIAGNOSIS — M533 Sacrococcygeal disorders, not elsewhere classified: Secondary | ICD-10-CM

## 2023-02-02 DIAGNOSIS — M9902 Segmental and somatic dysfunction of thoracic region: Secondary | ICD-10-CM

## 2023-02-02 DIAGNOSIS — M9901 Segmental and somatic dysfunction of cervical region: Secondary | ICD-10-CM | POA: Diagnosis not present

## 2023-02-02 DIAGNOSIS — M9904 Segmental and somatic dysfunction of sacral region: Secondary | ICD-10-CM | POA: Diagnosis not present

## 2023-02-02 DIAGNOSIS — M9908 Segmental and somatic dysfunction of rib cage: Secondary | ICD-10-CM | POA: Diagnosis not present

## 2023-02-02 DIAGNOSIS — M9903 Segmental and somatic dysfunction of lumbar region: Secondary | ICD-10-CM | POA: Diagnosis not present

## 2023-02-02 NOTE — Patient Instructions (Signed)
Keep working with the [facility] of [place] See you again in 7-8 weeks

## 2023-02-02 NOTE — Assessment & Plan Note (Signed)
Chronic problem that did respond well to the weightlifting as well as osteopathic manipulation.  Discussed icing regimen and home exercises.  Discussed which activities to do and which ones to avoid.  Increase activity slowly otherwise.  Follow-up again in 6 to 8 weeks

## 2023-02-07 ENCOUNTER — Ambulatory Visit (INDEPENDENT_AMBULATORY_CARE_PROVIDER_SITE_OTHER): Payer: No Typology Code available for payment source | Admitting: Family

## 2023-02-07 ENCOUNTER — Encounter: Payer: Self-pay | Admitting: Family

## 2023-02-07 VITALS — BP 126/72 | HR 64 | Temp 98.8°F | Resp 16 | Ht 66.5 in | Wt 129.0 lb

## 2023-02-07 DIAGNOSIS — Z91018 Allergy to other foods: Secondary | ICD-10-CM | POA: Diagnosis not present

## 2023-02-07 DIAGNOSIS — E785 Hyperlipidemia, unspecified: Secondary | ICD-10-CM | POA: Diagnosis not present

## 2023-02-07 DIAGNOSIS — Z0000 Encounter for general adult medical examination without abnormal findings: Secondary | ICD-10-CM

## 2023-02-07 DIAGNOSIS — Z1159 Encounter for screening for other viral diseases: Secondary | ICD-10-CM

## 2023-02-07 DIAGNOSIS — R140 Abdominal distension (gaseous): Secondary | ICD-10-CM | POA: Insufficient documentation

## 2023-02-07 DIAGNOSIS — G90A Postural orthostatic tachycardia syndrome (POTS): Secondary | ICD-10-CM

## 2023-02-07 LAB — LIPID PANEL
Cholesterol: 240 mg/dL — ABNORMAL HIGH (ref 0–200)
HDL: 91.8 mg/dL (ref >39.00)
LDL Cholesterol: 131 mg/dL — ABNORMAL HIGH (ref 0–99)
NonHDL: 147.96
Total CHOL/HDL Ratio: 3
Triglycerides: 84 mg/dL (ref 0.0–149.0)
VLDL: 16.8 mg/dL (ref 0.0–40.0)

## 2023-02-07 MED ORDER — EPINEPHRINE 0.3 MG/0.3ML IJ SOAJ: 0.3000 mg | INTRAMUSCULAR | 0 refills | Status: AC | PRN

## 2023-02-07 NOTE — Assessment & Plan Note (Signed)
  Stable, no current symptoms. -Continue current management.

## 2023-02-07 NOTE — Assessment & Plan Note (Addendum)
-  continue healthy diet and regular exercise. -Order Hepatitis C screening and lipid panel. -Plan for mammogram screening after upcoming birthday. -Next Pap smear due in 2026.

## 2023-02-07 NOTE — Progress Notes (Signed)
Subjective:     Patient ID: Debbie [name], female    DOB: [birth year], 37 y.o.   MRN: [id number]  No chief complaint on file.   HPI  Discussed the use of AI scribe software for clinical note transcription with the patient, who gave verbal consent to proceed.  History of Present Illness   The patient, with a history of POTS and IBS, presents for a physical with ongoing concerns of abdominal bloating and suspected food allergies. She describes a sensation of "retaining a lot of water or air" in her abdomen, which worsens throughout the day after eating or drinking. This has been a persistent issue for several years. Despite evaluation by gastroenterology and a diagnosis of IBS, the patient feels her symptoms are not adequately addressed.  The patient has self-imposed dietary restrictions, avoiding gluten and chicken due to suspected allergies. She reports severe itching for a week after consuming these foods. Despite negative allergy testing, the patient has found that avoiding these foods has improved her symptoms.  The patient also reports a history of nerve pain, which was managed with medication. She has a history of heart palpitations, which were evaluated in the ER and attributed to stress. The patient's POTS symptoms have improved significantly and are currently well-managed with diet.  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 She expresses willingness to try a restrictive diet, such as the FODMAP diet, to help manage her abdominal symptoms, but is concerned about maintaining adequate food intake due to her job demands.     Immunizations: declines covid booster, flu shot up to date. Diet: gluten free, no chicken Exercise: runs 20 minutes several times a week, swims several times a week, weights 2 x a week. Pap Smear: du 08/2024 Mammogram: will start at 38 Vision: up to date Dental: up to date     Health Maintenance Due  Topic Date Due    Hepatitis C Screening  Never done   COVID-19 Vaccine (3 - 2023-24 season) 12/04/2022    Past Medical History:  Diagnosis Date   Abnormal heart rhythm    History of chicken pox    POTS (postural orthostatic tachycardia syndrome)     Past Surgical History:  Procedure Laterality Date   TONSILLECTOMY AND ADENOIDECTOMY  1997   WISDOM TOOTH EXTRACTION     all 4    Family History  Problem Relation Age of Onset   Osteoporosis Mother    Hyperlipidemia Father    Heart disease Father        CAD/defibrillator age 13   Hypertension Father    Diabetes Father        type 2   Leukemia Maternal Grandmother    Colon cancer Neg Hx    Esophageal cancer Neg Hx    Stomach cancer Neg Hx     Social History   Socioeconomic History   Marital status: Single    Spouse name: Not on file   Number of children: 0   Years of education: Not on file   Highest education level: Not on file  Occupational History   Occupation: university professor  Tobacco Use   Smoking status: Never   Smokeless tobacco: Never  Vaping Use   Vaping status: Never Used  Substance and Sexual Activity   Alcohol use: No   Drug use: No   Sexual activity: Yes    Birth control/protection: None  Other Topics Concern   Not on file  Social History  Narrative   Philosophy professor at [employer]   PhD   Single   Lives in apartment   Enjoys resting, running, reading      Social Determinants of Health   Financial Resource Strain: Not on file  Food Insecurity: Not on file  Transportation Needs: Not on file  Physical Activity: Not on file  Stress: Not on file  Social Connections: Not on file  Intimate Partner Violence: Not on file    Outpatient Medications Prior to Visit  Medication Sig Dispense Refill   gabapentin (NEURONTIN) 100 MG capsule Take 2 capsules (200 mg total) by mouth at bedtime. 180 capsule 0   meloxicam (MOBIC) 15 MG tablet Take 1 tablet (15 mg total) by mouth daily. 90 tablet 0   predniSONE  (DELTASONE) 20 MG tablet Take 2 tablets (40 mg total) by mouth daily with breakfast. 10 tablet 0   venlafaxine XR (EFFEXOR-XR) 150 MG 24 hr capsule TAKE 1 CAPSULE BY MOUTH DAILY WITH BREAKFAST. 90 capsule 2   No facility-administered medications prior to visit.    Allergies  Allergen Reactions   Chicken Allergy     Itching all over x 1 week   Gluten Meal     Review of Systems  Constitutional:  Negative for weight loss.  HENT:  Negative for congestion and hearing loss.   Eyes:  Negative for blurred vision.  Respiratory:  Negative for cough.   Cardiovascular:  Negative for leg swelling.  Gastrointestinal:  Positive for constipation (mild- uses miralax prn). Negative for diarrhea.  Genitourinary:  Negative for dysuria and frequency.  Musculoskeletal:  Negative for joint pain and myalgias.       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  Neurological:  Negative for headaches.  Psychiatric/Behavioral:         Denies depression/anxiety       Objective:    Physical Exam   BP 126/72 (BP Location: Right Arm, Patient Position: Sitting, Cuff Size: Small)   Pulse 64   Temp 98.8 F (37.1 C) (Oral)   Resp 16   Ht 5' 6.5" (1.689 m)   Wt 129 lb (58.5 kg)   SpO2 100%   BMI 20.51 kg/m  Wt Readings from Last 3 Encounters:  02/07/23 129 lb (58.5 kg)  02/02/23 129 lb (58.5 kg)  12/15/22 129 lb (58.5 kg)   Lab Results  Component Value Date   CHOL 221 (H) 11/02/2015   HDL 88.40 11/02/2015   LDLCALC 120 (H) 11/02/2015   TRIG 67.0 11/02/2015   CHOLHDL 3 11/02/2015   Physical Exam  Constitutional: She is oriented to person, place, and time. She appears well-developed and well-nourished. No distress.  HENT:  Head: Normocephalic and atraumatic.  Right Ear: Tympanic membrane and ear canal normal.  Left Ear: Tympanic membrane and ear canal normal.  Mouth/Throat: Oropharynx is clear and moist.  Eyes: Pupils are equal, round, and reactive to light. No scleral  icterus.  Neck: Normal range of motion. No thyromegaly present.  Cardiovascular: Normal rate and regular rhythm.   No murmur heard. Pulmonary/Chest: Effort normal and breath sounds normal. No respiratory distress. He has no wheezes. She has no rales. She exhibits no tenderness.  Abdominal: Soft. Bowel sounds are normal. She exhibits no distension and no mass. There is no tenderness. There is no rebound and no guarding.  Musculoskeletal: She exhibits no edema.  Lymphadenopathy:    She has no cervical adenopathy.  Neurological: She is alert and oriented to  person, place, and time. She has normal patellar reflexes. She exhibits normal muscle tone. Coordination normal.  Skin: Skin is warm and dry.  Psychiatric: She has a normal mood and affect. Her behavior is normal. Judgment and thought content normal.  Breast/pelvic: deferred         Assessment & Plan:       Assessment & Plan:   Problem List Items Addressed This Visit       Unprioritized   Preventative health care - Primary    -continue healthy diet and regular exercise. -Order Hepatitis C screening and lipid panel. -Plan for mammogram screening after upcoming birthday. -Next Pap smear due in 2026.      POTS (postural orthostatic tachycardia syndrome)     Stable, no current symptoms. -Continue current management.       Relevant Medications   EPINEPHrine (EPIPEN 2-PAK) 0.3 mg/0.3 mL IJ SOAJ injection   Food allergy     Reports systemic itching after consuming chicken, but allergy testing was negative. -Prescribe EpiPen for emergency use in case of severe allergic reaction. -Advise to carry liquid Benadryl for immediate use in case of allergic reaction.      Abdominal bloating     Chronic abdominal bloating, possibly due to IBS or food sensitivities. No improvement with probiotics or digestive enzymes. Currently on a gluten-free diet and avoids chicken due to suspected allergy. -Trial of low FODMAP diet  suggested. -Continue Miralax as needed for constipation.      Other Visit Diagnoses     Hyperlipidemia, unspecified hyperlipidemia type       Relevant Medications   EPINEPHrine (EPIPEN 2-PAK) 0.3 mg/0.3 mL IJ SOAJ injection   Other Relevant Orders   Lipid panel   Encounter for hepatitis C screening test for low risk patient       Relevant Orders   Hepatitis C Antibody       I am having Debbie [name] start on EPINEPHrine. I am also having her maintain her gabapentin, meloxicam, venlafaxine XR, and predniSONE.  Meds ordered this encounter  Medications   EPINEPHrine (EPIPEN 2-PAK) 0.3 mg/0.3 mL IJ SOAJ injection    Sig: Inject 0.3 mg into the muscle as needed for anaphylaxis.    Dispense:  1 each    Refill:  0    Order Specific Question:   Supervising Provider    Answer:   Danise Edge A [id number]

## 2023-02-07 NOTE — Patient Instructions (Signed)
VISIT SUMMARY:  During today's visit, we discussed your ongoing concerns about abdominal bloating and suspected food allergies. We reviewed your history of POTS and IBS, and your current symptoms and dietary restrictions. We also addressed your history of nerve pain and heart palpitations. A plan was made to help manage your symptoms and maintain your overall health.  YOUR PLAN:  -ABDOMINAL BLOATING: Abdominal bloating can be caused by various factors, including IBS or food sensitivities. We recommend trying a low FODMAP diet to see if it helps alleviate your symptoms. Continue using Miralax as needed for constipation relief.  -SUSPECTED FOOD ALLERGY (CHICKEN): Although your allergy tests for chicken were negative, you experience severe itching after consuming it. We have prescribed an EpiPen for emergency use in case of a severe allergic reaction and advised you to carry liquid Benadryl for immediate use if needed.  -GENERAL HEALTH MAINTENANCE: We have ordered a Hepatitis C screening and a lipid panel to monitor your health. We also plan to schedule a mammogram screening after your upcoming birthday, and your next Pap smear is due in 2026.  -POSTURAL ORTHOSTATIC TACHYCARDIA SYNDROME (POTS): POTS is a condition that affects blood flow and can cause symptoms like dizziness and fainting. Your symptoms are currently stable and well-managed with your current diet. Continue with your current management plan.  INSTRUCTIONS:  Please follow up to review the results of your lab tests. If you find the low FODMAP diet helpful, we may consider referring you to a dietitian for further guidance.

## 2023-02-07 NOTE — Assessment & Plan Note (Signed)
  Reports systemic itching after consuming chicken, but allergy testing was negative. -Prescribe EpiPen for emergency use in case of severe allergic reaction. -Advise to carry liquid Benadryl for immediate use in case of allergic reaction.

## 2023-02-07 NOTE — Assessment & Plan Note (Signed)
  Chronic abdominal bloating, possibly due to IBS or food sensitivities. No improvement with probiotics or digestive enzymes. Currently on a gluten-free diet and avoids chicken due to suspected allergy. -Trial of low FODMAP diet suggested. -Continue Miralax as needed for constipation.

## 2023-02-08 LAB — HEPATITIS C ANTIBODY: Hepatitis C Ab: NONREACTIVE

## 2023-03-13 ENCOUNTER — Encounter: Payer: Self-pay | Admitting: Family Medicine

## 2023-03-14 ENCOUNTER — Other Ambulatory Visit: Payer: Self-pay

## 2023-03-14 MED ORDER — PREDNISONE 20 MG PO TABS: 40.0000 mg | ORAL_TABLET | Freq: Every day | ORAL | 0 refills | Status: DC

## 2023-03-22 NOTE — Progress Notes (Unsigned)
[name] [address] [place] [address] Phone: [phone] Subjective:   Debbie [name], am serving as a scribe for Dr. Antoine Primas.  I'm seeing this patient by the request  of:  Sandford Craze, NP  CC: Back and neck pain follow-up  [id number]  [name] is a 37 y.o. female coming in with complaint of back and neck pain. OMT 02/02/2023.  Patient sent a message at the early December stating that she was having an increasing amount of pain in the coccygeal or sacral area.  Meloxicam did not seem to be potentially helpful.  Was given prednisone 40 mg for 5 days.  Patient states that prednisone was helpful but pain is still present. Had to sit a lot last week.   Medications patient has been prescribed: Prednisone, Effexor  Taking: yes          Reviewed prior external information including notes and imaging from previsou exam, outside providers and external EMR if available.   As well as notes that were available from care everywhere and other healthcare systems.  Past medical history, social, surgical and family history all reviewed in electronic medical record.  No pertanent information unless stated regarding to the chief complaint.   Past Medical History:  Diagnosis Date   Abnormal heart rhythm    History of chicken pox    POTS (postural orthostatic tachycardia syndrome)     Allergies  Allergen Reactions   Chicken Allergy     Itching all over x 1 week   Gluten Meal      Review of Systems:  No headache, visual changes, nausea, vomiting, diarrhea, constipation, dizziness, abdominal pain, skin rash, fevers, chills, night sweats, weight loss, swollen lymph nodes, body aches, joint swelling, chest pain, shortness of breath, mood changes. POSITIVE muscle aches  Objective  Blood pressure 108/78, pulse 78, height 5' 6.5" (1.689 m), weight 126 lb (57.2 kg), SpO2 98%.   General: No apparent distress alert and  oriented x3 mood and affect normal, dressed appropriately.  HEENT: Pupils equal, extraocular movements intact  Respiratory: Patient's speak in full sentences and does not appear short of breath  Cardiovascular: No lower extremity edema, non tender, no erythema  MSK:  Back tenderness over the coccyx bone still noted.  Tightness noted with Pearlean Brownie right greater than left.  Osteopathic findings  C2 flexed rotated and side bent right C5 flexed rotated and side bent right T9 extended rotated and side bent left L2 flexed rotated and side bent right Sacrum right on right Pelvic shear right   Assessment and Plan:  Coccydynia Chronic problem with exacerbation again.  Discussed with patient only other ideas would be a colonoscopy at this time.  Attempted osteopathic manipulation and hopeful that it will make some type of improvement.  Discussed icing regimen and home exercises, continue to work on core strengthening and pelvic floor strengthening.  Follow-up again in 6 to 8 weeks    Nonallopathic problems  Decision today to treat with OMT was based on Physical Exam  After verbal consent patient was treated with HVLA, ME, FPR techniques in cervical,  thoracic, lumbar, and sacral  areas  Patient tolerated the procedure well with improvement in symptoms  Patient given exercises, stretches and lifestyle modifications  See medications in patient instructions if given  Patient will follow up in 4-8 weeks      The above documentation has been reviewed and is accurate and complete Judi Saa, DO  Note: This dictation was prepared with Dragon dictation along with smaller phrase technology. Any transcriptional errors that result from this process are unintentional.

## 2023-03-23 ENCOUNTER — Encounter: Payer: Self-pay | Admitting: Family Medicine

## 2023-03-23 ENCOUNTER — Ambulatory Visit: Payer: No Typology Code available for payment source | Admitting: Family Medicine

## 2023-03-23 VITALS — BP 108/78 | HR 78 | Ht 66.5 in | Wt 126.0 lb

## 2023-03-23 DIAGNOSIS — M9903 Segmental and somatic dysfunction of lumbar region: Secondary | ICD-10-CM

## 2023-03-23 DIAGNOSIS — M533 Sacrococcygeal disorders, not elsewhere classified: Secondary | ICD-10-CM | POA: Diagnosis not present

## 2023-03-23 DIAGNOSIS — M9902 Segmental and somatic dysfunction of thoracic region: Secondary | ICD-10-CM | POA: Diagnosis not present

## 2023-03-23 DIAGNOSIS — M9901 Segmental and somatic dysfunction of cervical region: Secondary | ICD-10-CM

## 2023-03-23 DIAGNOSIS — M9904 Segmental and somatic dysfunction of sacral region: Secondary | ICD-10-CM

## 2023-03-23 MED ORDER — GABAPENTIN 100 MG PO CAPS: 200.0000 mg | ORAL_CAPSULE | Freq: Every day | ORAL | 0 refills | Status: DC

## 2023-03-23 MED ORDER — NAPROXEN 500 MG PO TABS: 500.0000 mg | ORAL_TABLET | Freq: Two times a day (BID) | ORAL | 1 refills | Status: DC | PRN

## 2023-03-23 NOTE — Patient Instructions (Addendum)
Good to see you See me again in 6-8 weeks 

## 2023-03-23 NOTE — Assessment & Plan Note (Signed)
Chronic problem with exacerbation again.  Discussed with patient only other ideas would be a colonoscopy at this time.  Attempted osteopathic manipulation and hopeful that it will make some type of improvement.  Discussed icing regimen and home exercises, continue to work on core strengthening and pelvic floor strengthening.  Follow-up again in 6 to 8 weeks

## 2023-05-04 NOTE — Progress Notes (Signed)
 [name] [address] [place] [address] Phone: [phone] Subjective:   Debbie [name], am serving as a scribe for Dr. Arthea Claudene.  I'm seeing this patient by the request  of:  Debbie Setter, NP  CC: low back and coccydynia pain   YEP:Dlagzrupcz  Debbie [name] is a 38 y.o. female coming in with complaint of back and neck pain. OMT 03/23/2023. Patient states kind of bad lately. Never good but it feels like it has been worse the past 6 to 8 weeks. It does not seem to be decreasing. Some of the medications upset her stomach so she doesn't know what else to do next. Celkebre  Medications patient has been prescribed: Gabapentin , Effexor   Taking: yes         Reviewed prior external information including notes and imaging from previsou exam, outside providers and external EMR if available.   As well as notes that were available from care everywhere and other healthcare systems.  Past medical history, social, surgical and family history all reviewed in electronic medical record.  No pertanent information unless stated regarding to the chief complaint.   Past Medical History:  Diagnosis Date   Abnormal heart rhythm    History of chicken pox    POTS (postural orthostatic tachycardia syndrome)     Allergies  Allergen Reactions   Chicken Allergy     Itching all over x 1 week   Gluten Meal      Review of Systems:  No headache, visual changes, nausea, vomiting, diarrhea, constipation, dizziness, abdominal pain, skin rash, fevers, chills, night sweats, weight loss, swollen lymph nodes, body aches, joint swelling, chest pain, shortness of breath, mood changes. POSITIVE muscle aches  Objective  Blood pressure 100/60, pulse 73, height 5' 5.5 (1.664 m), weight 127 lb 3.2 oz (57.7 kg), SpO2 99%.   General: No apparent distress alert and oriented x3 mood and affect normal, dressed appropriately.  HEENT: Pupils equal,  extraocular movements intact  Respiratory: Patient's speak in full sentences and does not appear short of breath  Cardiovascular: No lower extremity edema, non tender, no erythema  MSK:  Back does have some loss lordosis noted.  Some tenderness to palpation in the paraspinal musculature.  Still significant amount of pain over the coccyx bone itself.  Some worsening pain with extension of the back.  Patient is able to sit but does sit at an angle.  Osteopathic findings  C2 flexed rotated and side bent right C7 flexed rotated and side bent left T3 extended rotated and side bent right inhaled rib T7 extended rotated and side bent left L2 flexed rotated and side bent right L5 flexed rotated and side bent left Sacrum right on right     Assessment and Plan:  Coccydynia Continues to have significant difficulty.  Discussed with patient that icing regimen and home exercises.  Will need to consider potentially changing medications if this continues to give patient a problem.  Discussed which activities to do and which ones to avoid.  Increase activity slowly otherwise.  Follow-up again in 6 to 8 weeks. Started Celebrex  100 mg twice a day discontinue all other anti-inflammatories  Nonallopathic problems  Decision today to treat with OMT was based on Physical Exam  After verbal consent patient was treated with HVLA, ME, FPR techniques in cervical, rib, thoracic, lumbar, and sacral  areas  Patient tolerated the procedure well with improvement in symptoms  Patient given exercises, stretches and lifestyle modifications  See medications in patient instructions if given  Patient will follow up in 4-8 weeks     The above documentation has been reviewed and is accurate and complete Debbie Tavano M Lekia Nier, DO         Note: This dictation was prepared with Dragon dictation along with smaller phrase technology. Any transcriptional errors that result from this process are unintentional.

## 2023-05-09 ENCOUNTER — Ambulatory Visit (INDEPENDENT_AMBULATORY_CARE_PROVIDER_SITE_OTHER): Payer: No Typology Code available for payment source | Admitting: Family Medicine

## 2023-05-09 VITALS — BP 100/60 | HR 73 | Ht 65.5 in | Wt 127.2 lb

## 2023-05-09 DIAGNOSIS — M533 Sacrococcygeal disorders, not elsewhere classified: Secondary | ICD-10-CM

## 2023-05-09 DIAGNOSIS — M9901 Segmental and somatic dysfunction of cervical region: Secondary | ICD-10-CM

## 2023-05-09 DIAGNOSIS — M9903 Segmental and somatic dysfunction of lumbar region: Secondary | ICD-10-CM | POA: Diagnosis not present

## 2023-05-09 DIAGNOSIS — M9908 Segmental and somatic dysfunction of rib cage: Secondary | ICD-10-CM

## 2023-05-09 DIAGNOSIS — M9902 Segmental and somatic dysfunction of thoracic region: Secondary | ICD-10-CM

## 2023-05-09 DIAGNOSIS — M9904 Segmental and somatic dysfunction of sacral region: Secondary | ICD-10-CM | POA: Diagnosis not present

## 2023-05-09 MED ORDER — CELECOXIB 100 MG PO CAPS: 100.0000 mg | ORAL_CAPSULE | Freq: Two times a day (BID) | ORAL | 0 refills | Status: DC | PRN

## 2023-05-09 NOTE — Assessment & Plan Note (Signed)
 Continues to have significant difficulty.  Discussed with patient that icing regimen and home exercises.  Will need to consider potentially changing medications if this continues to give patient a problem.  Discussed which activities to do and which ones to avoid.  Increase activity slowly otherwise.  Follow-up again in 6 to 8 weeks.

## 2023-05-09 NOTE — Patient Instructions (Signed)
Celebrex 100mg  2x a day prn Stop all other NSAIDs See me in 5 weeks

## 2023-05-26 ENCOUNTER — Other Ambulatory Visit: Payer: Self-pay | Admitting: Family Medicine

## 2023-06-12 NOTE — Progress Notes (Unsigned)
 [name] [address] [place] [address] Phone: [phone] Subjective:   Debbie [name], am serving as a scribe for Dr. Antoine Primas.  I'm seeing this patient by the request  of:  Sandford Craze, NP  CC: back and neck pain follow up   LOV:[id number]  Debbie [name] is a 38 y.o. female coming in with complaint of back and neck pain. OMT 05/09/2023. Patient states that she took celebrex and had heart palpitations.  Patient states that continues to have pain on daily basis.  Concerning for her trip to  in the near future.  Airplane ride seem to make it very difficult.  Medications patient has been prescribed: Celebrex, Effexor  Taking: No longer taking the Celebrex         Reviewed prior external information including notes and imaging from previsou exam, outside providers and external EMR if available.   As well as notes that were available from care everywhere and other healthcare systems.  Past medical history, social, surgical and family history all reviewed in electronic medical record.  No pertanent information unless stated regarding to the chief complaint.   Past Medical History:  Diagnosis Date   Abnormal heart rhythm    History of chicken pox    POTS (postural orthostatic tachycardia syndrome)     Allergies  Allergen Reactions   Chicken Allergy     Itching all over x 1 week   Gluten Meal      Review of Systems:  No headache, visual changes, nausea, vomiting, diarrhea, constipation, dizziness, abdominal pain, skin rash, fevers, chills, night sweats, weight loss, swollen lymph nodes, body aches, joint swelling, chest pain, shortness of breath, mood changes. POSITIVE muscle aches  Objective  Blood pressure 92/68, pulse 90, height 5' 5.5" (1.664 m), weight 125 lb (56.7 kg), SpO2 98%.   General: No apparent distress alert and oriented x3 mood and affect normal, dressed appropriately.  HEENT: Pupils  equal, extraocular movements intact  Respiratory: Patient's speak in full sentences and does not appear short of breath  Cardiovascular: No lower extremity edema, non tender, no erythema  Gait MSK:  Back does have some hypermobility noted.  Tender to palpation in the paraspinal musculature.  Still has significant tightness over the pelvic floor noted.  Osteopathic findings  C2 flexed rotated and side bent right C6 flexed rotated and side bent left T3 extended rotated and side bent right inhaled rib T9 extended rotated and side bent left L2 flexed rotated and side bent right Sacrum right on right Pelvic shear noted.      Assessment and Plan:  Coccydynia Continues to have chronic pain.  Have tried injection with no help.  Will consider the possibility of shockwave therapy but very low evidence of this making a difference.  Patient has done formal physical therapy, TENS unit as well.  Tried multiple different medications.  Having significant difficulty overall.  Follow-up with me again in 6 to 8 weeks otherwise.  Prednisone given for patient's trip.    Nonallopathic problems  Decision today to treat with OMT was based on Physical Exam  After verbal consent patient was treated with HVLA, ME, FPR techniques in cervical, rib, thoracic, lumbar, and sacral  areas  Patient tolerated the procedure well with improvement in symptoms  Patient given exercises, stretches and lifestyle modifications  See medications in patient instructions if given  Patient will follow up in 4-8 weeks    The above documentation has been reviewed and  is accurate and complete Judi Saa, DO          Note: This dictation was prepared with Dragon dictation along with smaller phrase technology. Any transcriptional errors that result from this process are unintentional.

## 2023-06-13 ENCOUNTER — Ambulatory Visit: Payer: No Typology Code available for payment source | Admitting: Family Medicine

## 2023-06-13 VITALS — BP 92/68 | HR 90 | Ht 65.5 in | Wt 125.0 lb

## 2023-06-13 DIAGNOSIS — M9908 Segmental and somatic dysfunction of rib cage: Secondary | ICD-10-CM

## 2023-06-13 DIAGNOSIS — M9901 Segmental and somatic dysfunction of cervical region: Secondary | ICD-10-CM | POA: Diagnosis not present

## 2023-06-13 DIAGNOSIS — M9904 Segmental and somatic dysfunction of sacral region: Secondary | ICD-10-CM

## 2023-06-13 DIAGNOSIS — M533 Sacrococcygeal disorders, not elsewhere classified: Secondary | ICD-10-CM | POA: Diagnosis not present

## 2023-06-13 DIAGNOSIS — M9902 Segmental and somatic dysfunction of thoracic region: Secondary | ICD-10-CM

## 2023-06-13 DIAGNOSIS — M9903 Segmental and somatic dysfunction of lumbar region: Secondary | ICD-10-CM

## 2023-06-13 MED ORDER — PREDNISONE 20 MG PO TABS: 40.0000 mg | ORAL_TABLET | Freq: Every day | ORAL | 0 refills | Status: DC

## 2023-06-13 NOTE — Assessment & Plan Note (Addendum)
 Continues to have chronic pain.  Have tried injection with no help.  Will consider the possibility of shockwave therapy but very low evidence of this making a difference.  Patient has done formal physical therapy, TENS unit as well.  Tried multiple different medications.  Having significant difficulty overall.  Follow-up with me again in 6 to 8 weeks otherwise.  Prednisone given for patient's trip.

## 2023-06-13 NOTE — Patient Instructions (Signed)
 Prednisone 40mg  for 5 days Extra 20mg  for extra 5 days if needed Enjoy the cheese state See me in 2 months

## 2023-06-28 ENCOUNTER — Other Ambulatory Visit: Payer: Self-pay | Admitting: Family Medicine

## 2023-08-10 NOTE — Progress Notes (Signed)
  [facility] [address] [place] [address] Phone: [phone] Subjective:   Debbie [name], am serving as a scribe for Dr. Ronnell Coins.  I'm seeing this patient by the request  of:  Dorrene Gaucher, NP  CC: Back and neck pain follow-up  YWV:[id number]  Debbie [name] is a 38 y.o. female coming in with complaint of back and neck pain. OMT 06/13/2023. Patient states that she is the same as last visit. Still working with trainer and swimming/running. No pain with exercise.   Medications patient has been prescribed: Effexor   Taking:         Reviewed prior external information including notes and imaging from previsou exam, outside providers and external EMR if available.   As well as notes that were available from care everywhere and other healthcare systems.  Past medical history, social, surgical and family history all reviewed in electronic medical record.  No pertanent information unless stated regarding to the chief complaint.   Past Medical History:  Diagnosis Date   Abnormal heart rhythm    History of chicken pox    POTS (postural orthostatic tachycardia syndrome)     Allergies  Allergen Reactions   Chicken Allergy     Itching all over x 1 week   Gluten Meal      Review of Systems:  No headache, visual changes, nausea, vomiting, diarrhea, constipation, dizziness, abdominal pain, skin rash, fevers, chills, night sweats, weight loss, swollen lymph nodes, body aches, joint swelling, chest pain, shortness of breath, mood changes. POSITIVE muscle aches  Objective  Blood pressure 112/74, pulse 87, height 5' 5.5" (1.664 m), weight 125 lb (56.7 kg), SpO2 99%.   General: No apparent distress alert and oriented x3 mood and affect normal, dressed appropriately.  HEENT: Pupils equal, extraocular movements intact  Respiratory: Patient's speak in full sentences and does not appear short of breath  Cardiovascular: No lower  extremity edema, non tender, no erythema  Gait relatively normal MSK:  Back exam shows the patient does have some loss lordosis noted.  Tenderness to palpation of the paraspinal musculature.  Still pain over the coccyx area.  Osteopathic findings  C2 flexed rotated and side bent right C6 flexed rotated and side bent left T3 extended rotated and side bent right inhaled rib T9 extended rotated and side bent left L2 flexed rotated and side bent right Sacrum right on right    Assessment and Plan:  Coccydynia Continues to have chronic discomfort.  Discussed icing regimen of home exercises, discussed which activities to do and which ones to avoid.  Increase activity slowly.  Follow-up again in 6 to 8 weeks.    Nonallopathic problems  Decision today to treat with OMT was based on Physical Exam  After verbal consent patient was treated with HVLA, ME, FPR techniques in cervical, rib, thoracic, lumbar, and sacral  areas  Patient tolerated the procedure well with improvement in symptoms  Patient given exercises, stretches and lifestyle modifications  See medications in patient instructions if given  Patient will follow up in 4-8 weeks    The above documentation has been reviewed and is accurate and complete Luverta Korte M Angell Honse, DO          Note: This dictation was prepared with Dragon dictation along with smaller phrase technology. Any transcriptional errors that result from this process are unintentional.

## 2023-08-15 ENCOUNTER — Ambulatory Visit: Admitting: Family Medicine

## 2023-08-15 VITALS — BP 112/74 | HR 87 | Ht 65.5 in | Wt 125.0 lb

## 2023-08-15 DIAGNOSIS — M9901 Segmental and somatic dysfunction of cervical region: Secondary | ICD-10-CM

## 2023-08-15 DIAGNOSIS — M9902 Segmental and somatic dysfunction of thoracic region: Secondary | ICD-10-CM | POA: Diagnosis not present

## 2023-08-15 DIAGNOSIS — M9903 Segmental and somatic dysfunction of lumbar region: Secondary | ICD-10-CM | POA: Diagnosis not present

## 2023-08-15 DIAGNOSIS — M9908 Segmental and somatic dysfunction of rib cage: Secondary | ICD-10-CM

## 2023-08-15 DIAGNOSIS — M9904 Segmental and somatic dysfunction of sacral region: Secondary | ICD-10-CM | POA: Diagnosis not present

## 2023-08-15 DIAGNOSIS — M533 Sacrococcygeal disorders, not elsewhere classified: Secondary | ICD-10-CM

## 2023-08-15 NOTE — Assessment & Plan Note (Signed)
 Continues to have chronic discomfort.  Discussed icing regimen of home exercises, discussed which activities to do and which ones to avoid.  Increase activity slowly.  Follow-up again in 6 to 8 weeks.

## 2023-08-15 NOTE — Patient Instructions (Signed)
 Great movement See me in 2 months

## 2023-08-31 ENCOUNTER — Other Ambulatory Visit: Payer: Self-pay | Admitting: Family

## 2023-08-31 DIAGNOSIS — Z1231 Encounter for screening mammogram for malignant neoplasm of breast: Secondary | ICD-10-CM

## 2023-09-11 ENCOUNTER — Other Ambulatory Visit: Payer: Self-pay | Admitting: Family

## 2023-09-11 DIAGNOSIS — Z1231 Encounter for screening mammogram for malignant neoplasm of breast: Secondary | ICD-10-CM

## 2023-09-18 ENCOUNTER — Ambulatory Visit
Admission: RE | Admit: 2023-09-18 | Discharge: 2023-09-18 | Disposition: A | Discharge status: Home / Self Care | Source: Ambulatory Visit

## 2023-09-18 DIAGNOSIS — Z1231 Encounter for screening mammogram for malignant neoplasm of breast: Secondary | ICD-10-CM

## 2023-09-21 ENCOUNTER — Ambulatory Visit: Payer: Self-pay | Admitting: Family

## 2023-10-03 ENCOUNTER — Ambulatory Visit (INDEPENDENT_AMBULATORY_CARE_PROVIDER_SITE_OTHER): Admitting: Family Medicine

## 2023-10-03 VITALS — BP 128/88 | Ht 65.5 in | Wt 122.0 lb

## 2023-10-03 DIAGNOSIS — M533 Sacrococcygeal disorders, not elsewhere classified: Secondary | ICD-10-CM | POA: Diagnosis not present

## 2023-10-03 DIAGNOSIS — M9901 Segmental and somatic dysfunction of cervical region: Secondary | ICD-10-CM | POA: Diagnosis not present

## 2023-10-03 DIAGNOSIS — M9904 Segmental and somatic dysfunction of sacral region: Secondary | ICD-10-CM

## 2023-10-03 DIAGNOSIS — M9902 Segmental and somatic dysfunction of thoracic region: Secondary | ICD-10-CM

## 2023-10-03 DIAGNOSIS — M9903 Segmental and somatic dysfunction of lumbar region: Secondary | ICD-10-CM | POA: Diagnosis not present

## 2023-10-03 DIAGNOSIS — M9908 Segmental and somatic dysfunction of rib cage: Secondary | ICD-10-CM

## 2023-10-03 MED ORDER — PREDNISONE 20 MG PO TABS: 20.0000 mg | ORAL_TABLET | Freq: Every day | ORAL | 0 refills | Status: DC

## 2023-10-03 MED ORDER — TRAZODONE HCL 50 MG PO TABS: 50.0000 mg | ORAL_TABLET | Freq: Every evening | ORAL | 0 refills | Status: DC | PRN

## 2023-10-03 NOTE — Patient Instructions (Addendum)
 Prednisone  20mg  10 days Trazodone 50mg  at night when needed See you again in for nurse visit (Full dose cocktail) See you again in September

## 2023-10-03 NOTE — Progress Notes (Unsigned)
 [facility] [address] [place] [address] Phone: [phone] Subjective:   LILLETTE Berwyn Posey, am serving as a scribe for Dr. Arthea Claudene.  I'm seeing this patient by the request  of:  Daryl Setter, NP  CC: Low back pain follow-up  YEP:Dlagzrupcz  Jabria Copen is a 38 y.o. female coming in with complaint of back and neck pain. OMT 08/15/2023. Patient states that her trip to [place] is coming up in August. Worried about flight for 10 hours.  Patient notes sitting in uncomfortable positions for long amount of time seems to cause more discomfort and pain.  Medications patient has been prescribed: Effexor   Taking: Yes         Reviewed prior external information including notes and imaging from previsou exam, outside providers and external EMR if available.   As well as notes that were available from care everywhere and other healthcare systems.  Past medical history, social, surgical and family history all reviewed in electronic medical record.  No pertanent information unless stated regarding to the chief complaint.   Past Medical History:  Diagnosis Date   Abnormal heart rhythm    History of chicken pox    POTS (postural orthostatic tachycardia syndrome)     Allergies  Allergen Reactions   Chicken Allergy     Itching all over x 1 week   Gluten Meal      Review of Systems:  No headache, visual changes, nausea, vomiting, diarrhea, constipation, dizziness, abdominal pain, skin rash, fevers, chills, night sweats, weight loss, swollen lymph nodes, body aches, joint swelling, chest pain, shortness of breath, mood changes. POSITIVE muscle aches  Objective  Blood pressure 128/88, height 5' 5.5 (1.664 m), weight 122 lb (55.3 kg).   General: No apparent distress alert and oriented x3 mood and affect normal, dressed appropriately.  HEENT: Pupils equal, extraocular movements intact  Respiratory: Patient's speak in full  sentences and does not appear short of breath  Cardiovascular: No lower extremity edema, non tender, no erythema  Gait MSK:  Back does have some loss lordosis.  Some tenderness to palpation in the paraspinal musculature.  Significant tenderness still over the coccyx bone which is elongated  Osteopathic findings  C2 flexed rotated and side bent right C6 flexed rotated and side bent left T3 extended rotated and side bent right inhaled rib T9 extended rotated and side bent left L2 flexed rotated and side bent right L5 flexed rotated and side bent left Sacrum right on right       Assessment and Plan:  Coccydynia Continues to have significant difficulty.  This has been going on for years at this time.  We have been able to decrease some of the inflammation but still having difficulty.  Still wants to avoid surgical intervention.  Discussed potential Toradol  and Depo-Medrol  injection before she goes.  Discussed different medications such as prednisone  for treatment to decrease any inflammation.  Discussed medications that could help her sleep on the plane with her being very anxious about finding a comfortable position on the plane.  I feel when patient is in [place] she will do well and then we will see her when she comes back to evaluate any fallout.    Nonallopathic problems  Decision today to treat with OMT was based on Physical Exam  After verbal consent patient was treated with HVLA, ME, FPR techniques in cervical, rib, thoracic, lumbar, and sacral  areas  Patient tolerated the procedure well with improvement in  symptoms  Patient given exercises, stretches and lifestyle modifications  See medications in patient instructions if given  Patient will follow up in 4-8 weeks     The above documentation has been reviewed and is accurate and complete Garlen Reinig M Polette Nofsinger, DO         Note: This dictation was prepared with Dragon dictation along with smaller phrase technology. Any  transcriptional errors that result from this process are unintentional.

## 2023-10-04 ENCOUNTER — Encounter: Payer: Self-pay | Admitting: Family Medicine

## 2023-10-04 NOTE — Assessment & Plan Note (Addendum)
 Continues to have significant difficulty.  This has been going on for years at this time.  We have been able to decrease some of the inflammation but still having difficulty.  Still wants to avoid surgical intervention.  Discussed potential Toradol  and Depo-Medrol  injection before she goes.  Discussed different medications such as prednisone  for treatment to decrease any inflammation.  Discussed medications that could help her sleep on the plane with her being very anxious about finding a comfortable position on the plane.  I feel when patient is in [place] she will do well and then we will see her when she comes back to evaluate any fallout.  New medication prescribed was trazodone to help with potential sleep.

## 2023-10-16 ENCOUNTER — Ambulatory Visit: Admitting: Family Medicine

## 2023-10-25 ENCOUNTER — Other Ambulatory Visit: Payer: Self-pay | Admitting: Family Medicine

## 2023-11-17 ENCOUNTER — Ambulatory Visit (INDEPENDENT_AMBULATORY_CARE_PROVIDER_SITE_OTHER)

## 2023-11-17 DIAGNOSIS — M533 Sacrococcygeal disorders, not elsewhere classified: Secondary | ICD-10-CM | POA: Diagnosis not present

## 2023-11-17 MED ORDER — GABAPENTIN 100 MG PO CAPS: 200.0000 mg | ORAL_CAPSULE | Freq: Every day | ORAL | 0 refills | Status: DC

## 2023-11-17 MED ORDER — KETOROLAC TROMETHAMINE 60 MG/2ML IM SOLN
60.0000 mg | Freq: Once | INTRAMUSCULAR | Status: AC
  Administered 2023-11-17: 60 mg via INTRAMUSCULAR

## 2023-11-17 MED ORDER — METHYLPREDNISOLONE ACETATE 80 MG/ML IJ SUSP
80.0000 mg | Freq: Once | INTRAMUSCULAR | Status: AC
  Administered 2023-11-17: 80 mg via INTRAMUSCULAR

## 2023-11-17 NOTE — Progress Notes (Signed)
 Patient received Toradol  60 mg in left buttocks and Methylprednisolone  80 mg in right buttocks. Patient tolerated well. Patient request refill of gabapentin . Refilled a 30 day supply to confirmed pharmacy.

## 2023-11-30 NOTE — Progress Notes (Signed)
  [name] [address] [place] [address] Phone: [phone] Subjective:   ISusannah Gully, am serving as a scribe for Dr. Arthea Claudene.   I'm seeing this patient by the request  of:  Daryl Setter, NP  CC: Back and neck pain follow-up  YEP:Dlagzrupcz  Naelani Cryan is a 38 y.o. female coming in with complaint of back and neck pain. OMT 10/03/2023. Patient states doing well overall. No new symptoms or concerns.  Medications patient has been prescribed: Gabapentin , Effexor   Taking: Yes         Reviewed prior external information including notes and imaging from previsou exam, outside providers and external EMR if available.   As well as notes that were available from care everywhere and other healthcare systems.  Past medical history, social, surgical and family history all reviewed in electronic medical record.  No pertanent information unless stated regarding to the chief complaint.   Past Medical History:  Diagnosis Date   Abnormal heart rhythm    History of chicken pox    POTS (postural orthostatic tachycardia syndrome)     Allergies  Allergen Reactions   Chicken Allergy     Itching all over x 1 week   Gluten Meal      Review of Systems:  No headache, visual changes, nausea, vomiting, diarrhea, constipation, dizziness, abdominal pain, skin rash, fevers, chills, night sweats, weight loss, swollen lymph nodes, body aches, joint swelling, chest pain, shortness of breath, mood changes. POSITIVE muscle aches  Objective  Blood pressure 102/74, pulse 78, height 5' 5 (1.651 m), weight 124 lb (56.2 kg), SpO2 98%.   General: No apparent distress alert and oriented x3 mood and affect normal, dressed appropriately.  HEENT: Pupils equal, extraocular movements intact  Respiratory: Patient's speak in full sentences and does not appear short of breath  Cardiovascular: No lower extremity edema, non tender, no erythema   Gait MSK:  Back does have some mild hypomobility noted.  Osteopathic findings  C2 flexed rotated and side bent left C7 flexed rotated and side bent left T3 extended rotated and side bent right inhaled rib T9 extended rotated and side bent left L2 flexed rotated and side bent right L5 flexed rotated and side bent left Sacrum right on right       Assessment and Plan:  Coccydynia Discussed with patient that icing regimen and home exercises, discussed which activities to do and which ones to avoid.  Patient likely did do very well on a trip overseas which she does do mind in her, finger.  Discussed icing regimen.    Nonallopathic problems  Decision today to treat with OMT was based on Physical Exam  After verbal consent patient was treated with HVLA, ME, FPR techniques in cervical, rib, thoracic, lumbar, and sacral  areas  Patient tolerated the procedure well with improvement in symptoms  Patient given exercises, stretches and lifestyle modifications  See medications in patient instructions if given  Patient will follow up in 4-8 weeks    The above documentation has been reviewed and is accurate and complete Pearley Millington M Aneka Fagerstrom, DO          Note: This dictation was prepared with Dragon dictation along with smaller phrase technology. Any transcriptional errors that result from this process are unintentional.

## 2023-12-05 ENCOUNTER — Ambulatory Visit (INDEPENDENT_AMBULATORY_CARE_PROVIDER_SITE_OTHER): Admitting: Family Medicine

## 2023-12-05 ENCOUNTER — Encounter: Payer: Self-pay | Admitting: Family Medicine

## 2023-12-05 VITALS — BP 102/74 | HR 78 | Ht 65.0 in | Wt 124.0 lb

## 2023-12-05 DIAGNOSIS — M9908 Segmental and somatic dysfunction of rib cage: Secondary | ICD-10-CM | POA: Diagnosis not present

## 2023-12-05 DIAGNOSIS — M9901 Segmental and somatic dysfunction of cervical region: Secondary | ICD-10-CM | POA: Diagnosis not present

## 2023-12-05 DIAGNOSIS — M533 Sacrococcygeal disorders, not elsewhere classified: Secondary | ICD-10-CM

## 2023-12-05 DIAGNOSIS — M9903 Segmental and somatic dysfunction of lumbar region: Secondary | ICD-10-CM | POA: Diagnosis not present

## 2023-12-05 DIAGNOSIS — M9902 Segmental and somatic dysfunction of thoracic region: Secondary | ICD-10-CM | POA: Diagnosis not present

## 2023-12-05 DIAGNOSIS — M9904 Segmental and somatic dysfunction of sacral region: Secondary | ICD-10-CM

## 2023-12-05 NOTE — Patient Instructions (Signed)
 Glad everything went well Keep up with strength training See me in 2 months

## 2023-12-05 NOTE — Assessment & Plan Note (Signed)
 Discussed with patient that icing regimen and home exercises, discussed which activities to do and which ones to avoid.  Patient likely did do very well on a trip overseas which she does do mind in her, finger.  Discussed icing regimen.

## 2024-02-01 NOTE — Progress Notes (Signed)
 [facility] [address] [place] [address] Phone: [phone] Subjective:   Debbie [name], am serving as a scribe for Dr. Arthea Claudene. 125lb I'm seeing this patient by the request  of:  Daryl Setter, NP  CC: Low back and neck pain follow-up  YEP:Dlagzrupcz  Debbie [name] is a 38 y.o. female coming in with complaint of back and neck pain. OMT on 12/05/2023. Patient states that she is doing the same. Still some discomfort noted but nothing that stopping her.  Swimming and weightlifting.  Medications patient has been prescribed: gabapentin , Celebrex   Taking: Intermittently would like refill on gabapentin  to have on hand         Reviewed prior external information including notes and imaging from previsou exam, outside providers and external EMR if available.   As well as notes that were available from care everywhere and other healthcare systems.  Past medical history, social, surgical and family history all reviewed in electronic medical record.  No pertanent information unless stated regarding to the chief complaint.   Past Medical History:  Diagnosis Date   Abnormal heart rhythm    History of chicken pox    POTS (postural orthostatic tachycardia syndrome)     Allergies  Allergen Reactions   Chicken Allergy     Itching all over x 1 week   Gluten Meal      Review of Systems:  No headache, visual changes, nausea, vomiting, diarrhea, constipation, dizziness, abdominal pain, skin rash, fevers, chills, night sweats, weight loss, swollen lymph nodes, body aches, joint swelling, chest pain, shortness of breath, mood changes. POSITIVE muscle aches  Objective  Blood pressure 108/72, pulse 61, height 5' 5 (1.651 m), weight 125 lb (56.7 kg).   General: No apparent distress alert and oriented x3 mood and affect normal, dressed appropriately.  HEENT: Pupils equal, extraocular movements intact  Respiratory: Patient's speak  in full sentences and does not appear short of breath  Cardiovascular: No lower extremity edema, non tender, no erythema  Gait MSK:  Back some hypermobility noted.  Some tightness noted in the paraspinal musculature.  Still some pain in the coccydynia  Osteopathic findings  C2 flexed rotated and side bent right C6 flexed rotated and side bent left T3 extended rotated and side bent right inhaled rib T9 extended rotated and side bent left L2 flexed rotated and side bent right Sacrum right on right       Assessment and Plan:  SI (sacroiliac) joint dysfunction Coccydynia and sacroiliac dysfunction but it seems to be getting better with patient strengthening at this time.  Has done significant amounts of physical therapy and has significantly more stability.  Continue with this as well as has responded well to osteopathic manipulation.  Refilled gabapentin  at previous dosing.  Follow-up again in 6 to 8 weeks    Nonallopathic problems  Decision today to treat with OMT was based on Physical Exam  After verbal consent patient was treated with HVLA, ME, FPR techniques in cervical, rib, thoracic, lumbar, and sacral  areas  Patient tolerated the procedure well with improvement in symptoms  Patient given exercises, stretches and lifestyle modifications  See medications in patient instructions if given  Patient will follow up in 4-8 weeks     The above documentation has been reviewed and is accurate and complete Debbie [name] M Evelyn Aguinaldo, DO         Note: This dictation was prepared with Dragon dictation along with smaller phrase technology. Any  transcriptional errors that result from this process are unintentional.

## 2024-02-05 ENCOUNTER — Ambulatory Visit: Admitting: Family Medicine

## 2024-02-05 ENCOUNTER — Encounter: Payer: Self-pay | Admitting: Family Medicine

## 2024-02-05 VITALS — BP 108/72 | HR 61 | Ht 65.0 in | Wt 125.0 lb

## 2024-02-05 DIAGNOSIS — M533 Sacrococcygeal disorders, not elsewhere classified: Secondary | ICD-10-CM

## 2024-02-05 DIAGNOSIS — M9901 Segmental and somatic dysfunction of cervical region: Secondary | ICD-10-CM | POA: Diagnosis not present

## 2024-02-05 DIAGNOSIS — M9903 Segmental and somatic dysfunction of lumbar region: Secondary | ICD-10-CM

## 2024-02-05 DIAGNOSIS — M9904 Segmental and somatic dysfunction of sacral region: Secondary | ICD-10-CM | POA: Diagnosis not present

## 2024-02-05 DIAGNOSIS — M9902 Segmental and somatic dysfunction of thoracic region: Secondary | ICD-10-CM

## 2024-02-05 DIAGNOSIS — M9908 Segmental and somatic dysfunction of rib cage: Secondary | ICD-10-CM | POA: Diagnosis not present

## 2024-02-05 MED ORDER — GABAPENTIN 100 MG PO CAPS: 200.0000 mg | ORAL_CAPSULE | Freq: Every day | ORAL | 0 refills | Status: AC

## 2024-02-05 NOTE — Assessment & Plan Note (Signed)
 Coccydynia and sacroiliac dysfunction but it seems to be getting better with patient strengthening at this time.  Has done significant amounts of physical therapy and has significantly more stability.  Continue with this as well as has responded well to osteopathic manipulation.  Refilled gabapentin  at previous dosing.  Follow-up again in 6 to 8 weeks

## 2024-02-05 NOTE — Patient Instructions (Addendum)
 Good to see you! Refilled Gabapentin  See you again in 2 months

## 2024-02-13 ENCOUNTER — Encounter: Payer: Self-pay | Admitting: Family

## 2024-02-13 ENCOUNTER — Ambulatory Visit: Payer: No Typology Code available for payment source | Admitting: Family

## 2024-02-13 ENCOUNTER — Other Ambulatory Visit (HOSPITAL_COMMUNITY)
Admission: RE | Admit: 2024-02-13 | Discharge: 2024-02-13 | Disposition: A | Discharge status: Home / Self Care | Source: Ambulatory Visit | Attending: Family | Admitting: Family

## 2024-02-13 VITALS — BP 121/81 | HR 73 | Temp 98.3°F | Resp 16 | Ht 66.5 in | Wt 126.0 lb

## 2024-02-13 DIAGNOSIS — Z01419 Encounter for gynecological examination (general) (routine) without abnormal findings: Secondary | ICD-10-CM | POA: Insufficient documentation

## 2024-02-13 DIAGNOSIS — R400 Somnolence: Secondary | ICD-10-CM

## 2024-02-13 DIAGNOSIS — Z0000 Encounter for general adult medical examination without abnormal findings: Secondary | ICD-10-CM

## 2024-02-13 DIAGNOSIS — G90A Postural orthostatic tachycardia syndrome (POTS): Secondary | ICD-10-CM

## 2024-02-13 DIAGNOSIS — Z1272 Encounter for screening for malignant neoplasm of vagina: Secondary | ICD-10-CM | POA: Insufficient documentation

## 2024-02-13 DIAGNOSIS — E785 Hyperlipidemia, unspecified: Secondary | ICD-10-CM | POA: Diagnosis not present

## 2024-02-13 DIAGNOSIS — M533 Sacrococcygeal disorders, not elsewhere classified: Secondary | ICD-10-CM

## 2024-02-13 DIAGNOSIS — R140 Abdominal distension (gaseous): Secondary | ICD-10-CM

## 2024-02-13 LAB — LIPID PANEL
Cholesterol: 236 mg/dL — ABNORMAL HIGH (ref 0–200)
HDL: 96.3 mg/dL (ref >39.00)
LDL Cholesterol: 124 mg/dL — ABNORMAL HIGH (ref 0–99)
NonHDL: 140.01
Total CHOL/HDL Ratio: 2
Triglycerides: 78 mg/dL (ref 0.0–149.0)
VLDL: 15.6 mg/dL (ref 0.0–40.0)

## 2024-02-13 LAB — COMPREHENSIVE METABOLIC PANEL WITH GFR
ALT: 15 U/L (ref 0–35)
AST: 21 U/L (ref 0–37)
Albumin: 4.7 g/dL (ref 3.5–5.2)
Alkaline Phosphatase: 65 U/L (ref 39–117)
BUN: 19 mg/dL (ref 6–23)
CO2: 23 meq/L (ref 19–32)
Calcium: 9 mg/dL (ref 8.4–10.5)
Chloride: 103 meq/L (ref 96–112)
Creatinine, Ser: 0.77 mg/dL (ref 0.40–1.20)
GFR: 96.47 mL/min (ref >60.00)
Glucose, Bld: 84 mg/dL (ref 70–99)
Potassium: 4.2 meq/L (ref 3.5–5.1)
Sodium: 136 meq/L (ref 135–145)
Total Bilirubin: 0.8 mg/dL (ref 0.2–1.2)
Total Protein: 7 g/dL (ref 6.0–8.3)

## 2024-02-13 LAB — TSH: TSH: 2.09 u[IU]/mL (ref 0.35–5.50)

## 2024-02-13 NOTE — Assessment & Plan Note (Signed)
?   Related to POTS, has never had sleep study- will refer to pulmonary for sleep study.

## 2024-02-13 NOTE — Assessment & Plan Note (Signed)
 Overall well controlled

## 2024-02-13 NOTE — Assessment & Plan Note (Signed)
 Adult Wellness Visit Wellness visit with ongoing health concerns discussed. Discussed HPV vaccination series. - Performed Pap smear. - Ordered thyroid function test. - Ordered lipid panel. - Discussed HPV vaccination series. She wishes to think about it.

## 2024-02-13 NOTE — Assessment & Plan Note (Signed)
 This  has been better with some dietary changes.

## 2024-02-13 NOTE — Patient Instructions (Signed)
  VISIT SUMMARY: Today, you came in for your annual physical exam and Pap smear update. We discussed your chronic nerve pain, chronic fatigue, and other ongoing health concerns. You are maintaining a healthy lifestyle with regular exercise and strength training. We also reviewed your current medications and past treatments.  YOUR PLAN: -ADULT WELLNESS VISIT: We performed your Pap smear and ordered tests to check your thyroid function and cholesterol levels. We also discussed the benefits of the HPV vaccination series.  -CHRONIC FATIGUE AND HYPERSOMNIA: Chronic fatigue and hypersomnia can make you feel extremely tired and sleepy. We agreed to further evaluate this by referring you to a sleep specialist for a home sleep study.  -POSTURAL ORTHOSTATIC TACHYCARDIA SYNDROME (POTS): POTS is a condition that affects blood flow and can cause dizziness and rapid heartbeat when standing up. We discussed that your symptoms might be related to hypermobility and potential sleep apnea. You are referred to a sleep specialist for a home sleep study.  -ABDOMINAL BLOATING: You experience occasional abdominal bloating, which has improved with dietary changes. Continue with your current dietary modifications.  -HYPERLIPIDEMIA: Hyperlipidemia means you have high cholesterol levels. We ordered a lipid panel to check your current cholesterol levels.  -GENERAL HEALTH MAINTENANCE: You are up to date on your flu vaccination. We discussed the benefits of the HPV vaccination series.  INSTRUCTIONS: Please follow up with the sleep specialist for your home sleep study. We will contact you with the results of your thyroid function test and lipid panel once they are available.

## 2024-02-13 NOTE — Assessment & Plan Note (Signed)
 This is being managed by sports medicine and has improved overall.

## 2024-02-13 NOTE — Progress Notes (Signed)
 Established Patient Office Visit  Patient ID: Debbie [name], female    DOB: Jun 19, 1985  Age: 38 y.o. MRN: [id number] PCP: Daryl Setter, NP  Chief Complaint  Patient presents with   Annual Exam    Patient is here for her annual physical    Subjective:     HPI  Discussed the use of AI scribe software for clinical note transcription with the patient, who gave verbal consent to proceed.  History of Present Illness    Debbie [name] is a 38 year old female who presents for an annual physical exam and Pap smear update.  She experiences chronic sacral nerve pain, which she manages by avoiding prolonged sitting. During a trip to [place] in August, she required prednisone  for pain management. She is currently taking Effexor  and has tried various treatments in the past without significant relief.  She experiences chronic fatigue and wonders if it is related to her hypermobility or POTS. 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 No fainting or heart palpitations are present, and she notes that food does not sustain her energy for long. 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  She maintains a healthy lifestyle, running or swimming two to three times a week and engaging in strength training due to hypermobility. She finds strength training beneficial. She does not experience fainting or heart palpitations. She has no concerns about mood, depression, or anxiety, describing herself as naturally cheerful despite fatigue.  No current cough, cold symptoms, skin rash, hearing, vision, urinary, or digestive concerns, though she mentions occasional bloating managed by dietary adjustments. She reports regular menstrual periods and no frequent headaches. She is not currently using birth control due to worsening POTS symptoms in the past.  She denies alcohol,  drug, tobacco, or vaping use. There have been no recent surgeries and no changes in family medical history.     Review of Systems  Constitutional:  Positive for malaise/fatigue.  HENT:  Negative for congestion and hearing loss.   Eyes:  Negative for blurred vision.  Respiratory:  Negative for cough.   Cardiovascular:  Negative for leg swelling.  Gastrointestinal:  Negative for constipation and diarrhea.  Genitourinary:  Negative for dysuria and hematuria.  Musculoskeletal:        Sacrodynia  Skin:  Negative for rash.       Dry skin  Neurological:  Negative for headaches.  Psychiatric/Behavioral:         Denies depression/anxiety      Objective:     BP 121/81 (BP Location: Right Arm, Patient Position: Sitting, Cuff Size: Normal)   Pulse 73   Temp 98.3 F (36.8 C) (Oral)   Resp 16   Ht 5' 6.5 (1.689 m)   Wt 126 lb (57.2 kg)   SpO2 100%   BMI 20.03 kg/m    Physical Exam  No results found for any visits on 02/13/24.    The 10-year ASCVD risk score (Arnett DK, et al., 2019) is: 0.3% Physical Exam  Constitutional: She is oriented to person, place, and time. She appears well-developed and well-nourished. No distress.  HENT:  Head: Normocephalic and atraumatic.  Right Ear: Tympanic membrane and ear canal normal.  Left Ear: Tympanic membrane and ear canal normal.  Mouth/Throat: Oropharynx is clear and moist.  Eyes: Pupils are equal, round, and reactive to light. No scleral icterus.  Neck: Normal range of motion. No thyromegaly  present.  Cardiovascular: Normal rate and regular rhythm.   No murmur heard. Pulmonary/Chest: Effort normal and breath sounds normal. No respiratory distress. He has no wheezes. She has no rales. She exhibits no tenderness.  Abdominal: Soft. Bowel sounds are normal. She exhibits no distension and no mass. There is no tenderness. There is no rebound and no guarding.  Musculoskeletal: She exhibits no edema.  Lymphadenopathy:    She has no  cervical adenopathy.  Neurological: She is alert and oriented to person, place, and time. She has normal patellar reflexes. She exhibits normal muscle tone. Coordination normal.  Skin: Skin is warm and dry.  Psychiatric: She has a normal mood and affect. Her behavior is normal. Judgment and thought content normal.  Breasts: Examined lying Right: Without masses, retractions, discharge or axillary adenopathy.  Left: Without masses, retractions, discharge or axillary adenopathy.  Inguinal/mons: Normal without inguinal adenopathy  External genitalia: Normal (chaperone present) BUS/Urethra/Skene's glands: Normal  Bladder: Normal  Vagina: Normal  Cervix: Normal  Uterus: normal in size, shape and contour. Midline and mobile  Adnexa/parametria:  Rt: Without masses or tenderness.  Lt: Without masses or tenderness.  Anus and perineum: Normal            Assessment & Plan:      Assessment & Plan:   Problem List Items Addressed This Visit       Unprioritized   Preventative health care   Adult Wellness Visit Wellness visit with ongoing health concerns discussed. Discussed HPV vaccination series. - Performed Pap smear. - Ordered thyroid function test. - Ordered lipid panel. - Discussed HPV vaccination series. She wishes to think about it.        POTS (postural orthostatic tachycardia syndrome)   Overall well controlled.       Daytime somnolence - Primary   ? Related to POTS, has never had sleep study- will refer to pulmonary for sleep study.       Relevant Orders   Ambulatory referral to Pulmonology   TSH   Coccydynia   This is being managed by sports medicine and has improved overall.       Abdominal bloating   This  has been better with some dietary changes.       Other Visit Diagnoses       Encounter for Papanicolaou smear of vagina as part of routine gynecological examination       Relevant Orders   Cytology - PAP( [place])     Hyperlipidemia,  unspecified hyperlipidemia type       Relevant Orders   Comp Met ([facility])   Lipid panel          Return in about 1 year (around 02/12/2025) for annual physical.    Debbie GORMAN Ponto, NP [facility]

## 2024-02-14 ENCOUNTER — Ambulatory Visit: Payer: Self-pay | Admitting: Family

## 2024-02-14 LAB — CYTOLOGY - PAP
Comment: NEGATIVE
Diagnosis: NEGATIVE
High risk HPV: NEGATIVE

## 2024-03-21 ENCOUNTER — Encounter: Payer: Self-pay | Admitting: Family Medicine

## 2024-03-21 ENCOUNTER — Ambulatory Visit: Admitting: Family Medicine

## 2024-03-21 VITALS — BP 96/62 | HR 84 | Ht 66.5 in

## 2024-03-21 DIAGNOSIS — M9903 Segmental and somatic dysfunction of lumbar region: Secondary | ICD-10-CM | POA: Diagnosis not present

## 2024-03-21 DIAGNOSIS — M9901 Segmental and somatic dysfunction of cervical region: Secondary | ICD-10-CM

## 2024-03-21 DIAGNOSIS — M533 Sacrococcygeal disorders, not elsewhere classified: Secondary | ICD-10-CM

## 2024-03-21 DIAGNOSIS — M9908 Segmental and somatic dysfunction of rib cage: Secondary | ICD-10-CM | POA: Diagnosis not present

## 2024-03-21 DIAGNOSIS — M9902 Segmental and somatic dysfunction of thoracic region: Secondary | ICD-10-CM | POA: Diagnosis not present

## 2024-03-21 DIAGNOSIS — M9904 Segmental and somatic dysfunction of sacral region: Secondary | ICD-10-CM

## 2024-03-21 NOTE — Patient Instructions (Signed)
 Monitor wrist See me in 2 months

## 2024-03-21 NOTE — Assessment & Plan Note (Signed)
 Secondary to patient's hypermobility.  Discussed icing regimen and home exercises.  Discussed which active acetaminophen  is no blood.  Increase activity slowly.  Follow-up again in 6 to 12 weeks.

## 2024-03-21 NOTE — Progress Notes (Signed)
°  Debbie [name] [address] [place] [address] Phone: [phone] Subjective:   Debbie [name], am serving as a scribe for Debbie [name].  I'm seeing this patient by the request  of:  Debbie Setter, NP  CC: Neck and back pain follow-up  YEP:Dlagzrupcz  Debbie [name] is a 38 y.o. female coming in with complaint of back and neck pain. OMT 02/05/2024. Patient states that she is the same as last visit.   Medications patient has been prescribed: Effexor   Taking:         Reviewed prior external information including notes and imaging from previsou exam, outside providers and external EMR if available.   As well as notes that were available from care everywhere and other healthcare systems.  Past medical history, social, surgical and family history all reviewed in electronic medical record.  No pertanent information unless stated regarding to the chief complaint.   Past Medical History:  Diagnosis Date   Abnormal heart rhythm    History of chicken pox    POTS (postural orthostatic tachycardia syndrome)     Allergies[1]   Review of Systems:  No headache, visual changes, nausea, vomiting, diarrhea, constipation, dizziness, abdominal pain, skin rash, fevers, chills, night sweats, weight loss, swollen lymph nodes, body aches, joint swelling, chest pain, shortness of breath, mood changes. POSITIVE muscle aches  Objective  Blood pressure 96/62, pulse 84, height 5' 6.5 (1.689 m), SpO2 98%.   General: No apparent distress alert and oriented x3 mood and affect normal, dressed appropriately.  HEENT: Pupils equal, extraocular movements intact  Respiratory: Patient's speak in full sentences and does not appear short of breath  Cardiovascular: No lower extremity edema, non tender, no erythema  Gait relatively normal MSK:  Back tenderness in the parascapular area.  Some decrease minorly in rotation compared to patient's usual  hypermobility.  Some discomfort noted of the right breast worse.  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  Discussed icing regimen and home exercises.  Discussed which active acetaminophen  is no blood.  Increase activity slowly.  Follow-up again in 6 to 12 weeks.    Nonallopathic problems  Decision today to treat with OMT was based on Physical Exam  After verbal consent patient was treated with HVLA, ME, FPR techniques in cervical, rib, thoracic, lumbar, and sacral  areas  Patient tolerated the procedure well with improvement in symptoms  Patient given exercises, stretches and lifestyle modifications  See medications in patient instructions if given  Patient will follow up in 4-8 weeks    The above documentation has been reviewed and is accurate and complete Debbie Clayson M Slyvester Latona, DO          Note: This dictation was prepared with Dragon dictation along with smaller phrase technology. Any transcriptional errors that result from this process are unintentional.            [1]  Allergies Allergen Reactions   Chicken Allergy     Itching all over x 1 week   Gluten Meal

## 2024-03-29 ENCOUNTER — Other Ambulatory Visit: Payer: Self-pay | Admitting: Family Medicine

## 2024-04-03 ENCOUNTER — Ambulatory Visit: Admitting: Family Medicine

## 2024-04-29 ENCOUNTER — Ambulatory Visit: Admitting: Primary Care

## 2024-05-27 ENCOUNTER — Ambulatory Visit: Admitting: Family Medicine

## 2025-02-18 ENCOUNTER — Encounter: Admitting: Family
